# Patient Record
Sex: Male | Born: 1950 | Race: White | Hispanic: No | Marital: Single | Smoking: Former smoker
Health system: Southern US, Community
[De-identification: ages and names within clinical notes are randomized; demographics above are authoritative.]

## PROBLEM LIST (undated history)

## (undated) DIAGNOSIS — C801 Malignant (primary) neoplasm, unspecified: Secondary | ICD-10-CM

## (undated) DIAGNOSIS — I10 Essential (primary) hypertension: Secondary | ICD-10-CM

## (undated) DIAGNOSIS — Z972 Presence of dental prosthetic device (complete) (partial): Secondary | ICD-10-CM

## (undated) DIAGNOSIS — D473 Essential (hemorrhagic) thrombocythemia: Secondary | ICD-10-CM

## (undated) DIAGNOSIS — R251 Tremor, unspecified: Secondary | ICD-10-CM

## (undated) DIAGNOSIS — D75839 Thrombocytosis, unspecified: Secondary | ICD-10-CM

## (undated) DIAGNOSIS — N400 Enlarged prostate without lower urinary tract symptoms: Secondary | ICD-10-CM

## (undated) DIAGNOSIS — R591 Generalized enlarged lymph nodes: Secondary | ICD-10-CM

## (undated) DIAGNOSIS — D649 Anemia, unspecified: Principal | ICD-10-CM

## (undated) DIAGNOSIS — D72820 Lymphocytosis (symptomatic): Secondary | ICD-10-CM

## (undated) DIAGNOSIS — C9110 Chronic lymphocytic leukemia of B-cell type not having achieved remission: Secondary | ICD-10-CM

## (undated) HISTORY — DX: Anemia, unspecified: D64.9

## (undated) HISTORY — PX: TONSILLECTOMY: SUR1361

## (undated) HISTORY — DX: Malignant (primary) neoplasm, unspecified: C80.1

---

## 1988-04-01 HISTORY — PX: FRACTURE SURGERY: SHX138

## 1990-04-01 HISTORY — PX: HERNIA REPAIR: SHX51

## 1993-04-01 HISTORY — PX: ORIF ANKLE FRACTURE: SUR919

## 2009-11-30 ENCOUNTER — Emergency Department (HOSPITAL_COMMUNITY): Admission: EM | Admit: 2009-11-30 | Discharge: 2009-11-30 | Payer: Self-pay | Admitting: Family Medicine

## 2012-05-30 HISTORY — PX: PORTACATH PLACEMENT: SHX2246

## 2012-06-08 ENCOUNTER — Emergency Department (HOSPITAL_COMMUNITY): Payer: Medicaid Other

## 2012-06-08 ENCOUNTER — Emergency Department (HOSPITAL_COMMUNITY)
Admission: EM | Admit: 2012-06-08 | Discharge: 2012-06-08 | Disposition: A | Discharge status: Other Acute Inpatient Hospital | Payer: Medicaid Other | Attending: Emergency Medicine | Admitting: Emergency Medicine

## 2012-06-08 ENCOUNTER — Encounter (HOSPITAL_COMMUNITY): Payer: Self-pay | Admitting: *Deleted

## 2012-06-08 DIAGNOSIS — R3915 Urgency of urination: Secondary | ICD-10-CM | POA: Insufficient documentation

## 2012-06-08 DIAGNOSIS — Z87891 Personal history of nicotine dependence: Secondary | ICD-10-CM | POA: Insufficient documentation

## 2012-06-08 DIAGNOSIS — R059 Cough, unspecified: Secondary | ICD-10-CM | POA: Insufficient documentation

## 2012-06-08 DIAGNOSIS — C9590 Leukemia, unspecified not having achieved remission: Secondary | ICD-10-CM | POA: Insufficient documentation

## 2012-06-08 DIAGNOSIS — D649 Anemia, unspecified: Secondary | ICD-10-CM | POA: Insufficient documentation

## 2012-06-08 DIAGNOSIS — D696 Thrombocytopenia, unspecified: Secondary | ICD-10-CM | POA: Insufficient documentation

## 2012-06-08 LAB — COMPREHENSIVE METABOLIC PANEL
ALT: 11 U/L (ref 0–53)
AST: 31 U/L (ref 0–37)
Albumin: 4.3 g/dL (ref 3.5–5.2)
Alkaline Phosphatase: 104 U/L (ref 39–117)
BUN: 15 mg/dL (ref 6–23)
CO2: 24 mEq/L (ref 19–32)
Calcium: 9 mg/dL (ref 8.4–10.5)
Chloride: 105 mEq/L (ref 96–112)
Creatinine, Ser: 1.32 mg/dL (ref 0.50–1.35)
GFR calc Af Amer: 66 mL/min — ABNORMAL LOW (ref >90)
GFR calc non Af Amer: 57 mL/min — ABNORMAL LOW (ref >90)
Glucose, Bld: 70 mg/dL (ref 70–99)
Potassium: 5.2 mEq/L — ABNORMAL HIGH (ref 3.5–5.1)
Sodium: 143 mEq/L (ref 135–145)
Total Bilirubin: 1.3 mg/dL — ABNORMAL HIGH (ref 0.3–1.2)
Total Protein: 6.7 g/dL (ref 6.0–8.3)

## 2012-06-08 LAB — CBC WITH DIFFERENTIAL/PLATELET
Band Neutrophils: 0 % (ref 0–10)
Basophils Absolute: 0 10*3/uL (ref 0.0–0.1)
Basophils Relative: 0 % (ref 0–1)
Blasts: 46 %
Eosinophils Absolute: 16.7 10*3/uL — ABNORMAL HIGH (ref 0.0–0.7)
Eosinophils Relative: 2 % (ref 0–5)
HCT: 17.2 % — ABNORMAL LOW (ref 39.0–52.0)
Lymphocytes Relative: 50 % — ABNORMAL HIGH (ref 12–46)
Lymphs Abs: 416.4 10*3/uL — ABNORMAL HIGH (ref 0.7–4.0)
MCV: 165.4 fL — ABNORMAL HIGH (ref 78.0–100.0)
Metamyelocytes Relative: 0 %
Monocytes Absolute: 0 10*3/uL — ABNORMAL LOW (ref 0.1–1.0)
Monocytes Relative: 0 % — ABNORMAL LOW (ref 3–12)
Myelocytes: 0 %
Neutro Abs: 16.7 10*3/uL — ABNORMAL HIGH (ref 1.7–7.7)
Neutrophils Relative %: 2 % — ABNORMAL LOW (ref 43–77)
Platelets: 121 10*3/uL — ABNORMAL LOW (ref 150–400)
Promyelocytes Absolute: 0 %
RBC: 1.04 MIL/uL — ABNORMAL LOW (ref 4.22–5.81)
WBC: 832.8 10*3/uL (ref 4.0–10.5)
nRBC: 0 /100 WBC

## 2012-06-08 LAB — URINALYSIS, ROUTINE W REFLEX MICROSCOPIC
Bilirubin Urine: NEGATIVE
Glucose, UA: NEGATIVE mg/dL
Ketones, ur: NEGATIVE mg/dL
Leukocytes, UA: NEGATIVE
Nitrite: NEGATIVE
Protein, ur: 100 mg/dL — AB
Specific Gravity, Urine: 1.025 (ref 1.005–1.030)
Urobilinogen, UA: 0.2 mg/dL (ref 0.0–1.0)
pH: 5.5 (ref 5.0–8.0)

## 2012-06-08 LAB — APTT: aPTT: 32 seconds (ref 24–37)

## 2012-06-08 LAB — PRO B NATRIURETIC PEPTIDE: Pro B Natriuretic peptide (BNP): 1318 pg/mL — ABNORMAL HIGH (ref 0–125)

## 2012-06-08 LAB — URINE MICROSCOPIC-ADD ON

## 2012-06-08 LAB — OCCULT BLOOD, POC DEVICE: Fecal Occult Bld: POSITIVE — AB

## 2012-06-08 LAB — PROTIME-INR
INR: 1.31 (ref 0.00–1.49)
Prothrombin Time: 16 seconds — ABNORMAL HIGH (ref 11.6–15.2)

## 2012-06-08 LAB — TROPONIN I: Troponin I: <0.3 ng/mL (ref <0.30)

## 2012-06-08 MED ORDER — IOHEXOL 350 MG/ML SOLN
120.0000 mL | Freq: Once | INTRAVENOUS | Status: AC | PRN
  Administered 2012-06-08: 120 mL via INTRAVENOUS

## 2012-06-08 NOTE — ED Notes (Addendum)
Swelling both sides of neck for 4 mos,  Now has a lump  Lt axilla,   Sob, frequency of urination when tries to lie down.  Swelling of lt foot. abd appears distended,

## 2012-06-08 NOTE — ED Provider Notes (Signed)
History     This chart was scribed for Hadas Jessop B. Bernette Mayers, MD, MD by Smitty Pluck, ED Scribe. The patient was seen in room APA08/APA08 and the patient's care was started at 4:33PM.   CSN: [id number]  Arrival date & time 06/08/12  1408      Chief Complaint  Patient presents with  . Shortness of Breath    The history is provided by the patient and medical records. No language interpreter was used.   Tim Duncan is a 62 y.o. male who presents to the Emergency Department complaining of swollen glands in neck onset 2 months and in bilateral axilla ago 3-4 days ago. He reports that he has had SOB today. SOB is worse when he lays down. Pt states that he has nonproductive cough. He mentions having bilateral leg swelling that has been ongoing for over 5 months but symptoms have worsened. Pt reports that he has not been to PCP since 2010. He reports having increased urinary urgency at night when he gets in the bed. Pt denies fever, chills, nausea, vomiting, diarrhea, weakness and any other pain. He states that he has lost weight over the past couple of months. He reports hx of hernia surgery in 1990s. He denies drinking alcohol and smoking cigarettes.    History reviewed. No pertinent past medical history.  Past Surgical History  Procedure Laterality Date  . Hernia repair    . Fracture surgery      History reviewed. No pertinent family history.  History  Substance Use Topics  . Smoking status: Former Games developer  . Smokeless tobacco: Not on file  . Alcohol Use: No      Review of Systems 10 Systems reviewed and all are negative for acute change except as noted in the HPI.   Allergies  Review of patient's allergies indicates no known allergies.  Home Medications  No current outpatient prescriptions on file.  BP 155/69  Pulse 99  Temp(Src) 98 F (36.7 C) (Oral)  Resp 24  Ht 6' (1.829 m)  Wt 230 lb (104.327 kg)  BMI 31.19 kg/m2  SpO2 95%  Physical Exam  Nursing note and  vitals reviewed. Constitutional: He is oriented to person, place, and time. He appears well-developed and well-nourished.  Temporal wasting  Dry mouth   HENT:  Head: Normocephalic and atraumatic.  Eyes: EOM are normal. Pupils are equal, round, and reactive to light.  Neck: Normal range of motion. Neck supple.  Multiple large nontender lymph nodes   Cardiovascular: Normal rate, normal heart sounds and intact distal pulses.   Pulmonary/Chest: Breath sounds normal. No stridor.  Increased respiratory rate   Abdominal: Soft. Bowel sounds are normal. He exhibits mass. He exhibits no distension. There is hepatosplenomegaly. There is no tenderness.  Genitourinary:  Prostate exam chaperoned by RN   Musculoskeletal: Normal range of motion. He exhibits no tenderness.  +3 pitting edema bilaterally from knees down   Lymphadenopathy:    He has cervical adenopathy.  Neurological: He is alert and oriented to person, place, and time. He has normal strength. No cranial nerve deficit or sensory deficit.  Skin: Skin is warm and dry. No rash noted.  Psychiatric: He has a normal mood and affect.    ED Course  Procedures (including critical care time) DIAGNOSTIC STUDIES: Oxygen Saturation is 95% on room air, adequate by my interpretation.    COORDINATION OF CARE: 4:43 PM Discussed ED treatment with pt and pt agrees.     Labs Reviewed  CBC WITH  DIFFERENTIAL - Abnormal; Notable for the following:    WBC 832.8 (*)    RBC 1.04 (*)    HCT 17.2 (*)    MCV 165.4 (*)    Platelets 121 (*)    Neutrophils Relative 2 (*)    Lymphocytes Relative 50 (*)    Monocytes Relative 0 (*)    Neutro Abs 16.7 (*)    Lymphs Abs 416.4 (*)    Monocytes Absolute 0.0 (*)    Eosinophils Absolute 16.7 (*)    All other components within normal limits  COMPREHENSIVE METABOLIC PANEL - Abnormal; Notable for the following:    Potassium 5.2 (*)    Total Bilirubin 1.3 (*)    GFR calc non Af Amer 57 (*)    GFR calc Af  Amer 66 (*)    All other components within normal limits  URINALYSIS, ROUTINE W REFLEX MICROSCOPIC - Abnormal; Notable for the following:    Hgb urine dipstick SMALL (*)    Protein, ur 100 (*)    All other components within normal limits  PRO B NATRIURETIC PEPTIDE - Abnormal; Notable for the following:    Pro B Natriuretic peptide (BNP) 1318.0 (*)    All other components within normal limits  PROTIME-INR - Abnormal; Notable for the following:    Prothrombin Time 16.0 (*)    All other components within normal limits  URINE MICROSCOPIC-ADD ON - Abnormal; Notable for the following:    Bacteria, UA FEW (*)    All other components within normal limits  OCCULT BLOOD, POC DEVICE - Abnormal; Notable for the following:    Fecal Occult Bld POSITIVE (*)    All other components within normal limits  URINE CULTURE  TROPONIN I  APTT  PATHOLOGIST SMEAR REVIEW   Dg Chest 2 View  06/08/2012  *RADIOLOGY REPORT*  Clinical Data: Shortness of breath.  Nodules under both arms and both sides of the neck.  CHEST - 2 VIEW  Comparison: Chest x-ray 11/30/2009  Findings: Heart is enlarged.  There is patchy infiltrate at the lung bases bilaterally, right greater than left.  Left pleural effusion is present.  IMPRESSION:  1.  Bilateral pulmonary infiltrates, right greater than left. Findings favor infectious process overt pulmonary edema. 2.  Cardiomegaly.   Original Report Authenticated By: Norva Pavlov, M.D.    Ct Head Wo Contrast  06/08/2012  *RADIOLOGY REPORT*  Clinical Data: Lymphoma.  Question metastatic disease.  Prominent lymph nodes and shortness of breath.  CT HEAD WITHOUT CONTRAST  Technique:  Contiguous axial images were obtained from the base of the skull through the vertex without contrast.  Comparison: None.  Findings: Prominent posterior cervical lymph nodes are present. There are prominent high density nodules within the parotid glands as well.  No acute intracranial abnormalities present.  No acute  cortical infarct, hemorrhage, or mass lesion is present.  The ventricles are normal size.  No significant extra-axial fluid collection is present.  The paranasal sinuses and mastoid air cells are clear.  The osseous skull is intact.  IMPRESSION:  1.  Normal CT appearance of the brain. 2.  Extensive posterior cervical adenopathy is compatible with a lymphoproliferative disorder such as lymphoma. 3.  High density lesions within the parotid glands bilaterally likely represent lymph nodes.  HIV or Sjogren's are considered less likely as these do not appear to be cystic.   Original Report Authenticated By: Marin Roberts, M.D.      1. Leukemia   2. Anemia   3.  Thrombocytopenia       MDM  Pt with marked leukocytosis anemia and thrombocytopenia. 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    CRITICAL CARE Performed by: Pollyann Savoy.   Total critical care time: 45  Critical care time was exclusive of separately billable procedures and treating other patients.  Critical care was necessary to treat or prevent imminent or life-threatening deterioration.  Critical care was time spent personally by me on the following activities: development of treatment plan with patient and/or surrogate as well as nursing, discussions with consultants, evaluation of patient's response to treatment, examination of patient, obtaining history from patient or surrogate, ordering and performing treatments and interventions, ordering and review of laboratory studies, ordering and review of radiographic studies, pulse oximetry and re-evaluation of patient's condition.   I personally performed the services described in this documentation, which was scribed in my presence. The recorded information has been reviewed and is accurate.    Karmela Bram B. Bernette Mayers, MD 06/09/12 [phone]

## 2012-06-09 LAB — URINE CULTURE
Colony Count: NO GROWTH
Culture: NO GROWTH

## 2012-06-09 LAB — PATHOLOGIST SMEAR REVIEW

## 2012-06-09 NOTE — ED Provider Notes (Signed)
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

## 2012-06-16 DIAGNOSIS — L989 Disorder of the skin and subcutaneous tissue, unspecified: Secondary | ICD-10-CM | POA: Insufficient documentation

## 2012-07-01 DIAGNOSIS — I313 Pericardial effusion (noninflammatory): Secondary | ICD-10-CM | POA: Insufficient documentation

## 2012-07-15 ENCOUNTER — Encounter (HOSPITAL_COMMUNITY): Payer: 59

## 2012-07-15 ENCOUNTER — Other Ambulatory Visit: Payer: Self-pay | Admitting: Oncology

## 2012-07-15 ENCOUNTER — Encounter (HOSPITAL_COMMUNITY): Payer: Medicaid Other | Attending: Oncology

## 2012-07-15 ENCOUNTER — Other Ambulatory Visit (HOSPITAL_COMMUNITY): Payer: Self-pay | Admitting: *Deleted

## 2012-07-15 DIAGNOSIS — D649 Anemia, unspecified: Secondary | ICD-10-CM | POA: Insufficient documentation

## 2012-07-15 NOTE — Progress Notes (Signed)
Labs drawn today for cbc/diff,cmp,mg,phos,ldh,uric acid

## 2012-07-22 ENCOUNTER — Encounter (HOSPITAL_BASED_OUTPATIENT_CLINIC_OR_DEPARTMENT_OTHER): Payer: Medicaid Other

## 2012-07-22 ENCOUNTER — Other Ambulatory Visit: Payer: Self-pay | Admitting: Oncology

## 2012-07-22 ENCOUNTER — Encounter (HOSPITAL_COMMUNITY): Payer: 59

## 2012-07-22 DIAGNOSIS — D649 Anemia, unspecified: Secondary | ICD-10-CM

## 2012-07-22 NOTE — Progress Notes (Signed)
CRITICAL VALUE ALERT Critical value received:  WBC 179.5 Date of notification:  07/22/12 Time of notification: 1230 Critical value read back:  yes Nurse who received alert:  T.Floy Riegler,RN MD notified (1st page):  [phone]

## 2012-07-22 NOTE — Progress Notes (Signed)
Labs drawn today for cbc/diff,cmp,mg,phos,ldh,uric acid 

## 2012-07-27 ENCOUNTER — Encounter (HOSPITAL_BASED_OUTPATIENT_CLINIC_OR_DEPARTMENT_OTHER): Payer: Medicaid Other

## 2012-07-27 VITALS — BP 115/66 | HR 82 | Temp 98.5°F | Resp 16

## 2012-07-27 DIAGNOSIS — D649 Anemia, unspecified: Secondary | ICD-10-CM

## 2012-07-27 LAB — CBC
HCT: 24.7 % — ABNORMAL LOW (ref 39.0–52.0)
Hemoglobin: 7.6 g/dL — ABNORMAL LOW (ref 13.0–17.0)
MCH: 30.8 pg (ref 26.0–34.0)
MCHC: 30.8 g/dL (ref 30.0–36.0)
MCV: 100 fL (ref 78.0–100.0)
Platelets: 182 10*3/uL (ref 150–400)
RBC: 2.47 MIL/uL — ABNORMAL LOW (ref 4.22–5.81)
RDW: 23.1 % — ABNORMAL HIGH (ref 11.5–15.5)
WBC: 198.6 10*3/uL (ref 4.0–10.5)

## 2012-07-27 LAB — ABO/RH: ABO/RH(D): A POS

## 2012-07-27 LAB — PREPARE RBC (CROSSMATCH)

## 2012-07-27 MED ORDER — SODIUM CHLORIDE 0.9 % IV SOLN
250.0000 mL | Freq: Once | INTRAVENOUS | Status: AC
  Administered 2012-07-27: 250 mL via INTRAVENOUS

## 2012-07-27 MED ORDER — HEPARIN SOD (PORK) LOCK FLUSH 100 UNIT/ML IV SOLN
500.0000 [IU] | Freq: Every day | INTRAVENOUS | Status: AC | PRN
  Administered 2012-07-27: 500 [IU]
  Filled 2012-07-27: qty 5

## 2012-07-27 MED ORDER — HEPARIN SOD (PORK) LOCK FLUSH 100 UNIT/ML IV SOLN
INTRAVENOUS | Status: AC
  Filled 2012-07-27: qty 5

## 2012-07-27 NOTE — Addendum Note (Signed)
Addended byLeida Lauth on: 07/27/2012 08:45 AM   Modules accepted: Orders

## 2012-07-27 NOTE — Progress Notes (Signed)
Labs drawn today for type and cross 

## 2012-07-27 NOTE — Progress Notes (Signed)
Tolerated well

## 2012-07-28 LAB — TYPE AND SCREEN
ABO/RH(D): A POS
Antibody Screen: NEGATIVE
Unit division: 0

## 2012-07-29 ENCOUNTER — Encounter (HOSPITAL_COMMUNITY): Payer: Medicaid Other

## 2012-07-29 ENCOUNTER — Encounter (HOSPITAL_COMMUNITY): Payer: 59

## 2012-07-30 ENCOUNTER — Encounter (HOSPITAL_COMMUNITY): Payer: Medicaid Other | Attending: Oncology

## 2012-07-30 ENCOUNTER — Other Ambulatory Visit: Payer: Self-pay | Admitting: Oncology

## 2012-07-30 ENCOUNTER — Telehealth (HOSPITAL_COMMUNITY): Payer: Self-pay | Admitting: *Deleted

## 2012-07-30 DIAGNOSIS — D649 Anemia, unspecified: Secondary | ICD-10-CM | POA: Insufficient documentation

## 2012-07-30 LAB — PREPARE RBC (CROSSMATCH)

## 2012-07-30 NOTE — Addendum Note (Signed)
Addended by: Dennie Maizes on: 07/30/2012 03:58 PM   Modules accepted: Kipp Brood

## 2012-07-30 NOTE — Telephone Encounter (Signed)
.  CRITICAL VALUE ALERT Critical value received:  WBC > 50,000 Date of notification:  07/30/2012 Time of notification: 1315 Critical value read back:  yes Nurse who received alert:  Loma Newton RN MD notified (1st page):  Fax to [employer]

## 2012-07-30 NOTE — Addendum Note (Signed)
Addended by: Edythe Lynn A on: 07/30/2012 04:02 PM   Modules accepted: Orders, SmartSet

## 2012-07-30 NOTE — Progress Notes (Signed)
Labs drawn today for cbc/diff,cmp,mg,phos,ldh,uric acid 

## 2012-07-30 NOTE — Addendum Note (Signed)
Addended by: Edythe Lynn A on: 07/30/2012 04:35 PM   Modules accepted: Orders

## 2012-07-30 NOTE — Progress Notes (Signed)
Labs called and faxed to [facility] and orders received for transfusion of 1 unit prbc. Patient called and left message to come tomorrow for transfusion.

## 2012-07-31 ENCOUNTER — Encounter (HOSPITAL_COMMUNITY): Payer: Medicaid Other | Attending: Oncology

## 2012-07-31 VITALS — BP 125/56 | HR 78 | Temp 99.0°F | Resp 18

## 2012-07-31 DIAGNOSIS — C9110 Chronic lymphocytic leukemia of B-cell type not having achieved remission: Secondary | ICD-10-CM | POA: Insufficient documentation

## 2012-07-31 DIAGNOSIS — T451X5A Adverse effect of antineoplastic and immunosuppressive drugs, initial encounter: Secondary | ICD-10-CM | POA: Insufficient documentation

## 2012-07-31 DIAGNOSIS — D649 Anemia, unspecified: Secondary | ICD-10-CM | POA: Insufficient documentation

## 2012-07-31 DIAGNOSIS — D6481 Anemia due to antineoplastic chemotherapy: Secondary | ICD-10-CM | POA: Insufficient documentation

## 2012-07-31 LAB — COMPREHENSIVE METABOLIC PANEL
ALT: 8 U/L (ref 0–53)
ALT: 7 U/L (ref 0–53)
ALT: 5 U/L (ref 0–53)
AST: 22 U/L (ref 0–37)
AST: 21 U/L (ref 0–37)
AST: 14 U/L (ref 0–37)
Albumin: 3.6 g/dL (ref 3.5–5.2)
Albumin: 4.2 g/dL (ref 3.5–5.2)
Albumin: 3.7 g/dL (ref 3.5–5.2)
Alkaline Phosphatase: 62 U/L (ref 39–117)
Alkaline Phosphatase: 76 U/L (ref 39–117)
Alkaline Phosphatase: 78 U/L (ref 39–117)
BUN: 24 mg/dL — ABNORMAL HIGH (ref 6–23)
BUN: 25 mg/dL — ABNORMAL HIGH (ref 6–23)
BUN: 16 mg/dL (ref 6–23)
CO2: 30 mEq/L (ref 19–32)
CO2: 28 mEq/L (ref 19–32)
CO2: 23 mEq/L (ref 19–32)
Calcium: 8.1 mg/dL — ABNORMAL LOW (ref 8.4–10.5)
Calcium: 8.8 mg/dL (ref 8.4–10.5)
Calcium: 8 mg/dL — ABNORMAL LOW (ref 8.4–10.5)
Chloride: 102 mEq/L (ref 96–112)
Chloride: 102 mEq/L (ref 96–112)
Chloride: 98 mEq/L (ref 96–112)
Creatinine, Ser: 1.06 mg/dL (ref 0.50–1.35)
Creatinine, Ser: 1.02 mg/dL (ref 0.50–1.35)
Creatinine, Ser: 0.89 mg/dL (ref 0.50–1.35)
GFR calc Af Amer: 86 mL/min — ABNORMAL LOW (ref >90)
GFR calc Af Amer: >90 mL/min (ref >90)
GFR calc Af Amer: >90 mL/min (ref >90)
GFR calc non Af Amer: 74 mL/min — ABNORMAL LOW (ref >90)
GFR calc non Af Amer: 78 mL/min — ABNORMAL LOW (ref >90)
GFR calc non Af Amer: >90 mL/min (ref >90)
Glucose, Bld: 89 mg/dL (ref 70–99)
Glucose, Bld: 90 mg/dL (ref 70–99)
Glucose, Bld: 118 mg/dL — ABNORMAL HIGH (ref 70–99)
Potassium: 4.4 mEq/L (ref 3.5–5.1)
Potassium: 4.2 mEq/L (ref 3.5–5.1)
Potassium: 4 mEq/L (ref 3.5–5.1)
Sodium: 142 mEq/L (ref 135–145)
Sodium: 142 mEq/L (ref 135–145)
Sodium: 135 mEq/L (ref 135–145)
Total Bilirubin: 0.5 mg/dL (ref 0.3–1.2)
Total Bilirubin: 0.8 mg/dL (ref 0.3–1.2)
Total Bilirubin: 0.4 mg/dL (ref 0.3–1.2)
Total Protein: 6.3 g/dL (ref 6.0–8.3)
Total Protein: 7 g/dL (ref 6.0–8.3)
Total Protein: 6.7 g/dL (ref 6.0–8.3)

## 2012-07-31 LAB — CBC
HCT: 21.6 % — ABNORMAL LOW (ref 39.0–52.0)
HCT: 26.9 % — ABNORMAL LOW (ref 39.0–52.0)
HCT: 25.6 % — ABNORMAL LOW (ref 39.0–52.0)
Hemoglobin: 6.5 g/dL — CL (ref 13.0–17.0)
Hemoglobin: 8.2 g/dL — ABNORMAL LOW (ref 13.0–17.0)
Hemoglobin: 8.1 g/dL — ABNORMAL LOW (ref 13.0–17.0)
MCH: 30.5 pg (ref 26.0–34.0)
MCH: 30.5 pg (ref 26.0–34.0)
MCH: 30.9 pg (ref 26.0–34.0)
MCHC: 30.1 g/dL (ref 30.0–36.0)
MCHC: 30.5 g/dL (ref 30.0–36.0)
MCHC: 31.6 g/dL (ref 30.0–36.0)
MCV: 101.4 fL — ABNORMAL HIGH (ref 78.0–100.0)
MCV: 100 fL (ref 78.0–100.0)
MCV: 97.7 fL (ref 78.0–100.0)
Platelets: 145 10*3/uL — ABNORMAL LOW (ref 150–400)
Platelets: 195 10*3/uL (ref 150–400)
Platelets: 154 10*3/uL (ref 150–400)
RBC: 2.13 MIL/uL — ABNORMAL LOW (ref 4.22–5.81)
RBC: 2.69 MIL/uL — ABNORMAL LOW (ref 4.22–5.81)
RBC: 2.62 MIL/uL — ABNORMAL LOW (ref 4.22–5.81)
RDW: 22.6 % — ABNORMAL HIGH (ref 11.5–15.5)
RDW: 22.7 % — ABNORMAL HIGH (ref 11.5–15.5)
RDW: 22.2 % — ABNORMAL HIGH (ref 11.5–15.5)
WBC: 158.8 10*3/uL (ref 4.0–10.5)
WBC: 179.5 10*3/uL (ref 4.0–10.5)
WBC: 189.9 10*3/uL (ref 4.0–10.5)

## 2012-07-31 LAB — DIFFERENTIAL
Band Neutrophils: 0 % (ref 0–10)
Band Neutrophils: 0 % (ref 0–10)
Basophils Absolute: 0.8 10*3/uL — ABNORMAL HIGH (ref 0.0–0.1)
Basophils Absolute: 0 10*3/uL (ref 0.0–0.1)
Basophils Absolute: 0 10*3/uL (ref 0.0–0.1)
Basophils Relative: 1 % (ref 0–1)
Basophils Relative: 0 % (ref 0–1)
Basophils Relative: 0 % (ref 0–1)
Blasts: 0 %
Blasts: 3 %
Eosinophils Absolute: 0.5 10*3/uL (ref 0.0–0.7)
Eosinophils Absolute: 0 10*3/uL (ref 0.0–0.7)
Eosinophils Absolute: 0 10*3/uL (ref 0.0–0.7)
Eosinophils Relative: 0 % (ref 0–5)
Eosinophils Relative: 0 % (ref 0–5)
Eosinophils Relative: 0 % (ref 0–5)
Lymphocytes Relative: 100 % — ABNORMAL HIGH (ref 12–46)
Lymphocytes Relative: 96 % — ABNORMAL HIGH (ref 12–46)
Lymphocytes Relative: 92 % — ABNORMAL HIGH (ref 12–46)
Lymphs Abs: 15.8 10*3/uL — ABNORMAL HIGH (ref 0.7–4.0)
Lymphs Abs: 172.3 10*3/uL — ABNORMAL HIGH (ref 0.7–4.0)
Lymphs Abs: 174.7 10*3/uL — ABNORMAL HIGH (ref 0.7–4.0)
Metamyelocytes Relative: 0 %
Metamyelocytes Relative: 0 %
Monocytes Absolute: 0 10*3/uL — ABNORMAL LOW (ref 0.1–1.0)
Monocytes Absolute: 0 10*3/uL — ABNORMAL LOW (ref 0.1–1.0)
Monocytes Absolute: 0 10*3/uL — ABNORMAL LOW (ref 0.1–1.0)
Monocytes Relative: 0 % — ABNORMAL LOW (ref 3–12)
Monocytes Relative: 0 % — ABNORMAL LOW (ref 3–12)
Monocytes Relative: 0 % — ABNORMAL LOW (ref 3–12)
Myelocytes: 0 %
Myelocytes: 0 %
Neutro Abs: 0 10*3/uL — ABNORMAL LOW (ref 1.7–7.7)
Neutro Abs: 7.2 10*3/uL (ref 1.7–7.7)
Neutro Abs: 9.5 10*3/uL — ABNORMAL HIGH (ref 1.7–7.7)
Neutrophils Relative %: 0 % — ABNORMAL LOW (ref 43–77)
Neutrophils Relative %: 4 % — ABNORMAL LOW (ref 43–77)
Neutrophils Relative %: 5 % — ABNORMAL LOW (ref 43–77)
Promyelocytes Absolute: 0 %
Promyelocytes Absolute: 0 %
nRBC: 0 /100 WBC
nRBC: 0 /100 WBC

## 2012-07-31 LAB — MAGNESIUM
Magnesium: 2 mg/dL (ref 1.5–2.5)
Magnesium: 2.6 mg/dL — ABNORMAL HIGH (ref 1.5–2.5)
Magnesium: 2.6 mg/dL — ABNORMAL HIGH (ref 1.5–2.5)

## 2012-07-31 LAB — URIC ACID
Uric Acid, Serum: 8.2 mg/dL — ABNORMAL HIGH (ref 4.0–7.8)
Uric Acid, Serum: 7.8 mg/dL (ref 4.0–7.8)
Uric Acid, Serum: 7.4 mg/dL (ref 4.0–7.8)

## 2012-07-31 LAB — LACTATE DEHYDROGENASE
LDH: 325 U/L — ABNORMAL HIGH (ref 94–250)
LDH: 388 U/L — ABNORMAL HIGH (ref 94–250)
LDH: 311 U/L — ABNORMAL HIGH (ref 94–250)

## 2012-07-31 LAB — PHOSPHORUS
Phosphorus: 5 mg/dL — ABNORMAL HIGH (ref 2.3–4.6)
Phosphorus: 4.8 mg/dL — ABNORMAL HIGH (ref 2.3–4.6)
Phosphorus: 4.3 mg/dL (ref 2.3–4.6)

## 2012-07-31 MED ORDER — HEPARIN SOD (PORK) LOCK FLUSH 100 UNIT/ML IV SOLN
500.0000 [IU] | Freq: Every day | INTRAVENOUS | Status: AC | PRN
  Administered 2012-07-31: 500 [IU]
  Filled 2012-07-31: qty 5

## 2012-07-31 MED ORDER — HEPARIN SOD (PORK) LOCK FLUSH 100 UNIT/ML IV SOLN
INTRAVENOUS | Status: AC
  Filled 2012-07-31: qty 5

## 2012-07-31 MED ORDER — SODIUM CHLORIDE 0.9 % IV SOLN
250.0000 mL | Freq: Once | INTRAVENOUS | Status: AC
  Administered 2012-07-31: 250 mL via INTRAVENOUS

## 2012-07-31 NOTE — Progress Notes (Signed)
Tolerated well

## 2012-08-01 LAB — TYPE AND SCREEN
ABO/RH(D): A POS
Antibody Screen: NEGATIVE
Unit division: 0

## 2012-08-05 ENCOUNTER — Encounter (HOSPITAL_COMMUNITY): Payer: Medicaid Other

## 2012-08-11 ENCOUNTER — Encounter (HOSPITAL_COMMUNITY): Payer: Medicaid Other

## 2012-08-11 DIAGNOSIS — C9110 Chronic lymphocytic leukemia of B-cell type not having achieved remission: Secondary | ICD-10-CM

## 2012-08-11 LAB — MAGNESIUM: Magnesium: 1.9 mg/dL (ref 1.5–2.5)

## 2012-08-11 LAB — CBC WITH DIFFERENTIAL/PLATELET
Band Neutrophils: 0 % (ref 0–10)
Basophils Absolute: 0 10*3/uL (ref 0.0–0.1)
Basophils Relative: 0 % (ref 0–1)
Blasts: 0 %
Eosinophils Absolute: 0 10*3/uL (ref 0.0–0.7)
Eosinophils Relative: 0 % (ref 0–5)
HCT: 27 % — ABNORMAL LOW (ref 39.0–52.0)
Hemoglobin: 8.8 g/dL — ABNORMAL LOW (ref 13.0–17.0)
Lymphocytes Relative: 94 % — ABNORMAL HIGH (ref 12–46)
Lymphs Abs: 186.4 10*3/uL — ABNORMAL HIGH (ref 0.7–4.0)
MCH: 31.7 pg (ref 26.0–34.0)
MCHC: 32.6 g/dL (ref 30.0–36.0)
MCV: 97.1 fL (ref 78.0–100.0)
Metamyelocytes Relative: 0 %
Monocytes Absolute: 6 10*3/uL — ABNORMAL HIGH (ref 0.1–1.0)
Monocytes Relative: 3 % (ref 3–12)
Myelocytes: 0 %
Neutro Abs: 6 10*3/uL (ref 1.7–7.7)
Neutrophils Relative %: 3 % — ABNORMAL LOW (ref 43–77)
Platelets: 201 10*3/uL (ref 150–400)
Promyelocytes Absolute: 0 %
RBC: 2.78 MIL/uL — ABNORMAL LOW (ref 4.22–5.81)
RDW: 21.2 % — ABNORMAL HIGH (ref 11.5–15.5)
WBC: 198.4 10*3/uL (ref 4.0–10.5)
nRBC: 0 /100 WBC

## 2012-08-11 LAB — COMPREHENSIVE METABOLIC PANEL
ALT: 7 U/L (ref 0–53)
AST: 19 U/L (ref 0–37)
Albumin: 3.9 g/dL (ref 3.5–5.2)
Alkaline Phosphatase: 70 U/L (ref 39–117)
BUN: 20 mg/dL (ref 6–23)
CO2: 29 mEq/L (ref 19–32)
Calcium: 9.4 mg/dL (ref 8.4–10.5)
Chloride: 98 mEq/L (ref 96–112)
Creatinine, Ser: 0.77 mg/dL (ref 0.50–1.35)
GFR calc Af Amer: >90 mL/min (ref >90)
GFR calc non Af Amer: >90 mL/min (ref >90)
Glucose, Bld: 122 mg/dL — ABNORMAL HIGH (ref 70–99)
Potassium: 5.3 mEq/L — ABNORMAL HIGH (ref 3.5–5.1)
Sodium: 138 mEq/L (ref 135–145)
Total Bilirubin: 0.6 mg/dL (ref 0.3–1.2)
Total Protein: 6.7 g/dL (ref 6.0–8.3)

## 2012-08-11 LAB — LACTATE DEHYDROGENASE: LDH: 350 U/L — ABNORMAL HIGH (ref 94–250)

## 2012-08-13 ENCOUNTER — Encounter (HOSPITAL_COMMUNITY): Payer: Self-pay

## 2012-08-13 ENCOUNTER — Other Ambulatory Visit (HOSPITAL_COMMUNITY): Payer: Self-pay

## 2012-08-18 ENCOUNTER — Encounter (HOSPITAL_BASED_OUTPATIENT_CLINIC_OR_DEPARTMENT_OTHER): Payer: Medicaid Other

## 2012-08-18 DIAGNOSIS — D649 Anemia, unspecified: Secondary | ICD-10-CM

## 2012-08-18 LAB — DIFFERENTIAL
Band Neutrophils: 0 % (ref 0–10)
Basophils Absolute: 0 10*3/uL (ref 0.0–0.1)
Basophils Relative: 0 % (ref 0–1)
Blasts: 0 %
Eosinophils Absolute: 4.1 10*3/uL — ABNORMAL HIGH (ref 0.0–0.7)
Eosinophils Relative: 2 % (ref 0–5)
Lymphocytes Relative: 92 % — ABNORMAL HIGH (ref 12–46)
Lymphs Abs: 189.3 10*3/uL — ABNORMAL HIGH (ref 0.7–4.0)
Metamyelocytes Relative: 0 %
Monocytes Absolute: 0 10*3/uL — ABNORMAL LOW (ref 0.1–1.0)
Monocytes Relative: 0 % — ABNORMAL LOW (ref 3–12)
Myelocytes: 0 %
Neutro Abs: 12.3 10*3/uL — ABNORMAL HIGH (ref 1.7–7.7)
Neutrophils Relative %: 6 % — ABNORMAL LOW (ref 43–77)
Promyelocytes Absolute: 0 %
nRBC: 0 /100 WBC

## 2012-08-18 LAB — MAGNESIUM: Magnesium: 2.6 mg/dL — ABNORMAL HIGH (ref 1.5–2.5)

## 2012-08-18 LAB — CBC
HCT: 26.6 % — ABNORMAL LOW (ref 39.0–52.0)
Hemoglobin: 8 g/dL — ABNORMAL LOW (ref 13.0–17.0)
MCH: 30.5 pg (ref 26.0–34.0)
MCHC: 30.1 g/dL (ref 30.0–36.0)
MCV: 101.5 fL — ABNORMAL HIGH (ref 78.0–100.0)
Platelets: 248 10*3/uL (ref 150–400)
RBC: 2.62 MIL/uL — ABNORMAL LOW (ref 4.22–5.81)
RDW: 22 % — ABNORMAL HIGH (ref 11.5–15.5)
WBC: 205.7 10*3/uL (ref 4.0–10.5)

## 2012-08-18 LAB — URIC ACID: Uric Acid, Serum: 7.9 mg/dL — ABNORMAL HIGH (ref 4.0–7.8)

## 2012-08-18 LAB — COMPREHENSIVE METABOLIC PANEL
ALT: 6 U/L (ref 0–53)
AST: 14 U/L (ref 0–37)
Albumin: 3.5 g/dL (ref 3.5–5.2)
Alkaline Phosphatase: 67 U/L (ref 39–117)
BUN: 21 mg/dL (ref 6–23)
CO2: 27 mEq/L (ref 19–32)
Calcium: 8.2 mg/dL — ABNORMAL LOW (ref 8.4–10.5)
Chloride: 97 mEq/L (ref 96–112)
Creatinine, Ser: 0.91 mg/dL (ref 0.50–1.35)
GFR calc Af Amer: >90 mL/min (ref >90)
GFR calc non Af Amer: >90 mL/min (ref >90)
Glucose, Bld: 111 mg/dL — ABNORMAL HIGH (ref 70–99)
Potassium: 4 mEq/L (ref 3.5–5.1)
Sodium: 135 mEq/L (ref 135–145)
Total Bilirubin: 0.4 mg/dL (ref 0.3–1.2)
Total Protein: 6.5 g/dL (ref 6.0–8.3)

## 2012-08-18 LAB — LACTATE DEHYDROGENASE: LDH: 320 U/L — ABNORMAL HIGH (ref 94–250)

## 2012-08-18 LAB — PHOSPHORUS: Phosphorus: 4.1 mg/dL (ref 2.3–4.6)

## 2012-08-18 NOTE — Progress Notes (Addendum)
CRITICAL VALUE ALERT Critical value received:  WBC 205.7 Date of notification:  08/18/2012  Time of notification: 11:48 AM Critical value read back:  yes Nurse who received alert:  Payton Doughty, RN MD notified (1st page):    08/18/2012 11:56 AM WBC and Hgb reported to Ro[place], RN with Hem/Onc's Physician Access Line - she is relaying values to Dr. Rennis Harding.  No further follow-up given at present.  Labs faxed to [phone].

## 2012-08-18 NOTE — Progress Notes (Signed)
Labs drawn today for cbc/diff,cmp,mg,ldh,uric acid,phosphorous

## 2012-08-19 ENCOUNTER — Other Ambulatory Visit (HOSPITAL_COMMUNITY): Payer: Self-pay

## 2012-08-20 ENCOUNTER — Encounter (HOSPITAL_COMMUNITY): Payer: Self-pay

## 2012-08-25 ENCOUNTER — Encounter (HOSPITAL_BASED_OUTPATIENT_CLINIC_OR_DEPARTMENT_OTHER): Payer: Medicaid Other

## 2012-08-25 DIAGNOSIS — D6481 Anemia due to antineoplastic chemotherapy: Secondary | ICD-10-CM

## 2012-08-25 DIAGNOSIS — T451X5A Adverse effect of antineoplastic and immunosuppressive drugs, initial encounter: Secondary | ICD-10-CM

## 2012-08-25 DIAGNOSIS — D649 Anemia, unspecified: Secondary | ICD-10-CM

## 2012-08-25 LAB — DIFFERENTIAL
Band Neutrophils: 0 % (ref 0–10)
Basophils Absolute: 0 10*3/uL (ref 0.0–0.1)
Basophils Relative: 0 % (ref 0–1)
Blasts: 0 %
Eosinophils Absolute: 2.7 10*3/uL — ABNORMAL HIGH (ref 0.0–0.7)
Eosinophils Relative: 1 % (ref 0–5)
Lymphocytes Relative: 91 % — ABNORMAL HIGH (ref 12–46)
Lymphs Abs: 241.5 10*3/uL — ABNORMAL HIGH (ref 0.7–4.0)
Metamyelocytes Relative: 0 %
Monocytes Absolute: 0 10*3/uL — ABNORMAL LOW (ref 0.1–1.0)
Monocytes Relative: 0 % — ABNORMAL LOW (ref 3–12)
Myelocytes: 0 %
Neutro Abs: 21.2 10*3/uL — ABNORMAL HIGH (ref 1.7–7.7)
Neutrophils Relative %: 8 % — ABNORMAL LOW (ref 43–77)
Promyelocytes Absolute: 0 %
nRBC: 0 /100 WBC

## 2012-08-25 LAB — CBC
HCT: 25.1 % — ABNORMAL LOW (ref 39.0–52.0)
Hemoglobin: 7.6 g/dL — ABNORMAL LOW (ref 13.0–17.0)
MCH: 31 pg (ref 26.0–34.0)
MCHC: 30.3 g/dL (ref 30.0–36.0)
MCV: 102.4 fL — ABNORMAL HIGH (ref 78.0–100.0)
Platelets: 301 10*3/uL (ref 150–400)
RBC: 2.45 MIL/uL — ABNORMAL LOW (ref 4.22–5.81)
RDW: 21.1 % — ABNORMAL HIGH (ref 11.5–15.5)
WBC: 265.4 10*3/uL (ref 4.0–10.5)

## 2012-08-25 LAB — COMPREHENSIVE METABOLIC PANEL
ALT: 5 U/L (ref 0–53)
AST: 15 U/L (ref 0–37)
Albumin: 3.5 g/dL (ref 3.5–5.2)
Alkaline Phosphatase: 67 U/L (ref 39–117)
BUN: 14 mg/dL (ref 6–23)
CO2: 26 mEq/L (ref 19–32)
Calcium: 7.7 mg/dL — ABNORMAL LOW (ref 8.4–10.5)
Chloride: 97 mEq/L (ref 96–112)
Creatinine, Ser: 0.86 mg/dL (ref 0.50–1.35)
GFR calc Af Amer: >90 mL/min (ref >90)
GFR calc non Af Amer: >90 mL/min (ref >90)
Glucose, Bld: 96 mg/dL (ref 70–99)
Potassium: 3.7 mEq/L (ref 3.5–5.1)
Sodium: 135 mEq/L (ref 135–145)
Total Bilirubin: 0.5 mg/dL (ref 0.3–1.2)
Total Protein: 6.5 g/dL (ref 6.0–8.3)

## 2012-08-25 LAB — URIC ACID: Uric Acid, Serum: 5.3 mg/dL (ref 4.0–7.8)

## 2012-08-25 LAB — PREPARE RBC (CROSSMATCH)

## 2012-08-25 LAB — PHOSPHORUS: Phosphorus: 3.3 mg/dL (ref 2.3–4.6)

## 2012-08-25 LAB — MAGNESIUM: Magnesium: 2.2 mg/dL (ref 1.5–2.5)

## 2012-08-25 LAB — LACTATE DEHYDROGENASE: LDH: 298 U/L — ABNORMAL HIGH (ref 94–250)

## 2012-08-25 NOTE — Progress Notes (Signed)
Labs drawn today for cbc/diff,cmp,mg,ldh,uric acid,phos,  Type and Cross

## 2012-08-26 ENCOUNTER — Encounter (HOSPITAL_BASED_OUTPATIENT_CLINIC_OR_DEPARTMENT_OTHER): Payer: Medicaid Other

## 2012-08-26 ENCOUNTER — Encounter (HOSPITAL_COMMUNITY): Payer: Self-pay

## 2012-08-26 VITALS — BP 120/60 | HR 86 | Temp 98.5°F | Resp 16

## 2012-08-26 DIAGNOSIS — D6481 Anemia due to antineoplastic chemotherapy: Secondary | ICD-10-CM

## 2012-08-26 DIAGNOSIS — D649 Anemia, unspecified: Secondary | ICD-10-CM

## 2012-08-26 HISTORY — DX: Anemia, unspecified: D64.9

## 2012-08-26 MED ORDER — HEPARIN SOD (PORK) LOCK FLUSH 100 UNIT/ML IV SOLN
INTRAVENOUS | Status: AC
  Filled 2012-08-26: qty 5

## 2012-08-26 MED ORDER — HEPARIN SOD (PORK) LOCK FLUSH 100 UNIT/ML IV SOLN
500.0000 [IU] | Freq: Once | INTRAVENOUS | Status: AC
  Administered 2012-08-26: 500 [IU] via INTRAVENOUS
  Filled 2012-08-26: qty 5

## 2012-08-26 MED ORDER — SODIUM CHLORIDE 0.9 % IV SOLN
INTRAVENOUS | Status: DC
  Administered 2012-08-26: 10:00:00 via INTRAVENOUS

## 2012-08-26 MED ORDER — SODIUM CHLORIDE 0.9 % IJ SOLN
10.0000 mL | INTRAMUSCULAR | Status: DC | PRN
  Administered 2012-08-26: 10 mL via INTRAVENOUS
  Filled 2012-08-26: qty 10

## 2012-08-26 NOTE — Progress Notes (Signed)
Tolerated transfusion well. 

## 2012-08-27 LAB — TYPE AND SCREEN
ABO/RH(D): A POS
Antibody Screen: NEGATIVE
Unit division: 0
Unit division: 0

## 2012-09-01 ENCOUNTER — Ambulatory Visit (HOSPITAL_COMMUNITY): Payer: Self-pay | Admitting: Oncology

## 2012-09-04 ENCOUNTER — Ambulatory Visit (HOSPITAL_COMMUNITY): Payer: Self-pay

## 2012-09-08 ENCOUNTER — Encounter (HOSPITAL_COMMUNITY): Payer: Self-pay

## 2012-09-08 ENCOUNTER — Encounter (HOSPITAL_BASED_OUTPATIENT_CLINIC_OR_DEPARTMENT_OTHER): Payer: Medicaid Other

## 2012-09-08 ENCOUNTER — Encounter (HOSPITAL_COMMUNITY): Payer: Medicaid Other | Attending: Oncology

## 2012-09-08 VITALS — BP 110/71 | HR 91 | Temp 98.1°F | Resp 20 | Ht 71.0 in | Wt 192.8 lb

## 2012-09-08 DIAGNOSIS — C9110 Chronic lymphocytic leukemia of B-cell type not having achieved remission: Secondary | ICD-10-CM

## 2012-09-08 DIAGNOSIS — R591 Generalized enlarged lymph nodes: Secondary | ICD-10-CM

## 2012-09-08 DIAGNOSIS — Z6841 Body Mass Index (BMI) 40.0 and over, adult: Secondary | ICD-10-CM

## 2012-09-08 DIAGNOSIS — D649 Anemia, unspecified: Secondary | ICD-10-CM

## 2012-09-08 DIAGNOSIS — R634 Abnormal weight loss: Secondary | ICD-10-CM

## 2012-09-08 DIAGNOSIS — D72820 Lymphocytosis (symptomatic): Secondary | ICD-10-CM

## 2012-09-08 LAB — PHOSPHORUS: Phosphorus: 4.9 mg/dL — ABNORMAL HIGH (ref 2.3–4.6)

## 2012-09-08 LAB — COMPREHENSIVE METABOLIC PANEL
ALT: 11 U/L (ref 0–53)
AST: 21 U/L (ref 0–37)
Albumin: 3.7 g/dL (ref 3.5–5.2)
Alkaline Phosphatase: 62 U/L (ref 39–117)
BUN: 26 mg/dL — ABNORMAL HIGH (ref 6–23)
CO2: 29 mEq/L (ref 19–32)
Calcium: 8.8 mg/dL (ref 8.4–10.5)
Chloride: 98 mEq/L (ref 96–112)
Creatinine, Ser: 0.78 mg/dL (ref 0.50–1.35)
GFR calc Af Amer: >90 mL/min (ref >90)
GFR calc non Af Amer: >90 mL/min (ref >90)
Glucose, Bld: 113 mg/dL — ABNORMAL HIGH (ref 70–99)
Potassium: 4.4 mEq/L (ref 3.5–5.1)
Sodium: 138 mEq/L (ref 135–145)
Total Bilirubin: 0.2 mg/dL — ABNORMAL LOW (ref 0.3–1.2)
Total Protein: 6.5 g/dL (ref 6.0–8.3)

## 2012-09-08 LAB — CBC WITH DIFFERENTIAL/PLATELET
Band Neutrophils: 0 % (ref 0–10)
Basophils Absolute: 0 10*3/uL (ref 0.0–0.1)
Basophils Relative: 0 % (ref 0–1)
Blasts: 0 %
Eosinophils Absolute: 0.8 10*3/uL — ABNORMAL HIGH (ref 0.0–0.7)
Eosinophils Relative: 3 % (ref 0–5)
HCT: 31.7 % — ABNORMAL LOW (ref 39.0–52.0)
Hemoglobin: 9.8 g/dL — ABNORMAL LOW (ref 13.0–17.0)
Lymphocytes Relative: 88 % — ABNORMAL HIGH (ref 12–46)
Lymphs Abs: 22.1 10*3/uL — ABNORMAL HIGH (ref 0.7–4.0)
MCH: 30.4 pg (ref 26.0–34.0)
MCHC: 30.9 g/dL (ref 30.0–36.0)
MCV: 98.4 fL (ref 78.0–100.0)
Metamyelocytes Relative: 0 %
Monocytes Absolute: 0 10*3/uL — ABNORMAL LOW (ref 0.1–1.0)
Monocytes Relative: 0 % — ABNORMAL LOW (ref 3–12)
Myelocytes: 0 %
Neutro Abs: 2.3 10*3/uL (ref 1.7–7.7)
Neutrophils Relative %: 9 % — ABNORMAL LOW (ref 43–77)
Platelets: 257 10*3/uL (ref 150–400)
Promyelocytes Absolute: 0 %
RBC: 3.22 MIL/uL — ABNORMAL LOW (ref 4.22–5.81)
RDW: 22 % — ABNORMAL HIGH (ref 11.5–15.5)
WBC: 25.2 10*3/uL — ABNORMAL HIGH (ref 4.0–10.5)
nRBC: 0 /100 WBC

## 2012-09-08 LAB — LACTATE DEHYDROGENASE: LDH: 326 U/L — ABNORMAL HIGH (ref 94–250)

## 2012-09-08 LAB — URIC ACID: Uric Acid, Serum: 6.5 mg/dL (ref 4.0–7.8)

## 2012-09-08 LAB — MAGNESIUM: Magnesium: 1.9 mg/dL (ref 1.5–2.5)

## 2012-09-08 NOTE — Patient Instructions (Addendum)
.  [facility] Discharge Instructions  RECOMMENDATIONS MADE BY THE CONSULTANT AND ANY TEST RESULTS WILL BE SENT TO YOUR REFERRING PHYSICIAN.  EXAM FINDINGS BY THE PHYSICIAN TODAY AND SIGNS OR SYMPTOMS TO REPORT TO CLINIC OR PRIMARY PHYSICIAN: Exam per Dr. Beckie Busing   INSTRUCTIONS GIVEN AND DISCUSSED: We will continue to do your labs and transfuse as needed  SPECIAL INSTRUCTIONS/FOLLOW-UP: 3 months  Thank you for choosing [facility] to provide your oncology and hematology care.  To afford each patient quality time with our providers, please arrive at least 15 minutes before your scheduled appointment time.  With your help, our goal is to use those 15 minutes to complete the necessary work-up to ensure our physicians have the information they need to help with your evaluation and healthcare recommendations.    Effective January 1st, 2014, we ask that you re-schedule your appointment with our physicians should you arrive 10 or more minutes late for your appointment.  We strive to give you quality time with our providers, and arriving late affects you and other patients whose appointments are after yours.    Again, thank you for choosing [facility].  Our hope is that these requests will decrease the amount of time that you wait before being seen by our physicians.       _____________________________________________________________  Should you have questions after your visit to [facility], please contact our office at [phone] between the hours of 8:30 a.m. and 5:00 p.m.  Voicemails left after 4:30 p.m. will not be returned until the following business day.  For prescription refill requests, have your pharmacy contact our office with your prescription refill request.
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
## 2012-09-08 NOTE — Progress Notes (Signed)
Tim Duncan's reason for visit today are for labs as scheduled per MD orders.  Venipuncture performed with a 23 gauge butterfly needle to R Antecubital.  [name] tolerated venipuncture well and without incident; questions were answered and patient was discharged.

## 2012-09-09 ENCOUNTER — Ambulatory Visit (HOSPITAL_COMMUNITY): Payer: Self-pay

## 2012-09-14 ENCOUNTER — Other Ambulatory Visit (HOSPITAL_COMMUNITY): Payer: Self-pay

## 2012-09-15 ENCOUNTER — Encounter (HOSPITAL_BASED_OUTPATIENT_CLINIC_OR_DEPARTMENT_OTHER): Payer: Medicaid Other

## 2012-09-15 DIAGNOSIS — C9110 Chronic lymphocytic leukemia of B-cell type not having achieved remission: Secondary | ICD-10-CM

## 2012-09-15 LAB — COMPREHENSIVE METABOLIC PANEL
ALT: 9 U/L (ref 0–53)
AST: 18 U/L (ref 0–37)
Albumin: 3.9 g/dL (ref 3.5–5.2)
Alkaline Phosphatase: 51 U/L (ref 39–117)
BUN: 31 mg/dL — ABNORMAL HIGH (ref 6–23)
CO2: 28 mEq/L (ref 19–32)
Calcium: 8.6 mg/dL (ref 8.4–10.5)
Chloride: 100 mEq/L (ref 96–112)
Creatinine, Ser: 0.99 mg/dL (ref 0.50–1.35)
GFR calc Af Amer: >90 mL/min (ref >90)
GFR calc non Af Amer: 87 mL/min — ABNORMAL LOW (ref >90)
Glucose, Bld: 102 mg/dL — ABNORMAL HIGH (ref 70–99)
Potassium: 4.4 mEq/L (ref 3.5–5.1)
Sodium: 138 mEq/L (ref 135–145)
Total Bilirubin: 0.3 mg/dL (ref 0.3–1.2)
Total Protein: 6.4 g/dL (ref 6.0–8.3)

## 2012-09-15 LAB — CBC
HCT: 32.1 % — ABNORMAL LOW (ref 39.0–52.0)
Hemoglobin: 10.3 g/dL — ABNORMAL LOW (ref 13.0–17.0)
MCH: 31.6 pg (ref 26.0–34.0)
MCHC: 32.1 g/dL (ref 30.0–36.0)
MCV: 98.5 fL (ref 78.0–100.0)
Platelets: 217 10*3/uL (ref 150–400)
RBC: 3.26 MIL/uL — ABNORMAL LOW (ref 4.22–5.81)
RDW: 21.1 % — ABNORMAL HIGH (ref 11.5–15.5)
WBC: 19.5 10*3/uL — ABNORMAL HIGH (ref 4.0–10.5)

## 2012-09-15 LAB — DIFFERENTIAL
Band Neutrophils: 0 % (ref 0–10)
Basophils Absolute: 0 10*3/uL (ref 0.0–0.1)
Basophils Relative: 0 % (ref 0–1)
Blasts: 0 %
Eosinophils Absolute: 0 10*3/uL (ref 0.0–0.7)
Eosinophils Relative: 0 % (ref 0–5)
Lymphocytes Relative: 87 % — ABNORMAL HIGH (ref 12–46)
Lymphs Abs: 17 10*3/uL — ABNORMAL HIGH (ref 0.7–4.0)
Metamyelocytes Relative: 0 %
Monocytes Absolute: 0 10*3/uL — ABNORMAL LOW (ref 0.1–1.0)
Monocytes Relative: 0 % — ABNORMAL LOW (ref 3–12)
Myelocytes: 0 %
Neutro Abs: 2.5 10*3/uL (ref 1.7–7.7)
Neutrophils Relative %: 13 % — ABNORMAL LOW (ref 43–77)
Promyelocytes Absolute: 0 %
nRBC: 0 /100 WBC

## 2012-09-15 LAB — LACTATE DEHYDROGENASE: LDH: 269 U/L — ABNORMAL HIGH (ref 94–250)

## 2012-09-15 LAB — PHOSPHORUS: Phosphorus: 4 mg/dL (ref 2.3–4.6)

## 2012-09-15 LAB — MAGNESIUM: Magnesium: 2 mg/dL (ref 1.5–2.5)

## 2012-09-15 NOTE — Progress Notes (Signed)
Labs drawn today for cbc/diff,cmp,ldh,uric acid,phos

## 2012-09-21 ENCOUNTER — Other Ambulatory Visit (HOSPITAL_COMMUNITY): Payer: Self-pay

## 2012-09-22 ENCOUNTER — Encounter (HOSPITAL_BASED_OUTPATIENT_CLINIC_OR_DEPARTMENT_OTHER): Payer: Medicaid Other

## 2012-09-22 DIAGNOSIS — C9110 Chronic lymphocytic leukemia of B-cell type not having achieved remission: Secondary | ICD-10-CM

## 2012-09-22 LAB — COMPREHENSIVE METABOLIC PANEL
ALT: 11 U/L (ref 0–53)
AST: 22 U/L (ref 0–37)
Albumin: 4 g/dL (ref 3.5–5.2)
Alkaline Phosphatase: 54 U/L (ref 39–117)
BUN: 22 mg/dL (ref 6–23)
CO2: 29 mEq/L (ref 19–32)
Calcium: 8.9 mg/dL (ref 8.4–10.5)
Chloride: 98 mEq/L (ref 96–112)
Creatinine, Ser: 0.91 mg/dL (ref 0.50–1.35)
GFR calc Af Amer: >90 mL/min (ref >90)
GFR calc non Af Amer: 90 mL/min — ABNORMAL LOW (ref >90)
Glucose, Bld: 112 mg/dL — ABNORMAL HIGH (ref 70–99)
Potassium: 4 mEq/L (ref 3.5–5.1)
Sodium: 139 mEq/L (ref 135–145)
Total Bilirubin: 0.5 mg/dL (ref 0.3–1.2)
Total Protein: 6.5 g/dL (ref 6.0–8.3)

## 2012-09-22 LAB — DIFFERENTIAL
Band Neutrophils: 0 % (ref 0–10)
Basophils Absolute: 0.2 10*3/uL — ABNORMAL HIGH (ref 0.0–0.1)
Basophils Relative: 1 % (ref 0–1)
Blasts: 0 %
Eosinophils Absolute: 0.2 10*3/uL (ref 0.0–0.7)
Eosinophils Relative: 1 % (ref 0–5)
Lymphocytes Relative: 92 % — ABNORMAL HIGH (ref 12–46)
Lymphs Abs: 14.7 10*3/uL — ABNORMAL HIGH (ref 0.7–4.0)
Metamyelocytes Relative: 0 %
Monocytes Absolute: 0.2 10*3/uL (ref 0.1–1.0)
Monocytes Relative: 1 % — ABNORMAL LOW (ref 3–12)
Myelocytes: 0 %
Neutro Abs: 0.8 10*3/uL — ABNORMAL LOW (ref 1.7–7.7)
Neutrophils Relative %: 5 % — ABNORMAL LOW (ref 43–77)
Promyelocytes Absolute: 0 %
nRBC: 0 /100 WBC

## 2012-09-22 LAB — PHOSPHORUS: Phosphorus: 5 mg/dL — ABNORMAL HIGH (ref 2.3–4.6)

## 2012-09-22 LAB — LACTATE DEHYDROGENASE: LDH: 286 U/L — ABNORMAL HIGH (ref 94–250)

## 2012-09-22 LAB — CBC
HCT: 32.8 % — ABNORMAL LOW (ref 39.0–52.0)
Hemoglobin: 10.8 g/dL — ABNORMAL LOW (ref 13.0–17.0)
MCH: 31.9 pg (ref 26.0–34.0)
MCHC: 32.9 g/dL (ref 30.0–36.0)
MCV: 96.8 fL (ref 78.0–100.0)
Platelets: 158 10*3/uL (ref 150–400)
RBC: 3.39 MIL/uL — ABNORMAL LOW (ref 4.22–5.81)
RDW: 20.1 % — ABNORMAL HIGH (ref 11.5–15.5)
WBC: 16.1 10*3/uL — ABNORMAL HIGH (ref 4.0–10.5)

## 2012-09-22 LAB — URIC ACID: Uric Acid, Serum: 6.1 mg/dL (ref 4.0–7.8)

## 2012-09-22 LAB — MAGNESIUM: Magnesium: 1.8 mg/dL (ref 1.5–2.5)

## 2012-09-22 NOTE — Progress Notes (Signed)
Labs drawn today for cbc/diff,cmp,mg,uric acid,ldh,phos

## 2012-09-28 ENCOUNTER — Other Ambulatory Visit (HOSPITAL_COMMUNITY): Payer: Self-pay

## 2012-10-06 ENCOUNTER — Encounter (HOSPITAL_COMMUNITY): Payer: Medicaid Other | Attending: Oncology

## 2012-10-06 DIAGNOSIS — C9110 Chronic lymphocytic leukemia of B-cell type not having achieved remission: Secondary | ICD-10-CM | POA: Insufficient documentation

## 2012-10-06 LAB — DIFFERENTIAL
Basophils Absolute: 0 10*3/uL (ref 0.0–0.1)
Basophils Relative: 0 % (ref 0–1)
Eosinophils Absolute: 0.3 10*3/uL (ref 0.0–0.7)
Eosinophils Relative: 3 % (ref 0–5)
Lymphocytes Relative: 69 % — ABNORMAL HIGH (ref 12–46)
Lymphs Abs: 5.3 10*3/uL — ABNORMAL HIGH (ref 0.7–4.0)
Monocytes Absolute: 1.3 10*3/uL — ABNORMAL HIGH (ref 0.1–1.0)
Monocytes Relative: 17 % — ABNORMAL HIGH (ref 3–12)
Neutro Abs: 0.7 10*3/uL — ABNORMAL LOW (ref 1.7–7.7)
Neutrophils Relative %: 10 % — ABNORMAL LOW (ref 43–77)

## 2012-10-06 LAB — URIC ACID: Uric Acid, Serum: 6.9 mg/dL (ref 4.0–7.8)

## 2012-10-06 LAB — CBC
HCT: 32.1 % — ABNORMAL LOW (ref 39.0–52.0)
Hemoglobin: 10.8 g/dL — ABNORMAL LOW (ref 13.0–17.0)
MCH: 31.1 pg (ref 26.0–34.0)
MCHC: 33.6 g/dL (ref 30.0–36.0)
MCV: 92.5 fL (ref 78.0–100.0)
Platelets: 224 10*3/uL (ref 150–400)
RBC: 3.47 MIL/uL — ABNORMAL LOW (ref 4.22–5.81)
RDW: 18.7 % — ABNORMAL HIGH (ref 11.5–15.5)
WBC: 7.7 10*3/uL (ref 4.0–10.5)

## 2012-10-06 LAB — COMPREHENSIVE METABOLIC PANEL
ALT: 5 U/L (ref 0–53)
AST: 12 U/L (ref 0–37)
Albumin: 3.7 g/dL (ref 3.5–5.2)
Alkaline Phosphatase: 54 U/L (ref 39–117)
BUN: 19 mg/dL (ref 6–23)
CO2: 30 mEq/L (ref 19–32)
Calcium: 9 mg/dL (ref 8.4–10.5)
Chloride: 96 mEq/L (ref 96–112)
Creatinine, Ser: 0.72 mg/dL (ref 0.50–1.35)
GFR calc Af Amer: >90 mL/min (ref >90)
GFR calc non Af Amer: >90 mL/min (ref >90)
Glucose, Bld: 105 mg/dL — ABNORMAL HIGH (ref 70–99)
Potassium: 3.9 mEq/L (ref 3.5–5.1)
Sodium: 135 mEq/L (ref 135–145)
Total Bilirubin: 0.5 mg/dL (ref 0.3–1.2)
Total Protein: 6.4 g/dL (ref 6.0–8.3)

## 2012-10-06 LAB — LACTATE DEHYDROGENASE: LDH: 216 U/L (ref 94–250)

## 2012-10-06 LAB — MAGNESIUM: Magnesium: 2.2 mg/dL (ref 1.5–2.5)

## 2012-10-06 LAB — PHOSPHORUS: Phosphorus: 4.4 mg/dL (ref 2.3–4.6)

## 2012-10-06 NOTE — Progress Notes (Signed)
Labs drawn today for cbc/diff,cmp,mg,phos,ldh,uric acid

## 2012-10-08 ENCOUNTER — Emergency Department (HOSPITAL_COMMUNITY)
Admission: EM | Admit: 2012-10-08 | Discharge: 2012-10-09 | Disposition: A | Discharge status: Other Acute Inpatient Hospital | Payer: Medicaid Other | Attending: Emergency Medicine | Admitting: Emergency Medicine

## 2012-10-08 ENCOUNTER — Encounter (HOSPITAL_COMMUNITY): Payer: Self-pay | Admitting: Emergency Medicine

## 2012-10-08 ENCOUNTER — Emergency Department (HOSPITAL_COMMUNITY): Payer: Medicaid Other

## 2012-10-08 DIAGNOSIS — R591 Generalized enlarged lymph nodes: Secondary | ICD-10-CM | POA: Insufficient documentation

## 2012-10-08 DIAGNOSIS — Z859 Personal history of malignant neoplasm, unspecified: Secondary | ICD-10-CM | POA: Insufficient documentation

## 2012-10-08 DIAGNOSIS — D72820 Lymphocytosis (symptomatic): Secondary | ICD-10-CM | POA: Insufficient documentation

## 2012-10-08 DIAGNOSIS — Z862 Personal history of diseases of the blood and blood-forming organs and certain disorders involving the immune mechanism: Secondary | ICD-10-CM | POA: Insufficient documentation

## 2012-10-08 DIAGNOSIS — L03315 Cellulitis of perineum: Secondary | ICD-10-CM

## 2012-10-08 DIAGNOSIS — D75839 Thrombocytosis, unspecified: Secondary | ICD-10-CM | POA: Insufficient documentation

## 2012-10-08 DIAGNOSIS — C9110 Chronic lymphocytic leukemia of B-cell type not having achieved remission: Secondary | ICD-10-CM | POA: Insufficient documentation

## 2012-10-08 DIAGNOSIS — L03319 Cellulitis of trunk, unspecified: Secondary | ICD-10-CM | POA: Insufficient documentation

## 2012-10-08 DIAGNOSIS — L02219 Cutaneous abscess of trunk, unspecified: Secondary | ICD-10-CM | POA: Insufficient documentation

## 2012-10-08 DIAGNOSIS — Z87891 Personal history of nicotine dependence: Secondary | ICD-10-CM | POA: Insufficient documentation

## 2012-10-08 DIAGNOSIS — D473 Essential (hemorrhagic) thrombocythemia: Secondary | ICD-10-CM | POA: Insufficient documentation

## 2012-10-08 DIAGNOSIS — Z79899 Other long term (current) drug therapy: Secondary | ICD-10-CM | POA: Insufficient documentation

## 2012-10-08 HISTORY — DX: Chronic lymphocytic leukemia of B-cell type not having achieved remission: C91.10

## 2012-10-08 HISTORY — DX: Essential (hemorrhagic) thrombocythemia: D47.3

## 2012-10-08 HISTORY — DX: Lymphocytosis (symptomatic): D72.820

## 2012-10-08 HISTORY — DX: Generalized enlarged lymph nodes: R59.1

## 2012-10-08 HISTORY — DX: Thrombocytosis, unspecified: D75.839

## 2012-10-08 LAB — CBC WITH DIFFERENTIAL/PLATELET
Basophils Absolute: 0 10*3/uL (ref 0.0–0.1)
Basophils Relative: 0 % (ref 0–1)
Eosinophils Absolute: 0 10*3/uL (ref 0.0–0.7)
Eosinophils Relative: 0 % (ref 0–5)
HCT: 32.6 % — ABNORMAL LOW (ref 39.0–52.0)
Hemoglobin: 11.2 g/dL — ABNORMAL LOW (ref 13.0–17.0)
Lymphocytes Relative: 82 % — ABNORMAL HIGH (ref 12–46)
Lymphs Abs: 6.8 10*3/uL — ABNORMAL HIGH (ref 0.7–4.0)
MCH: 31 pg (ref 26.0–34.0)
MCHC: 34.4 g/dL (ref 30.0–36.0)
MCV: 90.3 fL (ref 78.0–100.0)
Monocytes Absolute: 0.8 10*3/uL (ref 0.1–1.0)
Monocytes Relative: 9 % (ref 3–12)
Neutro Abs: 0.8 10*3/uL — ABNORMAL LOW (ref 1.7–7.7)
Neutrophils Relative %: 9 % — ABNORMAL LOW (ref 43–77)
Platelets: 228 10*3/uL (ref 150–400)
RBC: 3.61 MIL/uL — ABNORMAL LOW (ref 4.22–5.81)
RDW: 17.9 % — ABNORMAL HIGH (ref 11.5–15.5)
WBC: 8.4 10*3/uL (ref 4.0–10.5)

## 2012-10-08 LAB — URINALYSIS W MICROSCOPIC + REFLEX CULTURE
Bilirubin Urine: NEGATIVE
Glucose, UA: NEGATIVE mg/dL
Hgb urine dipstick: NEGATIVE
Ketones, ur: NEGATIVE mg/dL
Leukocytes, UA: NEGATIVE
Nitrite: NEGATIVE
Protein, ur: NEGATIVE mg/dL
Specific Gravity, Urine: 1.015 (ref 1.005–1.030)
Urobilinogen, UA: 0.2 mg/dL (ref 0.0–1.0)
pH: 5.5 (ref 5.0–8.0)

## 2012-10-08 LAB — BASIC METABOLIC PANEL
BUN: 26 mg/dL — ABNORMAL HIGH (ref 6–23)
CO2: 26 mEq/L (ref 19–32)
Calcium: 9.2 mg/dL (ref 8.4–10.5)
Chloride: 96 mEq/L (ref 96–112)
Creatinine, Ser: 0.7 mg/dL (ref 0.50–1.35)
GFR calc Af Amer: >90 mL/min (ref >90)
GFR calc non Af Amer: >90 mL/min (ref >90)
Glucose, Bld: 149 mg/dL — ABNORMAL HIGH (ref 70–99)
Potassium: 4.1 mEq/L (ref 3.5–5.1)
Sodium: 134 mEq/L — ABNORMAL LOW (ref 135–145)

## 2012-10-08 LAB — LACTIC ACID, PLASMA: Lactic Acid, Venous: 1.2 mmol/L (ref 0.5–2.2)

## 2012-10-08 MED ORDER — VANCOMYCIN HCL IN DEXTROSE 1-5 GM/200ML-% IV SOLN
1000.0000 mg | Freq: Once | INTRAVENOUS | Status: AC
  Administered 2012-10-08: 1000 mg via INTRAVENOUS
  Filled 2012-10-08: qty 200

## 2012-10-08 MED ORDER — IOHEXOL 300 MG/ML  SOLN
50.0000 mL | Freq: Once | INTRAMUSCULAR | Status: AC | PRN
  Administered 2012-10-08: 50 mL via ORAL

## 2012-10-08 MED ORDER — FENTANYL CITRATE 0.05 MG/ML IJ SOLN: 25.0000 ug | INTRAMUSCULAR | Status: DC | PRN

## 2012-10-08 MED ORDER — SODIUM CHLORIDE 0.9 % IV SOLN
INTRAVENOUS | Status: DC
  Administered 2012-10-08: 21:00:00 via INTRAVENOUS

## 2012-10-08 MED ORDER — IOHEXOL 300 MG/ML  SOLN
100.0000 mL | Freq: Once | INTRAMUSCULAR | Status: AC | PRN
  Administered 2012-10-08: 100 mL via INTRAVENOUS

## 2012-10-08 MED ORDER — VANCOMYCIN HCL IN DEXTROSE 1-5 GM/200ML-% IV SOLN: 1000.0000 mg | Freq: Once | INTRAVENOUS | Status: DC

## 2012-10-08 MED ORDER — MORPHINE SULFATE 4 MG/ML IJ SOLN
4.0000 mg | INTRAMUSCULAR | Status: AC | PRN
  Administered 2012-10-08 (×2): 4 mg via INTRAVENOUS
  Filled 2012-10-08 (×2): qty 1

## 2012-10-08 NOTE — ED Notes (Signed)
Cleaned pt's groin area, pt had loose brown stool accumulated around groin and rectum. Pt has multiple oozing blisters on groin creases, scrotum, penis, rectum. Pt states it has been developing and getting worse over past 2 weeks. Pt laying with legs spread due to the burning pain and discomfort. Pt also has red rash on face and neck from reaction to chemotherapy.

## 2012-10-08 NOTE — ED Provider Notes (Signed)
History    CSN: [id number] Arrival date & time 10/08/12  1733  First MD Initiated Contact with Patient 10/08/12 1949     Chief Complaint  Patient presents with  . Rash   HPI Pt was seen at 1955.  Per pt, c/o gradual onset and worsening of persistent perineal painful "rash" that began 5 days ago. Pt describes the rash as "burning." Pt states he has hx of CLL, with LD chemo approx 10 days ago. Pt states he has previously had a rash to his face and neck, and even his entire body, for the past 1 - 2 months; but received "some steroids with my chemo that made it better." States he was told at that time his rash was due to one of his chemo meds. Denies fevers, no abd pain, no back pain, no dysuria/hematuria, no N/V/D, no CP/SOB.      Past Medical History  Diagnosis Date  . Anemia 08/26/2012  . Cancer     cll  . CLL (chronic lymphocytic leukemia)   . Lymphadenopathy   . Lymphocytosis   . Thrombocytosis    Past Surgical History  Procedure Laterality Date  . Hernia repair    . Fracture surgery    . Portacath placement      History  Substance Use Topics  . Smoking status: Former Games developer  . Smokeless tobacco: Not on file  . Alcohol Use: No    Review of Systems ROS: Statement: All systems negative except as marked or noted in the HPI; Constitutional: Negative for fever and chills. ; ; Eyes: Negative for eye pain, redness and discharge. ; ; ENMT: Negative for ear pain, hoarseness, nasal congestion, sinus pressure and sore throat. ; ; Cardiovascular: Negative for chest pain, palpitations, diaphoresis, dyspnea and peripheral edema. ; ; Respiratory: Negative for cough, wheezing and stridor. ; ; Gastrointestinal: Negative for nausea, vomiting, diarrhea, abdominal pain, blood in stool, hematemesis, jaundice and rectal bleeding. . ; ; Genitourinary: Negative for dysuria, flank pain and hematuria. ; ; Musculoskeletal: Negative for back pain and neck pain. Negative for swelling and trauma.; ; Skin:  +rash, erythema, skin lesions. Negative for pruritus, abrasions, blisters, bruising.; ; Neuro: Negative for headache, lightheadedness and neck stiffness. Negative for weakness, altered level of consciousness , altered mental status, extremity weakness, paresthesias, involuntary movement, seizure and syncope.       Allergies  Review of patient's allergies indicates no known allergies.  Home Medications   Current Outpatient Rx  Name  Route  Sig  Dispense  Refill  . allopurinol (ZYLOPRIM) 300 MG tablet      300 mg. Take 1 tablet (300 mg total) by mouth daily.         Marland Kitchen amiodarone (PACERONE) 200 MG tablet   Oral   Take 200 mg by mouth daily.          . furosemide (LASIX) 40 MG tablet      40 mg. Take 1 tablet (40 mg total) by mouth daily.         . ondansetron (ZOFRAN) 8 MG tablet      8 mg. Take 1 tablet (8 mg total) by mouth every 8 (eight) hours as needed for Nausea.         . calcium carbonate (TUMS) 500 MG chewable tablet      1,500 mg. Take 3 tablets (1,500 mg total) by mouth 3 times daily with meals.         . senna-docusate (SENOKOT-S) 8.6-50 MG  per tablet      1 tablet. Take 1 tablet by mouth 2 times daily.          BP 115/63  Pulse 76  Temp(Src) 97.6 F (36.4 C) (Oral)  Resp 18  SpO2 98% Physical Exam 200: Physical examination:  Nursing notes reviewed; Vital signs and O2 SAT reviewed;  Constitutional: Well developed, Well nourished, Well hydrated, In no acute distress; Head:  Normocephalic, atraumatic; Eyes: EOMI, PERRL, No scleral icterus; ENMT: Mouth and pharynx normal, Mucous membranes moist; Neck: Supple, Full range of motion, No lymphadenopathy; Cardiovascular: Regular rate and rhythm, No gallop; Respiratory: Breath sounds clear & equal bilaterally, No rales, rhonchi, wheezes.  Speaking full sentences with ease, Normal respiratory effort/excursion; Chest: Nontender, Movement normal; Abdomen: Soft, Nontender, Nondistended, Normal bowel sounds;  Genitourinary: No CVA tenderness. +erythemetous indurated rash to pubis, scrotum, perineum, buttocks and gluteal cleft which is very tender to palp. +scattered broken small blisters to scrotum, perineum, buttocks and gluteal cleft. No apparent intact blisters. No soft tissue crepitus, no ecchymosis, no ulcers.; Extremities: Pulses normal, No tenderness, No edema, No calf edema or asymmetry.; Neuro: AA&Ox3, Major CN grossly intact.  Speech clear. No gross focal motor or sensory deficits in extremities.; Skin: Color normal, Warm, Dry, +scaling red rash scattered to face and neck.    ED Course  Procedures     MDM  MDM Reviewed: previous chart, nursing note and vitals Reviewed previous: labs Interpretation: labs and CT scan   Results for orders placed during the hospital encounter of 10/08/12  URINALYSIS W MICROSCOPIC + REFLEX CULTURE      Result Value Range   Color, Urine YELLOW  YELLOW   APPearance CLEAR  CLEAR   Specific Gravity, Urine 1.015  1.005 - 1.030   pH 5.5  5.0 - 8.0   Glucose, UA NEGATIVE  NEGATIVE mg/dL   Hgb urine dipstick NEGATIVE  NEGATIVE   Bilirubin Urine NEGATIVE  NEGATIVE   Ketones, ur NEGATIVE  NEGATIVE mg/dL   Protein, ur NEGATIVE  NEGATIVE mg/dL   Urobilinogen, UA 0.2  0.0 - 1.0 mg/dL   Nitrite NEGATIVE  NEGATIVE   Leukocytes, UA NEGATIVE  NEGATIVE   WBC, UA 0-2  <3 WBC/hpf   RBC / HPF 0-2  <3 RBC/hpf   Bacteria, UA MANY (*) RARE   Squamous Epithelial / LPF RARE  RARE   Urine-Other MUCOUS PRESENT    BASIC METABOLIC PANEL      Result Value Range   Sodium 134 (*) 135 - 145 mEq/L   Potassium 4.1  3.5 - 5.1 mEq/L   Chloride 96  96 - 112 mEq/L   CO2 26  19 - 32 mEq/L   Glucose, Bld 149 (*) 70 - 99 mg/dL   BUN 26 (*) 6 - 23 mg/dL   Creatinine, Ser 1.61  0.50 - 1.35 mg/dL   Calcium 9.2  8.4 - 09.6 mg/dL   GFR calc non Af Amer >90  >90 mL/min   GFR calc Af Amer >90  >90 mL/min  CBC WITH DIFFERENTIAL      Result Value Range   WBC 8.4  4.0 - 10.5 K/uL   RBC  3.61 (*) 4.22 - 5.81 MIL/uL   Hemoglobin 11.2 (*) 13.0 - 17.0 g/dL   HCT 04.5 (*) 40.9 - 81.1 %   MCV 90.3  78.0 - 100.0 fL   MCH 31.0  26.0 - 34.0 pg   MCHC 34.4  30.0 - 36.0 g/dL   RDW 91.4 (*)  11.5 - 15.5 %   Platelets 228  150 - 400 K/uL   Neutrophils Relative % 9 (*) 43 - 77 %   Lymphocytes Relative 82 (*) 12 - 46 %   Monocytes Relative 9  3 - 12 %   Eosinophils Relative 0  0 - 5 %   Basophils Relative 0  0 - 1 %   Neutro Abs 0.8 (*) 1.7 - 7.7 K/uL   Lymphs Abs 6.8 (*) 0.7 - 4.0 K/uL   Monocytes Absolute 0.8  0.1 - 1.0 K/uL   Eosinophils Absolute 0.0  0.0 - 0.7 K/uL   Basophils Absolute 0.0  0.0 - 0.1 K/uL   WBC Morphology ATYPICAL LYMPHOCYTES     Smear Review LARGE PLATELETS PRESENT    LACTIC ACID, PLASMA      Result Value Range   Lactic Acid, Venous 1.2  0.5 - 2.2 mmol/L   Ct Abdomen Pelvis W Contrast 10/08/2012   *RADIOLOGY REPORT*  Clinical Data: Cancer.  CLL.  Anemia.  Itchy rash in the groin for 5 days.  CT ABDOMEN AND PELVIS WITH CONTRAST  Technique:  Multidetector CT imaging of the abdomen and pelvis was performed following the standard protocol during bolus administration of intravenous contrast.  Contrast: 50mL OMNIPAQUE IOHEXOL 300 MG/ML  SOLN, OMNIPAQUE IOHEXOL 300 MG/ML  SOLN  Comparison: CT chest abdomen and pelvis 06/08/2012  Findings: The lung bases are clear.  Small pericardial effusion, demonstrating significant improvement since the previous study. The liver is moderately enlarged without focal lesion.  There is splenomegaly.  There is extensive lymphadenopathy throughout the retroperitoneal, celiac axis, porta hepatis, gastrohepatic ligament, and mesenteric regions.  An index lymph node is measured in the celiac axis region at 3.4 cm, compared with 3.8 cm previously.  Distribution of lymphadenopathy is not significantly changed.  Spleen size is mildly improved.  No adrenal gland nodules.  No aortic aneurysm.  Inferior vena cava is normal.  Tiny exophytic cyst  arising from the lower pole of the left kidney measuring less than 1 cm diameter and stable since the previous study.  The kidneys are otherwise unremarkable.  The stomach and small bowel are not abnormally distended.  No discrete wall thickening.  Stool filled colon without distension.  No free air or free fluid in the abdomen.  Pelvis:  Extensive pelvic lymphadenopathy beginning in the aortic bifurcation extending along the iliac chains bilaterally. Significant bilateral lymphadenopathy in the groins.  Index node in the right groin and measures 4.1 x 5.3 cm compared with 5.4 x 7.2 cm previously.  Bladder wall is not thickened.  Rectosigmoid colon is decompressed.  No evidence of diverticulitis. Appendix is not identified.  No free or loculated pelvic fluid collections.  No significant infiltration in the subcutaneous fat.  IMPRESSION: Extensive mesenteric, retroperitoneal, and pelvic and groin lymphadenopathy, demonstrating slight improvement since previous study.  Hepatic splenomegaly is probably also improved.  No acute process is demonstrated in the abdomen or pelvis.   Original Report Authenticated By: Burman Nieves, M.D.     2255:  T/C to [facility] MD above: no CT evidence of Fournier's at this time. Will obtain BC x2 and start IV vancomycin. T/C to [facility] Heme/Onc Dr. Lowell Guitar, case discussed, including:  HPI, pertinent PM/SHx, VS/PE, dx testing, ED course and treatment:  Agreeable to accept transfer/admit. Dx and testing, as well as d/w Heme/Onc MD, d/w pt.  Questions answered.  Verb understanding, agreeable to transfer/admit to [facility].   Laray Anger, DO 10/09/12 1425

## 2012-10-08 NOTE — ED Notes (Signed)
Pt c/o pain and itchy rash in groin x 5 days.

## 2012-10-09 NOTE — ED Notes (Signed)
NS infusion and Vancomycin stopped in ER will continue en route via [facility].

## 2012-10-13 ENCOUNTER — Other Ambulatory Visit (HOSPITAL_COMMUNITY): Payer: Self-pay

## 2012-10-13 LAB — CULTURE, BLOOD (ROUTINE X 2)
Culture: NO GROWTH
Culture: NO GROWTH

## 2012-10-20 ENCOUNTER — Other Ambulatory Visit (HOSPITAL_COMMUNITY): Payer: Self-pay

## 2012-10-21 ENCOUNTER — Encounter (HOSPITAL_BASED_OUTPATIENT_CLINIC_OR_DEPARTMENT_OTHER): Payer: Medicaid Other

## 2012-10-21 DIAGNOSIS — C9110 Chronic lymphocytic leukemia of B-cell type not having achieved remission: Secondary | ICD-10-CM

## 2012-10-21 LAB — CBC
HCT: 35.6 % — ABNORMAL LOW (ref 39.0–52.0)
Hemoglobin: 11.7 g/dL — ABNORMAL LOW (ref 13.0–17.0)
MCH: 30.9 pg (ref 26.0–34.0)
MCHC: 32.9 g/dL (ref 30.0–36.0)
MCV: 93.9 fL (ref 78.0–100.0)
Platelets: 285 10*3/uL (ref 150–400)
RBC: 3.79 MIL/uL — ABNORMAL LOW (ref 4.22–5.81)
RDW: 19.5 % — ABNORMAL HIGH (ref 11.5–15.5)
WBC: 19.2 10*3/uL — ABNORMAL HIGH (ref 4.0–10.5)

## 2012-10-21 LAB — LACTATE DEHYDROGENASE: LDH: 168 U/L (ref 94–250)

## 2012-10-21 LAB — COMPREHENSIVE METABOLIC PANEL
ALT: 13 U/L (ref 0–53)
AST: 16 U/L (ref 0–37)
Albumin: 3.7 g/dL (ref 3.5–5.2)
Alkaline Phosphatase: 59 U/L (ref 39–117)
BUN: 26 mg/dL — ABNORMAL HIGH (ref 6–23)
CO2: 33 mEq/L — ABNORMAL HIGH (ref 19–32)
Calcium: 9 mg/dL (ref 8.4–10.5)
Chloride: 98 mEq/L (ref 96–112)
Creatinine, Ser: 0.8 mg/dL (ref 0.50–1.35)
GFR calc Af Amer: >90 mL/min (ref >90)
GFR calc non Af Amer: >90 mL/min (ref >90)
Glucose, Bld: 109 mg/dL — ABNORMAL HIGH (ref 70–99)
Potassium: 4.2 mEq/L (ref 3.5–5.1)
Sodium: 138 mEq/L (ref 135–145)
Total Bilirubin: 0.3 mg/dL (ref 0.3–1.2)
Total Protein: 6.9 g/dL (ref 6.0–8.3)

## 2012-10-21 LAB — DIFFERENTIAL
Band Neutrophils: 0 % (ref 0–10)
Basophils Absolute: 0 10*3/uL (ref 0.0–0.1)
Basophils Relative: 0 % (ref 0–1)
Blasts: 0 %
Eosinophils Absolute: 0 10*3/uL (ref 0.0–0.7)
Eosinophils Relative: 0 % (ref 0–5)
Lymphocytes Relative: 86 % — ABNORMAL HIGH (ref 12–46)
Lymphs Abs: 16.5 10*3/uL — ABNORMAL HIGH (ref 0.7–4.0)
Metamyelocytes Relative: 0 %
Monocytes Absolute: 0 10*3/uL — ABNORMAL LOW (ref 0.1–1.0)
Monocytes Relative: 0 % — ABNORMAL LOW (ref 3–12)
Myelocytes: 0 %
Neutro Abs: 2.7 10*3/uL (ref 1.7–7.7)
Neutrophils Relative %: 14 % — ABNORMAL LOW (ref 43–77)
Promyelocytes Absolute: 0 %
nRBC: 0 /100 WBC

## 2012-10-21 LAB — MAGNESIUM: Magnesium: 2.2 mg/dL (ref 1.5–2.5)

## 2012-10-21 LAB — PHOSPHORUS: Phosphorus: 3 mg/dL (ref 2.3–4.6)

## 2012-10-21 LAB — URIC ACID: Uric Acid, Serum: 5.5 mg/dL (ref 4.0–7.8)

## 2012-10-21 NOTE — Progress Notes (Signed)
Labs drawn today for cbc/diff,cmp,ldh,phos,mg,uric acid

## 2012-11-02 DIAGNOSIS — A6000 Herpesviral infection of urogenital system, unspecified: Secondary | ICD-10-CM | POA: Insufficient documentation

## 2012-12-03 DIAGNOSIS — L0100 Impetigo, unspecified: Secondary | ICD-10-CM | POA: Insufficient documentation

## 2012-12-15 ENCOUNTER — Ambulatory Visit (HOSPITAL_COMMUNITY): Payer: Self-pay | Admitting: Oncology

## 2013-01-06 ENCOUNTER — Encounter (HOSPITAL_COMMUNITY): Payer: Medicaid Other | Attending: Oncology

## 2013-01-06 DIAGNOSIS — C9110 Chronic lymphocytic leukemia of B-cell type not having achieved remission: Secondary | ICD-10-CM | POA: Insufficient documentation

## 2013-01-06 LAB — COMPREHENSIVE METABOLIC PANEL
ALT: 22 U/L (ref 0–53)
AST: 27 U/L (ref 0–37)
Albumin: 4.3 g/dL (ref 3.5–5.2)
Alkaline Phosphatase: 98 U/L (ref 39–117)
BUN: 17 mg/dL (ref 6–23)
CO2: 27 mEq/L (ref 19–32)
Calcium: 9.3 mg/dL (ref 8.4–10.5)
Chloride: 101 mEq/L (ref 96–112)
Creatinine, Ser: 0.83 mg/dL (ref 0.50–1.35)
GFR calc Af Amer: >90 mL/min (ref >90)
GFR calc non Af Amer: >90 mL/min (ref >90)
Glucose, Bld: 110 mg/dL — ABNORMAL HIGH (ref 70–99)
Potassium: 3.6 mEq/L (ref 3.5–5.1)
Sodium: 140 mEq/L (ref 135–145)
Total Bilirubin: 0.5 mg/dL (ref 0.3–1.2)
Total Protein: 7.5 g/dL (ref 6.0–8.3)

## 2013-01-06 LAB — CBC
HCT: 34.5 % — ABNORMAL LOW (ref 39.0–52.0)
Hemoglobin: 11.2 g/dL — ABNORMAL LOW (ref 13.0–17.0)
MCH: 32.2 pg (ref 26.0–34.0)
MCHC: 32.5 g/dL (ref 30.0–36.0)
MCV: 99.1 fL (ref 78.0–100.0)
Platelets: 227 10*3/uL (ref 150–400)
RBC: 3.48 MIL/uL — ABNORMAL LOW (ref 4.22–5.81)
RDW: 17.7 % — ABNORMAL HIGH (ref 11.5–15.5)
WBC: 192.7 10*3/uL (ref 4.0–10.5)

## 2013-01-06 LAB — MAGNESIUM: Magnesium: 2.1 mg/dL (ref 1.5–2.5)

## 2013-01-06 LAB — DIFFERENTIAL
Band Neutrophils: 0 % (ref 0–10)
Basophils Absolute: 0 10*3/uL (ref 0.0–0.1)
Basophils Relative: 0 % (ref 0–1)
Blasts: 0 %
Eosinophils Absolute: 0 10*3/uL (ref 0.0–0.7)
Eosinophils Relative: 0 % (ref 0–5)
Lymphocytes Relative: 93 % — ABNORMAL HIGH (ref 12–46)
Lymphs Abs: 179.2 10*3/uL — ABNORMAL HIGH (ref 0.7–4.0)
Metamyelocytes Relative: 0 %
Monocytes Absolute: 0 10*3/uL — ABNORMAL LOW (ref 0.1–1.0)
Monocytes Relative: 0 % — ABNORMAL LOW (ref 3–12)
Myelocytes: 0 %
Neutro Abs: 13.5 10*3/uL — ABNORMAL HIGH (ref 1.7–7.7)
Neutrophils Relative %: 7 % — ABNORMAL LOW (ref 43–77)
Promyelocytes Absolute: 0 %
nRBC: 0 /100 WBC

## 2013-01-06 LAB — LACTATE DEHYDROGENASE: LDH: 232 U/L (ref 94–250)

## 2013-01-06 LAB — URIC ACID: Uric Acid, Serum: 7.3 mg/dL (ref 4.0–7.8)

## 2013-01-06 LAB — PHOSPHORUS: Phosphorus: 4.4 mg/dL (ref 2.3–4.6)

## 2013-01-06 NOTE — Progress Notes (Signed)
Labs drawn today for cbc/diff,cmp,mg,phos,ldh,uric acid

## 2013-01-13 ENCOUNTER — Encounter (HOSPITAL_BASED_OUTPATIENT_CLINIC_OR_DEPARTMENT_OTHER): Payer: Medicaid Other

## 2013-01-13 DIAGNOSIS — C9110 Chronic lymphocytic leukemia of B-cell type not having achieved remission: Secondary | ICD-10-CM

## 2013-01-13 LAB — COMPREHENSIVE METABOLIC PANEL
ALT: 17 U/L (ref 0–53)
AST: 23 U/L (ref 0–37)
Albumin: 4.5 g/dL (ref 3.5–5.2)
Alkaline Phosphatase: 93 U/L (ref 39–117)
BUN: 21 mg/dL (ref 6–23)
CO2: 29 mEq/L (ref 19–32)
Calcium: 10 mg/dL (ref 8.4–10.5)
Chloride: 99 mEq/L (ref 96–112)
Creatinine, Ser: 0.92 mg/dL (ref 0.50–1.35)
GFR calc Af Amer: >90 mL/min (ref >90)
GFR calc non Af Amer: 89 mL/min — ABNORMAL LOW (ref >90)
Glucose, Bld: 93 mg/dL (ref 70–99)
Potassium: 4.3 mEq/L (ref 3.5–5.1)
Sodium: 140 mEq/L (ref 135–145)
Total Bilirubin: 0.4 mg/dL (ref 0.3–1.2)
Total Protein: 7.6 g/dL (ref 6.0–8.3)

## 2013-01-13 LAB — DIFFERENTIAL
Band Neutrophils: 0 % (ref 0–10)
Basophils Absolute: 0 10*3/uL (ref 0.0–0.1)
Basophils Relative: 0 % (ref 0–1)
Blasts: 0 %
Eosinophils Absolute: 0 10*3/uL (ref 0.0–0.7)
Eosinophils Relative: 0 % (ref 0–5)
Lymphocytes Relative: 93 % — ABNORMAL HIGH (ref 12–46)
Lymphs Abs: 173.7 10*3/uL — ABNORMAL HIGH (ref 0.7–4.0)
Metamyelocytes Relative: 0 %
Monocytes Absolute: 0 10*3/uL — ABNORMAL LOW (ref 0.1–1.0)
Monocytes Relative: 0 % — ABNORMAL LOW (ref 3–12)
Myelocytes: 0 %
Neutro Abs: 13.1 10*3/uL — ABNORMAL HIGH (ref 1.7–7.7)
Neutrophils Relative %: 7 % — ABNORMAL LOW (ref 43–77)
Promyelocytes Absolute: 0 %
nRBC: 0 /100 WBC

## 2013-01-13 LAB — CBC
HCT: 36.2 % — ABNORMAL LOW (ref 39.0–52.0)
Hemoglobin: 11.5 g/dL — ABNORMAL LOW (ref 13.0–17.0)
MCH: 32.5 pg (ref 26.0–34.0)
MCHC: 31.8 g/dL (ref 30.0–36.0)
MCV: 102.3 fL — ABNORMAL HIGH (ref 78.0–100.0)
Platelets: 238 10*3/uL (ref 150–400)
RBC: 3.54 MIL/uL — ABNORMAL LOW (ref 4.22–5.81)
RDW: 17.8 % — ABNORMAL HIGH (ref 11.5–15.5)
WBC: 186.8 10*3/uL (ref 4.0–10.5)

## 2013-01-13 LAB — MAGNESIUM: Magnesium: 2.1 mg/dL (ref 1.5–2.5)

## 2013-01-13 LAB — URIC ACID: Uric Acid, Serum: 6 mg/dL (ref 4.0–7.8)

## 2013-01-13 LAB — PHOSPHORUS: Phosphorus: 4.3 mg/dL (ref 2.3–4.6)

## 2013-01-13 LAB — LACTATE DEHYDROGENASE: LDH: 243 U/L (ref 94–250)

## 2013-01-13 NOTE — Progress Notes (Signed)
Labs drawn today for cbc/diff,cmp,mg,phos,ldh,uric acid 

## 2013-01-20 ENCOUNTER — Encounter (HOSPITAL_BASED_OUTPATIENT_CLINIC_OR_DEPARTMENT_OTHER): Payer: Medicaid Other

## 2013-01-20 DIAGNOSIS — C9110 Chronic lymphocytic leukemia of B-cell type not having achieved remission: Secondary | ICD-10-CM

## 2013-01-20 LAB — URIC ACID: Uric Acid, Serum: 5.9 mg/dL (ref 4.0–7.8)

## 2013-01-20 LAB — CBC WITH DIFFERENTIAL/PLATELET
Band Neutrophils: 0 % (ref 0–10)
Basophils Absolute: 0 10*3/uL (ref 0.0–0.1)
Basophils Relative: 0 % (ref 0–1)
Blasts: 0 %
Eosinophils Absolute: 0 10*3/uL (ref 0.0–0.7)
Eosinophils Relative: 0 % (ref 0–5)
HCT: 34.8 % — ABNORMAL LOW (ref 39.0–52.0)
Hemoglobin: 11.2 g/dL — ABNORMAL LOW (ref 13.0–17.0)
Lymphocytes Relative: 93 % — ABNORMAL HIGH (ref 12–46)
Lymphs Abs: 152.9 10*3/uL — ABNORMAL HIGH (ref 0.7–4.0)
MCH: 32.4 pg (ref 26.0–34.0)
MCHC: 32.2 g/dL (ref 30.0–36.0)
MCV: 100.6 fL — ABNORMAL HIGH (ref 78.0–100.0)
Metamyelocytes Relative: 0 %
Monocytes Absolute: 0 10*3/uL — ABNORMAL LOW (ref 0.1–1.0)
Monocytes Relative: 0 % — ABNORMAL LOW (ref 3–12)
Myelocytes: 0 %
Neutro Abs: 11.5 10*3/uL — ABNORMAL HIGH (ref 1.7–7.7)
Neutrophils Relative %: 7 % — ABNORMAL LOW (ref 43–77)
Platelets: 200 10*3/uL (ref 150–400)
Promyelocytes Absolute: 0 %
RBC: 3.46 MIL/uL — ABNORMAL LOW (ref 4.22–5.81)
RDW: 17.3 % — ABNORMAL HIGH (ref 11.5–15.5)
WBC: 164.4 10*3/uL (ref 4.0–10.5)
nRBC: 0 /100 WBC

## 2013-01-20 LAB — MAGNESIUM: Magnesium: 2 mg/dL (ref 1.5–2.5)

## 2013-01-20 LAB — COMPREHENSIVE METABOLIC PANEL
ALT: 15 U/L (ref 0–53)
AST: 23 U/L (ref 0–37)
Albumin: 4 g/dL (ref 3.5–5.2)
Alkaline Phosphatase: 94 U/L (ref 39–117)
BUN: 22 mg/dL (ref 6–23)
CO2: 27 mEq/L (ref 19–32)
Calcium: 9.4 mg/dL (ref 8.4–10.5)
Chloride: 100 mEq/L (ref 96–112)
Creatinine, Ser: 0.79 mg/dL (ref 0.50–1.35)
GFR calc Af Amer: >90 mL/min (ref >90)
GFR calc non Af Amer: >90 mL/min (ref >90)
Glucose, Bld: 109 mg/dL — ABNORMAL HIGH (ref 70–99)
Potassium: 4 mEq/L (ref 3.5–5.1)
Sodium: 140 mEq/L (ref 135–145)
Total Bilirubin: 0.4 mg/dL (ref 0.3–1.2)
Total Protein: 7 g/dL (ref 6.0–8.3)

## 2013-01-20 LAB — PHOSPHORUS: Phosphorus: 4.4 mg/dL (ref 2.3–4.6)

## 2013-01-20 LAB — LACTATE DEHYDROGENASE: LDH: 242 U/L (ref 94–250)

## 2013-01-20 NOTE — Progress Notes (Signed)
Tim Duncan's reason for visit today are for labs as scheduled per MD orders.  Venipuncture performed with a 23 gauge butterfly needle to R Antecubital.  [name] tolerated venipuncture well and without incident; questions were answered and patient was discharged.

## 2013-02-10 ENCOUNTER — Encounter (HOSPITAL_COMMUNITY): Payer: Medicaid Other | Attending: Oncology

## 2013-02-10 ENCOUNTER — Telehealth (HOSPITAL_COMMUNITY): Payer: Self-pay | Admitting: *Deleted

## 2013-02-10 DIAGNOSIS — C9110 Chronic lymphocytic leukemia of B-cell type not having achieved remission: Secondary | ICD-10-CM | POA: Insufficient documentation

## 2013-02-10 LAB — DIFFERENTIAL
Basophils Absolute: 0 10*3/uL (ref 0.0–0.1)
Basophils Relative: 0 % (ref 0–1)
Eosinophils Absolute: 3.1 10*3/uL — ABNORMAL HIGH (ref 0.0–0.7)
Eosinophils Relative: 1 % (ref 0–5)
Lymphocytes Relative: 95 % — ABNORMAL HIGH (ref 12–46)
Lymphs Abs: 294.3 10*3/uL — ABNORMAL HIGH (ref 0.7–4.0)
Monocytes Absolute: 0 10*3/uL — ABNORMAL LOW (ref 0.1–1.0)
Monocytes Relative: 0 % — ABNORMAL LOW (ref 3–12)
Neutro Abs: 12.4 10*3/uL — ABNORMAL HIGH (ref 1.7–7.7)
Neutrophils Relative %: 4 % — ABNORMAL LOW (ref 43–77)

## 2013-02-10 LAB — CBC
HCT: 32 % — ABNORMAL LOW (ref 39.0–52.0)
Hemoglobin: 9.9 g/dL — ABNORMAL LOW (ref 13.0–17.0)
MCH: 32.6 pg (ref 26.0–34.0)
MCHC: 30.9 g/dL (ref 30.0–36.0)
MCV: 105.3 fL — ABNORMAL HIGH (ref 78.0–100.0)
Platelets: 175 10*3/uL (ref 150–400)
RBC: 3.04 MIL/uL — ABNORMAL LOW (ref 4.22–5.81)
RDW: 16.7 % — ABNORMAL HIGH (ref 11.5–15.5)
WBC: 309.8 10*3/uL (ref 4.0–10.5)

## 2013-02-10 NOTE — Progress Notes (Signed)
Labs drawn today for cbc/diff 

## 2013-02-10 NOTE — Telephone Encounter (Signed)
CRITICAL VALUE ALERT Critical value received:  WBC 309.8 Date of notification:  02/10/13 Time of notification: 1130  Critical value read back:  yes Nurse who received alert:  A. Dareen Piano, RN MD notified (1st page):  Dr. Willette Pa at 1145  Labs faxed to [facility]

## 2013-02-18 ENCOUNTER — Encounter (HOSPITAL_BASED_OUTPATIENT_CLINIC_OR_DEPARTMENT_OTHER): Payer: Medicaid Other

## 2013-02-18 DIAGNOSIS — C9110 Chronic lymphocytic leukemia of B-cell type not having achieved remission: Secondary | ICD-10-CM

## 2013-02-18 LAB — COMPREHENSIVE METABOLIC PANEL
ALT: 10 U/L (ref 0–53)
AST: 17 U/L (ref 0–37)
Albumin: 4.2 g/dL (ref 3.5–5.2)
Alkaline Phosphatase: 117 U/L (ref 39–117)
BUN: 27 mg/dL — ABNORMAL HIGH (ref 6–23)
CO2: 26 mEq/L (ref 19–32)
Calcium: 9.3 mg/dL (ref 8.4–10.5)
Chloride: 100 mEq/L (ref 96–112)
Creatinine, Ser: 0.89 mg/dL (ref 0.50–1.35)
GFR calc Af Amer: >90 mL/min (ref >90)
GFR calc non Af Amer: >90 mL/min (ref >90)
Glucose, Bld: 106 mg/dL — ABNORMAL HIGH (ref 70–99)
Potassium: 4.5 mEq/L (ref 3.5–5.1)
Sodium: 136 mEq/L (ref 135–145)
Total Bilirubin: 0.5 mg/dL (ref 0.3–1.2)
Total Protein: 7.4 g/dL (ref 6.0–8.3)

## 2013-02-18 LAB — DIFFERENTIAL
Band Neutrophils: 0 % (ref 0–10)
Basophils Relative: 0 % (ref 0–1)
Blasts: 22 %
Eosinophils Relative: 0 % (ref 0–5)
Lymphocytes Relative: 76 % — ABNORMAL HIGH (ref 12–46)
Metamyelocytes Relative: 0 %
Monocytes Relative: 1 % — ABNORMAL LOW (ref 3–12)
Myelocytes: 0 %
Neutrophils Relative %: 1 % — ABNORMAL LOW (ref 43–77)
Promyelocytes Absolute: 0 %
WBC Morphology: INCREASED
nRBC: 0 /100 WBC

## 2013-02-18 LAB — CBC
HCT: 31 % — ABNORMAL LOW (ref 39.0–52.0)
MCV: 126 fL — ABNORMAL HIGH (ref 78.0–100.0)
Platelets: 198 10*3/uL (ref 150–400)
RBC: 2.46 MIL/uL — ABNORMAL LOW (ref 4.22–5.81)
WBC: 613.8 10*3/uL (ref 4.0–10.5)

## 2013-02-18 LAB — LACTATE DEHYDROGENASE: LDH: 265 U/L — ABNORMAL HIGH (ref 94–250)

## 2013-02-18 LAB — URIC ACID: Uric Acid, Serum: 7.4 mg/dL (ref 4.0–7.8)

## 2013-02-18 NOTE — Progress Notes (Signed)
Labs drawn today for cbc/diff,cmp,ldh,uric acid,

## 2013-02-24 ENCOUNTER — Telehealth (HOSPITAL_COMMUNITY): Payer: Self-pay | Admitting: *Deleted

## 2013-02-24 ENCOUNTER — Encounter (HOSPITAL_BASED_OUTPATIENT_CLINIC_OR_DEPARTMENT_OTHER): Payer: Medicaid Other

## 2013-02-24 DIAGNOSIS — C9110 Chronic lymphocytic leukemia of B-cell type not having achieved remission: Secondary | ICD-10-CM

## 2013-02-24 LAB — COMPREHENSIVE METABOLIC PANEL
ALT: 26 U/L (ref 0–53)
AST: 29 U/L (ref 0–37)
Albumin: 4.1 g/dL (ref 3.5–5.2)
Alkaline Phosphatase: 125 U/L — ABNORMAL HIGH (ref 39–117)
BUN: 21 mg/dL (ref 6–23)
CO2: 27 mEq/L (ref 19–32)
Calcium: 9.1 mg/dL (ref 8.4–10.5)
Chloride: 98 mEq/L (ref 96–112)
Creatinine, Ser: 0.97 mg/dL (ref 0.50–1.35)
GFR calc Af Amer: >90 mL/min (ref >90)
GFR calc non Af Amer: 87 mL/min — ABNORMAL LOW (ref >90)
Glucose, Bld: 85 mg/dL (ref 70–99)
Potassium: 3.4 mEq/L — ABNORMAL LOW (ref 3.5–5.1)
Sodium: 137 mEq/L (ref 135–145)
Total Bilirubin: 0.4 mg/dL (ref 0.3–1.2)
Total Protein: 7.5 g/dL (ref 6.0–8.3)

## 2013-02-24 LAB — DIFFERENTIAL
Band Neutrophils: 0 % (ref 0–10)
Basophils Absolute: 0 10*3/uL (ref 0.0–0.1)
Basophils Relative: 0 % (ref 0–1)
Blasts: 0 %
Eosinophils Absolute: 0 10*3/uL (ref 0.0–0.7)
Eosinophils Relative: 0 % (ref 0–5)
Lymphocytes Relative: 97 % — ABNORMAL HIGH (ref 12–46)
Lymphs Abs: 413.7 10*3/uL — ABNORMAL HIGH (ref 0.7–4.0)
Metamyelocytes Relative: 0 %
Monocytes Absolute: 0 10*3/uL — ABNORMAL LOW (ref 0.1–1.0)
Monocytes Relative: 0 % — ABNORMAL LOW (ref 3–12)
Myelocytes: 0 %
Neutro Abs: 12.8 10*3/uL — ABNORMAL HIGH (ref 1.7–7.7)
Neutrophils Relative %: 3 % — ABNORMAL LOW (ref 43–77)
Promyelocytes Absolute: 0 %
nRBC: 0 /100 WBC

## 2013-02-24 LAB — CBC
HCT: 35.1 % — ABNORMAL LOW (ref 39.0–52.0)
Hemoglobin: 10.1 g/dL — ABNORMAL LOW (ref 13.0–17.0)
MCH: 31.7 pg (ref 26.0–34.0)
MCHC: 28.8 g/dL — ABNORMAL LOW (ref 30.0–36.0)
MCV: 110 fL — ABNORMAL HIGH (ref 78.0–100.0)
Platelets: 217 10*3/uL (ref 150–400)
RBC: 3.19 MIL/uL — ABNORMAL LOW (ref 4.22–5.81)
RDW: 18.6 % — ABNORMAL HIGH (ref 11.5–15.5)
WBC: 426.5 10*3/uL (ref 4.0–10.5)

## 2013-02-24 LAB — URIC ACID: Uric Acid, Serum: 8.4 mg/dL — ABNORMAL HIGH (ref 4.0–7.8)

## 2013-02-24 LAB — LACTATE DEHYDROGENASE: LDH: 269 U/L — ABNORMAL HIGH (ref 94–250)

## 2013-02-24 NOTE — Telephone Encounter (Signed)
.  CRITICAL VALUE ALERT Critical value received:  wbc Date of notification:  02/24/2013 Time of notification: 1130 Critical value read back:  yes Nurse who received alert:  TAR MD notified (1st page):  [facility]

## 2013-02-24 NOTE — Progress Notes (Signed)
Labs drawn today for cbc/diff,cmp,uric acid,ldh 

## 2013-03-02 ENCOUNTER — Other Ambulatory Visit (HOSPITAL_COMMUNITY): Payer: Medicaid Other

## 2013-03-03 ENCOUNTER — Other Ambulatory Visit (HOSPITAL_COMMUNITY): Payer: Medicaid Other

## 2013-03-04 ENCOUNTER — Encounter (HOSPITAL_COMMUNITY): Payer: Medicaid Other | Attending: Oncology

## 2013-03-04 DIAGNOSIS — C9110 Chronic lymphocytic leukemia of B-cell type not having achieved remission: Secondary | ICD-10-CM | POA: Insufficient documentation

## 2013-03-04 DIAGNOSIS — D649 Anemia, unspecified: Secondary | ICD-10-CM | POA: Insufficient documentation

## 2013-03-04 LAB — DIFFERENTIAL
Band Neutrophils: 0 % (ref 0–10)
Basophils Absolute: 0 10*3/uL (ref 0.0–0.1)
Basophils Relative: 0 % (ref 0–1)
Blasts: 0 %
Eosinophils Absolute: 0 10*3/uL (ref 0.0–0.7)
Eosinophils Relative: 0 % (ref 0–5)
Lymphocytes Relative: 98 % — ABNORMAL HIGH (ref 12–46)
Lymphs Abs: 347.6 10*3/uL — ABNORMAL HIGH (ref 0.7–4.0)
Metamyelocytes Relative: 0 %
Monocytes Absolute: 0 10*3/uL — ABNORMAL LOW (ref 0.1–1.0)
Monocytes Relative: 0 % — ABNORMAL LOW (ref 3–12)
Myelocytes: 0 %
Neutro Abs: 7.1 10*3/uL (ref 1.7–7.7)
Neutrophils Relative %: 2 % — ABNORMAL LOW (ref 43–77)
Promyelocytes Absolute: 0 %
nRBC: 0 /100 WBC

## 2013-03-04 LAB — URIC ACID: Uric Acid, Serum: 8.4 mg/dL — ABNORMAL HIGH (ref 4.0–7.8)

## 2013-03-04 LAB — COMPREHENSIVE METABOLIC PANEL
ALT: 11 U/L (ref 0–53)
AST: 19 U/L (ref 0–37)
Albumin: 3.9 g/dL (ref 3.5–5.2)
Alkaline Phosphatase: 88 U/L (ref 39–117)
BUN: 16 mg/dL (ref 6–23)
CO2: 27 mEq/L (ref 19–32)
Calcium: 8.9 mg/dL (ref 8.4–10.5)
Chloride: 103 mEq/L (ref 96–112)
Creatinine, Ser: 0.9 mg/dL (ref 0.50–1.35)
GFR calc Af Amer: >90 mL/min (ref >90)
GFR calc non Af Amer: >90 mL/min (ref >90)
Glucose, Bld: 116 mg/dL — ABNORMAL HIGH (ref 70–99)
Potassium: 3.2 mEq/L — ABNORMAL LOW (ref 3.5–5.1)
Sodium: 141 mEq/L (ref 135–145)
Total Bilirubin: 0.5 mg/dL (ref 0.3–1.2)
Total Protein: 7 g/dL (ref 6.0–8.3)

## 2013-03-04 LAB — CBC
HCT: 28.3 % — ABNORMAL LOW (ref 39.0–52.0)
Hemoglobin: 8.8 g/dL — ABNORMAL LOW (ref 13.0–17.0)
MCH: 32.8 pg (ref 26.0–34.0)
MCHC: 31.1 g/dL (ref 30.0–36.0)
MCV: 105.6 fL — ABNORMAL HIGH (ref 78.0–100.0)
Platelets: 205 10*3/uL (ref 150–400)
RBC: 2.68 MIL/uL — ABNORMAL LOW (ref 4.22–5.81)
RDW: 17.3 % — ABNORMAL HIGH (ref 11.5–15.5)
WBC: 354.7 10*3/uL (ref 4.0–10.5)

## 2013-03-04 LAB — LACTATE DEHYDROGENASE: LDH: 296 U/L — ABNORMAL HIGH (ref 94–250)

## 2013-03-04 NOTE — Progress Notes (Signed)
Labs drawn today for cbc/diff,cmp,ldh 

## 2013-03-04 NOTE — Progress Notes (Signed)
CRITICAL VALUE ALERT  Critical value received:  WBC 354.7  Date of notification: 03/04/13    Time of notification: 1130  Critical value read back:yes  Nurse who received alert:  Rosana Berger RN  MD notified: Dr. Zigmund Daniel  Fax labs to [facility].

## 2013-03-10 ENCOUNTER — Telehealth (HOSPITAL_COMMUNITY): Payer: Self-pay

## 2013-03-10 ENCOUNTER — Encounter (HOSPITAL_BASED_OUTPATIENT_CLINIC_OR_DEPARTMENT_OTHER): Payer: Medicaid Other

## 2013-03-10 DIAGNOSIS — D649 Anemia, unspecified: Secondary | ICD-10-CM

## 2013-03-10 DIAGNOSIS — C9110 Chronic lymphocytic leukemia of B-cell type not having achieved remission: Secondary | ICD-10-CM

## 2013-03-10 LAB — DIFFERENTIAL
Band Neutrophils: 0 % (ref 0–10)
Basophils Absolute: 0 10*3/uL (ref 0.0–0.1)
Basophils Relative: 0 % (ref 0–1)
Blasts: 0 %
Eosinophils Absolute: 0 10*3/uL (ref 0.0–0.7)
Eosinophils Relative: 0 % (ref 0–5)
Lymphocytes Relative: 91 % — ABNORMAL HIGH (ref 12–46)
Lymphs Abs: 360.6 10*3/uL — ABNORMAL HIGH (ref 0.7–4.0)
Metamyelocytes Relative: 0 %
Monocytes Absolute: 15.9 10*3/uL — ABNORMAL HIGH (ref 0.1–1.0)
Monocytes Relative: 4 % (ref 3–12)
Myelocytes: 0 %
Neutro Abs: 19.8 10*3/uL — ABNORMAL HIGH (ref 1.7–7.7)
Neutrophils Relative %: 5 % — ABNORMAL LOW (ref 43–77)
Promyelocytes Absolute: 0 %
nRBC: 0 /100 WBC

## 2013-03-10 LAB — COMPREHENSIVE METABOLIC PANEL
ALT: 10 U/L (ref 0–53)
AST: 24 U/L (ref 0–37)
Albumin: 4.1 g/dL (ref 3.5–5.2)
Alkaline Phosphatase: 91 U/L (ref 39–117)
BUN: 18 mg/dL (ref 6–23)
CO2: 23 mEq/L (ref 19–32)
Calcium: 8.8 mg/dL (ref 8.4–10.5)
Chloride: 103 mEq/L (ref 96–112)
Creatinine, Ser: 0.91 mg/dL (ref 0.50–1.35)
GFR calc Af Amer: >90 mL/min (ref >90)
GFR calc non Af Amer: 90 mL/min — ABNORMAL LOW (ref >90)
Glucose, Bld: 96 mg/dL (ref 70–99)
Potassium: 3.8 mEq/L (ref 3.5–5.1)
Sodium: 141 mEq/L (ref 135–145)
Total Bilirubin: 0.6 mg/dL (ref 0.3–1.2)
Total Protein: 7.2 g/dL (ref 6.0–8.3)

## 2013-03-10 LAB — CBC
HCT: 29.9 % — ABNORMAL LOW (ref 39.0–52.0)
Hemoglobin: 8.5 g/dL — ABNORMAL LOW (ref 13.0–17.0)
MCH: 31.6 pg (ref 26.0–34.0)
MCHC: 28.4 g/dL — ABNORMAL LOW (ref 30.0–36.0)
MCV: 111.2 fL — ABNORMAL HIGH (ref 78.0–100.0)
Platelets: 215 10*3/uL (ref 150–400)
RBC: 2.69 MIL/uL — ABNORMAL LOW (ref 4.22–5.81)
RDW: 20.2 % — ABNORMAL HIGH (ref 11.5–15.5)
WBC: 396.3 10*3/uL (ref 4.0–10.5)

## 2013-03-10 LAB — URIC ACID: Uric Acid, Serum: 8.4 mg/dL — ABNORMAL HIGH (ref 4.0–7.8)

## 2013-03-10 LAB — PREPARE RBC (CROSSMATCH)

## 2013-03-10 LAB — HAPTOGLOBIN: Haptoglobin: 109 mg/dL (ref 45–215)

## 2013-03-10 LAB — LACTATE DEHYDROGENASE: LDH: 329 U/L — ABNORMAL HIGH (ref 94–250)

## 2013-03-10 NOTE — Progress Notes (Signed)
Labs drawn today for cbc/diff,cmp,uric acid,ldh,haptoglobin

## 2013-03-10 NOTE — Telephone Encounter (Signed)
CRITICAL VALUE ALERT Critical value received:  WBC 396.3 Date of notification:  03/10/13 Time of notification: 12 noon Critical value read back:  yes Nurse who received alert:  Tobie Lords, RN MD notified (1st page):  Dellis Anes, PA-C notified and critical result called to Tamera @ [facility] and results faxes also.

## 2013-03-10 NOTE — Addendum Note (Signed)
Addended by: Edythe Lynn A on: 03/10/2013 03:19 PM   Modules accepted: Orders, SmartSet

## 2013-03-11 ENCOUNTER — Encounter (HOSPITAL_BASED_OUTPATIENT_CLINIC_OR_DEPARTMENT_OTHER): Payer: Medicaid Other

## 2013-03-11 VITALS — BP 128/77 | HR 83 | Temp 98.5°F | Resp 20 | Wt 220.6 lb

## 2013-03-11 DIAGNOSIS — D649 Anemia, unspecified: Secondary | ICD-10-CM

## 2013-03-11 DIAGNOSIS — C9110 Chronic lymphocytic leukemia of B-cell type not having achieved remission: Secondary | ICD-10-CM

## 2013-03-11 MED ORDER — SODIUM CHLORIDE 0.9 % IJ SOLN
10.0000 mL | INTRAMUSCULAR | Status: AC | PRN
  Administered 2013-03-11: 10 mL

## 2013-03-11 MED ORDER — HEPARIN SOD (PORK) LOCK FLUSH 100 UNIT/ML IV SOLN
500.0000 [IU] | Freq: Every day | INTRAVENOUS | Status: AC | PRN
  Administered 2013-03-11: 500 [IU]
  Filled 2013-03-11: qty 5

## 2013-03-11 MED ORDER — SODIUM CHLORIDE 0.9 % IV SOLN
250.0000 mL | Freq: Once | INTRAVENOUS | Status: AC
  Administered 2013-03-11: 250 mL via INTRAVENOUS

## 2013-03-11 NOTE — Progress Notes (Signed)
Double portacath accessed on left side of port with 20g huber needle; good blood return noted.  Flushed with NS 10ml, then NS infusing KVO. Dressed with gauze and transparent dsg.

## 2013-03-12 LAB — TYPE AND SCREEN
ABO/RH(D): A POS
Antibody Screen: NEGATIVE
Unit division: 0
Unit division: 0

## 2013-03-16 ENCOUNTER — Other Ambulatory Visit (HOSPITAL_COMMUNITY): Payer: Self-pay | Admitting: Oncology

## 2013-03-16 DIAGNOSIS — C9110 Chronic lymphocytic leukemia of B-cell type not having achieved remission: Secondary | ICD-10-CM

## 2013-03-17 ENCOUNTER — Telehealth (HOSPITAL_COMMUNITY): Payer: Self-pay

## 2013-03-17 ENCOUNTER — Encounter (HOSPITAL_BASED_OUTPATIENT_CLINIC_OR_DEPARTMENT_OTHER): Payer: Medicaid Other

## 2013-03-17 DIAGNOSIS — C9110 Chronic lymphocytic leukemia of B-cell type not having achieved remission: Secondary | ICD-10-CM

## 2013-03-17 LAB — CBC WITH DIFFERENTIAL/PLATELET
Basophils Relative: 0 % (ref 0–1)
Eosinophils Relative: 0 % (ref 0–5)
HCT: 32.5 % — ABNORMAL LOW (ref 39.0–52.0)
Hemoglobin: 9.6 g/dL — ABNORMAL LOW (ref 13.0–17.0)
Lymphocytes Relative: 97 % — ABNORMAL HIGH (ref 12–46)
MCH: 33 pg (ref 26.0–34.0)
MCHC: 29.5 g/dL — ABNORMAL LOW (ref 30.0–36.0)
MCV: 111.7 fL — ABNORMAL HIGH (ref 78.0–100.0)
Monocytes Relative: 0 % — ABNORMAL LOW (ref 3–12)
Neutrophils Relative %: 3 % — ABNORMAL LOW (ref 43–77)
Platelets: 174 10*3/uL (ref 150–400)
RBC: 2.91 MIL/uL — ABNORMAL LOW (ref 4.22–5.81)
RDW: 20.8 % — ABNORMAL HIGH (ref 11.5–15.5)
WBC: 448.3 10*3/uL (ref 4.0–10.5)

## 2013-03-17 LAB — COMPREHENSIVE METABOLIC PANEL
ALT: 13 U/L (ref 0–53)
AST: 32 U/L (ref 0–37)
Albumin: 4.3 g/dL (ref 3.5–5.2)
Alkaline Phosphatase: 101 U/L (ref 39–117)
BUN: 12 mg/dL (ref 6–23)
CO2: 25 mEq/L (ref 19–32)
Calcium: 9.1 mg/dL (ref 8.4–10.5)
Chloride: 100 mEq/L (ref 96–112)
Creatinine, Ser: 0.88 mg/dL (ref 0.50–1.35)
GFR calc Af Amer: >90 mL/min (ref >90)
GFR calc non Af Amer: >90 mL/min (ref >90)
Glucose, Bld: 87 mg/dL (ref 70–99)
Potassium: 4.3 mEq/L (ref 3.5–5.1)
Sodium: 136 mEq/L (ref 135–145)
Total Bilirubin: 0.7 mg/dL (ref 0.3–1.2)
Total Protein: 7.7 g/dL (ref 6.0–8.3)

## 2013-03-17 LAB — URIC ACID: Uric Acid, Serum: 8 mg/dL — ABNORMAL HIGH (ref 4.0–7.8)

## 2013-03-17 LAB — LACTATE DEHYDROGENASE: LDH: 389 U/L — ABNORMAL HIGH (ref 94–250)

## 2013-03-17 NOTE — Telephone Encounter (Signed)
Critical WBC of 448,300 called to Tamera at [facility] and results faxed.

## 2013-03-17 NOTE — Progress Notes (Signed)
[name] presented for labwork. Labs per MD order drawn via Peripheral Line 23 gauge needle inserted in right antecubital.  Good blood return present. Procedure without incident.  Needle removed intact. Patient tolerated procedure well.

## 2013-03-17 NOTE — Telephone Encounter (Signed)
CRITICAL VALUE ALERT Critical value received:  WBC 448,300 Date of notification:  03/17/13 Time of notification: 1031  Critical value read back:  yes Nurse who received alert:  Beatris Si Reported to Dellis Anes, PA-C

## 2013-03-23 ENCOUNTER — Encounter (HOSPITAL_BASED_OUTPATIENT_CLINIC_OR_DEPARTMENT_OTHER): Payer: Medicaid Other

## 2013-03-23 ENCOUNTER — Telehealth (HOSPITAL_COMMUNITY): Payer: Self-pay

## 2013-03-23 DIAGNOSIS — C9110 Chronic lymphocytic leukemia of B-cell type not having achieved remission: Secondary | ICD-10-CM

## 2013-03-23 LAB — CBC WITH DIFFERENTIAL/PLATELET
Band Neutrophils: 0 % (ref 0–10)
Basophils Absolute: 0 10*3/uL (ref 0.0–0.1)
Basophils Relative: 0 % (ref 0–1)
Blasts: 0 %
Eosinophils Absolute: 7.2 10*3/uL — ABNORMAL HIGH (ref 0.0–0.7)
Eosinophils Relative: 1 % (ref 0–5)
HCT: 30.3 % — ABNORMAL LOW (ref 39.0–52.0)
Hemoglobin: 9.8 g/dL — ABNORMAL LOW (ref 13.0–17.0)
Lymphocytes Relative: 97 % — ABNORMAL HIGH (ref 12–46)
Lymphs Abs: 697.5 10*3/uL — ABNORMAL HIGH (ref 0.7–4.0)
MCH: 35.4 pg — ABNORMAL HIGH (ref 26.0–34.0)
MCHC: 32.3 g/dL (ref 30.0–36.0)
MCV: 109.4 fL — ABNORMAL HIGH (ref 78.0–100.0)
Metamyelocytes Relative: 0 %
Monocytes Absolute: 0 10*3/uL — ABNORMAL LOW (ref 0.1–1.0)
Monocytes Relative: 0 % — ABNORMAL LOW (ref 3–12)
Myelocytes: 0 %
Neutro Abs: 14.4 10*3/uL — ABNORMAL HIGH (ref 1.7–7.7)
Neutrophils Relative %: 2 % — ABNORMAL LOW (ref 43–77)
Platelets: 155 10*3/uL (ref 150–400)
Promyelocytes Absolute: 0 %
RBC: 2.77 MIL/uL — ABNORMAL LOW (ref 4.22–5.81)
RDW: 20.4 % — ABNORMAL HIGH (ref 11.5–15.5)
WBC: 719.1 10*3/uL (ref 4.0–10.5)
nRBC: 0 /100 WBC

## 2013-03-23 LAB — COMPREHENSIVE METABOLIC PANEL
ALT: 12 U/L (ref 0–53)
AST: 19 U/L (ref 0–37)
Albumin: 4.5 g/dL (ref 3.5–5.2)
Alkaline Phosphatase: 98 U/L (ref 39–117)
BUN: 26 mg/dL — ABNORMAL HIGH (ref 6–23)
CO2: 24 mEq/L (ref 19–32)
Calcium: 9.5 mg/dL (ref 8.4–10.5)
Chloride: 96 mEq/L (ref 96–112)
Creatinine, Ser: 0.76 mg/dL (ref 0.50–1.35)
GFR calc Af Amer: >90 mL/min (ref >90)
GFR calc non Af Amer: >90 mL/min (ref >90)
Glucose, Bld: 77 mg/dL (ref 70–99)
Potassium: 4.6 mEq/L (ref 3.5–5.1)
Sodium: 136 mEq/L (ref 135–145)
Total Bilirubin: 0.4 mg/dL (ref 0.3–1.2)
Total Protein: 8.1 g/dL (ref 6.0–8.3)

## 2013-03-23 LAB — URIC ACID: Uric Acid, Serum: 9.6 mg/dL — ABNORMAL HIGH (ref 4.0–7.8)

## 2013-03-23 LAB — LACTATE DEHYDROGENASE: LDH: 270 U/L — ABNORMAL HIGH (ref 94–250)

## 2013-03-23 NOTE — Telephone Encounter (Signed)
CRITICAL VALUE ALERT Critical value received:  WBC 719,120  Date of notification:  03/23/13 Time of notification: 1210 Critical value read back:  yes Nurse who received alert:  Tobie Lords, RN MD notified (1st page):  Dr. Zigmund Daniel and results faxed to [facility]

## 2013-03-23 NOTE — Progress Notes (Signed)
Labs drawn today for cbc/diff,cmp,ldh,uric acid 

## 2013-03-24 ENCOUNTER — Other Ambulatory Visit (HOSPITAL_COMMUNITY): Payer: Medicaid Other

## 2013-03-30 ENCOUNTER — Encounter (HOSPITAL_BASED_OUTPATIENT_CLINIC_OR_DEPARTMENT_OTHER): Payer: Medicaid Other

## 2013-03-30 DIAGNOSIS — C9110 Chronic lymphocytic leukemia of B-cell type not having achieved remission: Secondary | ICD-10-CM

## 2013-03-30 LAB — CBC WITH DIFFERENTIAL/PLATELET
Band Neutrophils: 0 % (ref 0–10)
Basophils Absolute: 0 10*3/uL (ref 0.0–0.1)
Basophils Relative: 0 % (ref 0–1)
Blasts: 0 %
Eosinophils Absolute: 0 10*3/uL (ref 0.0–0.7)
Eosinophils Relative: 0 % (ref 0–5)
HCT: 31.4 % — ABNORMAL LOW (ref 39.0–52.0)
Hemoglobin: 9.8 g/dL — ABNORMAL LOW (ref 13.0–17.0)
Lymphocytes Relative: 99 % — ABNORMAL HIGH (ref 12–46)
Lymphs Abs: 582.8 10*3/uL — ABNORMAL HIGH (ref 0.7–4.0)
MCH: 35.1 pg — ABNORMAL HIGH (ref 26.0–34.0)
MCHC: 31.2 g/dL (ref 30.0–36.0)
MCV: 112.5 fL — ABNORMAL HIGH (ref 78.0–100.0)
Metamyelocytes Relative: 0 %
Monocytes Absolute: 0 10*3/uL — ABNORMAL LOW (ref 0.1–1.0)
Monocytes Relative: 0 % — ABNORMAL LOW (ref 3–12)
Myelocytes: 0 %
Neutro Abs: 5.9 10*3/uL (ref 1.7–7.7)
Neutrophils Relative %: 1 % — ABNORMAL LOW (ref 43–77)
Platelets: 200 10*3/uL (ref 150–400)
Promyelocytes Absolute: 0 %
RBC: 2.79 MIL/uL — ABNORMAL LOW (ref 4.22–5.81)
RDW: 20.8 % — ABNORMAL HIGH (ref 11.5–15.5)
WBC: 588.7 10*3/uL (ref 4.0–10.5)
nRBC: 0 /100 WBC

## 2013-03-30 LAB — COMPREHENSIVE METABOLIC PANEL
ALT: 91 U/L — ABNORMAL HIGH (ref 0–53)
AST: 194 U/L — ABNORMAL HIGH (ref 0–37)
Albumin: 4.1 g/dL (ref 3.5–5.2)
Alkaline Phosphatase: 86 U/L (ref 39–117)
BUN: 24 mg/dL — ABNORMAL HIGH (ref 6–23)
CO2: 27 mEq/L (ref 19–32)
Calcium: 7.9 mg/dL — ABNORMAL LOW (ref 8.4–10.5)
Chloride: 98 mEq/L (ref 96–112)
Creatinine, Ser: 0.78 mg/dL (ref 0.50–1.35)
GFR calc Af Amer: >90 mL/min (ref >90)
GFR calc non Af Amer: >90 mL/min (ref >90)
Glucose, Bld: 151 mg/dL — ABNORMAL HIGH (ref 70–99)
Potassium: >7.7 mEq/L (ref 3.7–5.3)
Sodium: 136 mEq/L — ABNORMAL LOW (ref 137–147)
Total Bilirubin: 0.5 mg/dL (ref 0.3–1.2)
Total Protein: 7.4 g/dL (ref 6.0–8.3)

## 2013-03-30 LAB — LACTATE DEHYDROGENASE: LDH: 283 U/L — ABNORMAL HIGH (ref 94–250)

## 2013-03-30 LAB — URIC ACID: Uric Acid, Serum: 8.3 mg/dL — ABNORMAL HIGH (ref 4.0–7.8)

## 2013-03-30 NOTE — Progress Notes (Signed)
Labs drawn today for cbc/diff,cmp,uric acid,ldh 

## 2013-03-31 ENCOUNTER — Other Ambulatory Visit (HOSPITAL_COMMUNITY): Payer: Medicaid Other

## 2013-04-07 ENCOUNTER — Other Ambulatory Visit: Payer: Self-pay

## 2013-04-07 ENCOUNTER — Encounter (HOSPITAL_COMMUNITY): Payer: Medicaid Other | Attending: Oncology

## 2013-04-07 DIAGNOSIS — C9110 Chronic lymphocytic leukemia of B-cell type not having achieved remission: Secondary | ICD-10-CM | POA: Insufficient documentation

## 2013-04-07 LAB — COMPREHENSIVE METABOLIC PANEL
ALT: 29 U/L (ref 0–53)
AST: 23 U/L (ref 0–37)
Albumin: 4.4 g/dL (ref 3.5–5.2)
Alkaline Phosphatase: 75 U/L (ref 39–117)
BUN: 20 mg/dL (ref 6–23)
CO2: 27 mEq/L (ref 19–32)
Calcium: 9.5 mg/dL (ref 8.4–10.5)
Chloride: 102 mEq/L (ref 96–112)
Creatinine, Ser: 0.78 mg/dL (ref 0.50–1.35)
GFR calc Af Amer: >90 mL/min (ref >90)
GFR calc non Af Amer: >90 mL/min (ref >90)
Glucose, Bld: 81 mg/dL (ref 70–99)
Potassium: 4.5 mEq/L (ref 3.7–5.3)
Sodium: 141 mEq/L (ref 137–147)
Total Bilirubin: 0.5 mg/dL (ref 0.3–1.2)
Total Protein: 7.9 g/dL (ref 6.0–8.3)

## 2013-04-07 LAB — CBC WITH DIFFERENTIAL/PLATELET
Band Neutrophils: 0 % (ref 0–10)
Basophils Absolute: 0 10*3/uL (ref 0.0–0.1)
Basophils Relative: 0 % (ref 0–1)
Blasts: 0 %
Eosinophils Absolute: 0 10*3/uL (ref 0.0–0.7)
Eosinophils Relative: 0 % (ref 0–5)
HCT: 39.5 % (ref 39.0–52.0)
Hemoglobin: 10.4 g/dL — ABNORMAL LOW (ref 13.0–17.0)
Lymphocytes Relative: 95 % — ABNORMAL HIGH (ref 12–46)
Lymphs Abs: 408.5 10*3/uL — ABNORMAL HIGH (ref 0.7–4.0)
MCH: 32.2 pg (ref 26.0–34.0)
MCHC: 26.3 g/dL — ABNORMAL LOW (ref 30.0–36.0)
MCV: 122.3 fL — ABNORMAL HIGH (ref 78.0–100.0)
Metamyelocytes Relative: 0 %
Monocytes Absolute: 4.3 10*3/uL — ABNORMAL HIGH (ref 0.1–1.0)
Monocytes Relative: 1 % — ABNORMAL LOW (ref 3–12)
Myelocytes: 0 %
Neutro Abs: 17.2 10*3/uL — ABNORMAL HIGH (ref 1.7–7.7)
Neutrophils Relative %: 4 % — ABNORMAL LOW (ref 43–77)
Platelets: 189 10*3/uL (ref 150–400)
Promyelocytes Absolute: 0 %
RBC: 3.23 MIL/uL — ABNORMAL LOW (ref 4.22–5.81)
RDW: 22.1 % — ABNORMAL HIGH (ref 11.5–15.5)
WBC: 430 10*3/uL (ref 4.0–10.5)
nRBC: 0 /100 WBC

## 2013-04-07 LAB — URIC ACID: Uric Acid, Serum: 6.9 mg/dL (ref 4.0–7.8)

## 2013-04-07 LAB — LACTATE DEHYDROGENASE: LDH: 178 U/L (ref 94–250)

## 2013-04-07 NOTE — Progress Notes (Signed)
Labs drawn today for cbc/diff,cmp,ldh,uric acid 

## 2013-04-07 NOTE — Progress Notes (Signed)
Patient relayed to phlebotomist that he was told by [facility] that he had an elevated potassium level and needed to wait on labs and have an EKG performed. No orders received from [facility] for patient. Call made to [phone] to request orders. Patient to lobby to wait on results.

## 2013-04-14 ENCOUNTER — Other Ambulatory Visit (HOSPITAL_COMMUNITY): Payer: Medicaid Other

## 2013-04-15 ENCOUNTER — Encounter (HOSPITAL_BASED_OUTPATIENT_CLINIC_OR_DEPARTMENT_OTHER): Payer: Medicaid Other

## 2013-04-15 ENCOUNTER — Telehealth (HOSPITAL_COMMUNITY): Payer: Self-pay

## 2013-04-15 DIAGNOSIS — C9110 Chronic lymphocytic leukemia of B-cell type not having achieved remission: Secondary | ICD-10-CM

## 2013-04-15 LAB — LACTATE DEHYDROGENASE: LDH: 168 U/L (ref 94–250)

## 2013-04-15 LAB — CBC WITH DIFFERENTIAL/PLATELET
Band Neutrophils: 0 % (ref 0–10)
Basophils Absolute: 0 10*3/uL (ref 0.0–0.1)
Basophils Relative: 0 % (ref 0–1)
Blasts: 0 %
Eosinophils Absolute: 0 10*3/uL (ref 0.0–0.7)
Eosinophils Relative: 0 % (ref 0–5)
HCT: 36.8 % — ABNORMAL LOW (ref 39.0–52.0)
Hemoglobin: 10.6 g/dL — ABNORMAL LOW (ref 13.0–17.0)
Lymphocytes Relative: 95 % — ABNORMAL HIGH (ref 12–46)
Lymphs Abs: 317.7 10*3/uL — ABNORMAL HIGH (ref 0.7–4.0)
MCH: 34.8 pg — ABNORMAL HIGH (ref 26.0–34.0)
MCHC: 28.8 g/dL — ABNORMAL LOW (ref 30.0–36.0)
MCV: 120.7 fL — ABNORMAL HIGH (ref 78.0–100.0)
Metamyelocytes Relative: 0 %
Monocytes Absolute: 6.7 10*3/uL — ABNORMAL HIGH (ref 0.1–1.0)
Monocytes Relative: 2 % — ABNORMAL LOW (ref 3–12)
Myelocytes: 0 %
Neutro Abs: 10 10*3/uL — ABNORMAL HIGH (ref 1.7–7.7)
Neutrophils Relative %: 3 % — ABNORMAL LOW (ref 43–77)
Platelets: 158 10*3/uL (ref 150–400)
Promyelocytes Absolute: 0 %
RBC: 3.05 MIL/uL — ABNORMAL LOW (ref 4.22–5.81)
WBC: 334.4 10*3/uL (ref 4.0–10.5)
nRBC: 0 /100 WBC

## 2013-04-15 LAB — COMPREHENSIVE METABOLIC PANEL
ALT: 30 U/L (ref 0–53)
AST: 25 U/L (ref 0–37)
Albumin: 4.3 g/dL (ref 3.5–5.2)
Alkaline Phosphatase: 66 U/L (ref 39–117)
BUN: 17 mg/dL (ref 6–23)
CO2: 28 mEq/L (ref 19–32)
Calcium: 9.1 mg/dL (ref 8.4–10.5)
Chloride: 100 mEq/L (ref 96–112)
Creatinine, Ser: 0.67 mg/dL (ref 0.50–1.35)
GFR calc Af Amer: >90 mL/min (ref >90)
GFR calc non Af Amer: >90 mL/min (ref >90)
Glucose, Bld: 93 mg/dL (ref 70–99)
Potassium: 4.5 mEq/L (ref 3.7–5.3)
Sodium: 141 mEq/L (ref 137–147)
Total Bilirubin: 0.5 mg/dL (ref 0.3–1.2)
Total Protein: 7.5 g/dL (ref 6.0–8.3)

## 2013-04-15 LAB — URIC ACID: Uric Acid, Serum: 6.2 mg/dL (ref 4.0–7.8)

## 2013-04-15 NOTE — Telephone Encounter (Signed)
CRITICAL VALUE ALERT Critical value received:  WBC 334,400 Date of notification:  04/15/13  Time of notification: 1010  Critical value read back:  yes Nurse who received alert:  Mickie Kay, RN MD notified (1st page):  Robynn Pane, PA- C and results called and faxed to [facility] to [place].

## 2013-04-15 NOTE — Progress Notes (Signed)
Labs drawn today for cbc/diff,cmp,uric acid,,ldh

## 2013-04-21 ENCOUNTER — Encounter (HOSPITAL_BASED_OUTPATIENT_CLINIC_OR_DEPARTMENT_OTHER): Payer: Medicaid Other

## 2013-04-21 DIAGNOSIS — C9110 Chronic lymphocytic leukemia of B-cell type not having achieved remission: Secondary | ICD-10-CM

## 2013-04-21 LAB — CBC WITH DIFFERENTIAL/PLATELET
Band Neutrophils: 0 % (ref 0–10)
Basophils Absolute: 0 10*3/uL (ref 0.0–0.1)
Basophils Relative: 0 % (ref 0–1)
Blasts: 0 %
Eosinophils Absolute: 0 10*3/uL (ref 0.0–0.7)
Eosinophils Relative: 0 % (ref 0–5)
HCT: 38.6 % — ABNORMAL LOW (ref 39.0–52.0)
Hemoglobin: 11.2 g/dL — ABNORMAL LOW (ref 13.0–17.0)
Lymphocytes Relative: 98 % — ABNORMAL HIGH (ref 12–46)
Lymphs Abs: 296.6 10*3/uL — ABNORMAL HIGH (ref 0.7–4.0)
MCH: 35 pg — ABNORMAL HIGH (ref 26.0–34.0)
MCHC: 29 g/dL — ABNORMAL LOW (ref 30.0–36.0)
MCV: 120.6 fL — ABNORMAL HIGH (ref 78.0–100.0)
Metamyelocytes Relative: 0 %
Monocytes Absolute: 0 10*3/uL — ABNORMAL LOW (ref 0.1–1.0)
Monocytes Relative: 0 % — ABNORMAL LOW (ref 3–12)
Myelocytes: 0 %
Neutro Abs: 6.1 10*3/uL (ref 1.7–7.7)
Neutrophils Relative %: 2 % — ABNORMAL LOW (ref 43–77)
Platelets: 192 10*3/uL (ref 150–400)
Promyelocytes Absolute: 0 %
RBC: 3.2 MIL/uL — ABNORMAL LOW (ref 4.22–5.81)
WBC: 302.7 10*3/uL (ref 4.0–10.5)
nRBC: 0 /100 WBC

## 2013-04-21 LAB — COMPREHENSIVE METABOLIC PANEL
ALT: 31 U/L (ref 0–53)
AST: 23 U/L (ref 0–37)
Albumin: 4.3 g/dL (ref 3.5–5.2)
Alkaline Phosphatase: 62 U/L (ref 39–117)
BUN: 13 mg/dL (ref 6–23)
CO2: 28 mEq/L (ref 19–32)
Calcium: 9.5 mg/dL (ref 8.4–10.5)
Chloride: 100 mEq/L (ref 96–112)
Creatinine, Ser: 0.71 mg/dL (ref 0.50–1.35)
GFR calc Af Amer: >90 mL/min (ref >90)
GFR calc non Af Amer: >90 mL/min (ref >90)
Glucose, Bld: 95 mg/dL (ref 70–99)
Potassium: 4.1 mEq/L (ref 3.7–5.3)
Sodium: 140 mEq/L (ref 137–147)
Total Bilirubin: 0.5 mg/dL (ref 0.3–1.2)
Total Protein: 7.5 g/dL (ref 6.0–8.3)

## 2013-04-21 LAB — URIC ACID: Uric Acid, Serum: 7 mg/dL (ref 4.0–7.8)

## 2013-04-21 LAB — LACTATE DEHYDROGENASE: LDH: 172 U/L (ref 94–250)

## 2013-04-21 NOTE — Progress Notes (Signed)
CRITICAL VALUE ALERT  Critical value received:  WBC 302.6  Date of notification:  04/21/13  Time of notification:  1100  Critical value read back:yes  Nurse who received alert:  Isidoro Donning RN  Kirby Crigler PA-C notified verbally and labs faxed to [facility].

## 2013-04-21 NOTE — Progress Notes (Signed)
Labs drawn today for cbc/diff,cmp,uric acid,ldh

## 2013-05-10 ENCOUNTER — Other Ambulatory Visit (HOSPITAL_COMMUNITY): Payer: Self-pay

## 2013-05-10 DIAGNOSIS — D649 Anemia, unspecified: Secondary | ICD-10-CM

## 2013-05-10 DIAGNOSIS — C9110 Chronic lymphocytic leukemia of B-cell type not having achieved remission: Secondary | ICD-10-CM

## 2013-05-12 ENCOUNTER — Other Ambulatory Visit (HOSPITAL_COMMUNITY): Payer: Medicaid Other

## 2013-05-12 ENCOUNTER — Encounter (HOSPITAL_COMMUNITY): Payer: Medicaid Other | Attending: Oncology

## 2013-05-12 DIAGNOSIS — C9110 Chronic lymphocytic leukemia of B-cell type not having achieved remission: Secondary | ICD-10-CM

## 2013-05-12 DIAGNOSIS — D649 Anemia, unspecified: Secondary | ICD-10-CM | POA: Insufficient documentation

## 2013-05-12 LAB — CBC WITH DIFFERENTIAL/PLATELET
Basophils Absolute: 0.8 10*3/uL — ABNORMAL HIGH (ref 0.0–0.1)
Basophils Relative: 0 % (ref 0–1)
Eosinophils Absolute: 0.2 10*3/uL (ref 0.0–0.7)
Eosinophils Relative: 0 % (ref 0–5)
HCT: 38.8 % — ABNORMAL LOW (ref 39.0–52.0)
Hemoglobin: 11.6 g/dL — ABNORMAL LOW (ref 13.0–17.0)
Lymphocytes Relative: 96 % — ABNORMAL HIGH (ref 12–46)
Lymphs Abs: 183.5 10*3/uL — ABNORMAL HIGH (ref 0.7–4.0)
MCH: 35.9 pg — ABNORMAL HIGH (ref 26.0–34.0)
MCHC: 29.9 g/dL — ABNORMAL LOW (ref 30.0–36.0)
MCV: 120.1 fL — ABNORMAL HIGH (ref 78.0–100.0)
Monocytes Absolute: 2.2 10*3/uL — ABNORMAL HIGH (ref 0.1–1.0)
Monocytes Relative: 1 % — ABNORMAL LOW (ref 3–12)
Neutro Abs: 4.2 10*3/uL (ref 1.7–7.7)
Neutrophils Relative %: 2 % — ABNORMAL LOW (ref 43–77)
Platelets: 175 10*3/uL (ref 150–400)
RBC: 3.23 MIL/uL — ABNORMAL LOW (ref 4.22–5.81)
RDW: 19.2 % — ABNORMAL HIGH (ref 11.5–15.5)
WBC: 190.9 10*3/uL (ref 4.0–10.5)

## 2013-05-12 LAB — COMPREHENSIVE METABOLIC PANEL
ALT: 26 U/L (ref 0–53)
AST: 22 U/L (ref 0–37)
Albumin: 4.2 g/dL (ref 3.5–5.2)
Alkaline Phosphatase: 65 U/L (ref 39–117)
BUN: 12 mg/dL (ref 6–23)
CO2: 29 mEq/L (ref 19–32)
Calcium: 9.5 mg/dL (ref 8.4–10.5)
Chloride: 101 mEq/L (ref 96–112)
Creatinine, Ser: 0.69 mg/dL (ref 0.50–1.35)
GFR calc Af Amer: >90 mL/min (ref >90)
GFR calc non Af Amer: >90 mL/min (ref >90)
Glucose, Bld: 93 mg/dL (ref 70–99)
Potassium: 4.4 mEq/L (ref 3.7–5.3)
Sodium: 141 mEq/L (ref 137–147)
Total Bilirubin: 0.5 mg/dL (ref 0.3–1.2)
Total Protein: 7.7 g/dL (ref 6.0–8.3)

## 2013-05-12 LAB — URIC ACID: Uric Acid, Serum: 6.2 mg/dL (ref 4.0–7.8)

## 2013-05-12 LAB — LACTATE DEHYDROGENASE: LDH: 172 U/L (ref 94–250)

## 2013-05-12 NOTE — Progress Notes (Signed)
CRITICAL VALUE ALERT Critical value received:  WBC 190.9  Date of notification:  05/12/13 Time of notification: 1030 am Critical value read back:  yes Nurse who received alert:  T.Malene Blaydes,RN MD notified (1st page):  T.Kefalas, PA at 1030 am.  All labs faxed to [facility].

## 2013-05-12 NOTE — Progress Notes (Signed)
Labs drawn today for cbc/diff,cmp,ldh,uric acid 

## 2013-05-26 ENCOUNTER — Encounter (HOSPITAL_BASED_OUTPATIENT_CLINIC_OR_DEPARTMENT_OTHER): Payer: Medicaid Other

## 2013-05-26 ENCOUNTER — Encounter (HOSPITAL_COMMUNITY): Payer: Self-pay | Admitting: *Deleted

## 2013-05-26 DIAGNOSIS — D649 Anemia, unspecified: Secondary | ICD-10-CM

## 2013-05-26 DIAGNOSIS — C9110 Chronic lymphocytic leukemia of B-cell type not having achieved remission: Secondary | ICD-10-CM

## 2013-05-26 LAB — CBC WITH DIFFERENTIAL/PLATELET
Basophils Absolute: 0.3 10*3/uL — ABNORMAL HIGH (ref 0.0–0.1)
Basophils Relative: 0 % (ref 0–1)
Eosinophils Absolute: 0.1 10*3/uL (ref 0.0–0.7)
Eosinophils Relative: 0 % (ref 0–5)
HCT: 38.1 % — ABNORMAL LOW (ref 39.0–52.0)
Hemoglobin: 11.7 g/dL — ABNORMAL LOW (ref 13.0–17.0)
Lymphocytes Relative: 95 % — ABNORMAL HIGH (ref 12–46)
Lymphs Abs: 115.9 10*3/uL — ABNORMAL HIGH (ref 0.7–4.0)
MCH: 37 pg — ABNORMAL HIGH (ref 26.0–34.0)
MCHC: 30.7 g/dL (ref 30.0–36.0)
MCV: 120.6 fL — ABNORMAL HIGH (ref 78.0–100.0)
Monocytes Absolute: 1.4 10*3/uL — ABNORMAL HIGH (ref 0.1–1.0)
Monocytes Relative: 1 % — ABNORMAL LOW (ref 3–12)
Neutro Abs: 4 10*3/uL (ref 1.7–7.7)
Neutrophils Relative %: 3 % — ABNORMAL LOW (ref 43–77)
Platelets: 168 10*3/uL (ref 150–400)
RBC: 3.16 MIL/uL — ABNORMAL LOW (ref 4.22–5.81)
RDW: 17.6 % — ABNORMAL HIGH (ref 11.5–15.5)
WBC: 121.6 10*3/uL (ref 4.0–10.5)

## 2013-05-26 LAB — COMPREHENSIVE METABOLIC PANEL
ALT: 24 U/L (ref 0–53)
AST: 21 U/L (ref 0–37)
Albumin: 4.1 g/dL (ref 3.5–5.2)
Alkaline Phosphatase: 66 U/L (ref 39–117)
BUN: 13 mg/dL (ref 6–23)
CO2: 30 mEq/L (ref 19–32)
Calcium: 9.1 mg/dL (ref 8.4–10.5)
Chloride: 101 mEq/L (ref 96–112)
Creatinine, Ser: 0.66 mg/dL (ref 0.50–1.35)
GFR calc Af Amer: >90 mL/min (ref >90)
GFR calc non Af Amer: >90 mL/min (ref >90)
Glucose, Bld: 100 mg/dL — ABNORMAL HIGH (ref 70–99)
Potassium: 3.8 mEq/L (ref 3.7–5.3)
Sodium: 142 mEq/L (ref 137–147)
Total Bilirubin: 0.5 mg/dL (ref 0.3–1.2)
Total Protein: 7.5 g/dL (ref 6.0–8.3)

## 2013-05-26 LAB — LACTATE DEHYDROGENASE: LDH: 183 U/L (ref 94–250)

## 2013-05-26 LAB — URIC ACID: Uric Acid, Serum: 5.3 mg/dL (ref 4.0–7.8)

## 2013-05-26 NOTE — Progress Notes (Signed)
Critical WBC value of 121.6 received from Viola in lab at 1135.  Results faxed to [facility].

## 2013-05-26 NOTE — Progress Notes (Signed)
Labs drawn today for cbc/diff,cmp,ldh,uric acid 

## 2013-05-26 NOTE — Progress Notes (Signed)
Critical WBC value of 121.6 received from Rebecca in lab at 1135.  Results faxed to [facility].  

## 2013-06-08 ENCOUNTER — Telehealth (HOSPITAL_COMMUNITY): Payer: Self-pay

## 2013-06-08 ENCOUNTER — Encounter (HOSPITAL_COMMUNITY): Payer: Medicaid Other | Attending: Oncology

## 2013-06-08 DIAGNOSIS — D649 Anemia, unspecified: Secondary | ICD-10-CM | POA: Insufficient documentation

## 2013-06-08 DIAGNOSIS — C9110 Chronic lymphocytic leukemia of B-cell type not having achieved remission: Secondary | ICD-10-CM | POA: Insufficient documentation

## 2013-06-08 LAB — COMPREHENSIVE METABOLIC PANEL
ALT: 16 U/L (ref 0–53)
AST: 17 U/L (ref 0–37)
Albumin: 4.1 g/dL (ref 3.5–5.2)
Alkaline Phosphatase: 58 U/L (ref 39–117)
BUN: 13 mg/dL (ref 6–23)
CO2: 31 mEq/L (ref 19–32)
Calcium: 9.3 mg/dL (ref 8.4–10.5)
Chloride: 100 mEq/L (ref 96–112)
Creatinine, Ser: 0.68 mg/dL (ref 0.50–1.35)
GFR calc Af Amer: >90 mL/min (ref >90)
GFR calc non Af Amer: >90 mL/min (ref >90)
Glucose, Bld: 85 mg/dL (ref 70–99)
Potassium: 4 mEq/L (ref 3.7–5.3)
Sodium: 142 mEq/L (ref 137–147)
Total Bilirubin: 0.7 mg/dL (ref 0.3–1.2)
Total Protein: 7.2 g/dL (ref 6.0–8.3)

## 2013-06-08 LAB — CBC WITH DIFFERENTIAL/PLATELET
Basophils Absolute: 0.1 10*3/uL (ref 0.0–0.1)
Basophils Relative: 0 % (ref 0–1)
Eosinophils Absolute: 0.1 10*3/uL (ref 0.0–0.7)
Eosinophils Relative: 0 % (ref 0–5)
HCT: 38.6 % — ABNORMAL LOW (ref 39.0–52.0)
Hemoglobin: 12.5 g/dL — ABNORMAL LOW (ref 13.0–17.0)
Lymphocytes Relative: 96 % — ABNORMAL HIGH (ref 12–46)
Lymphs Abs: 82.8 10*3/uL — ABNORMAL HIGH (ref 0.7–4.0)
MCH: 38.2 pg — ABNORMAL HIGH (ref 26.0–34.0)
MCHC: 32.4 g/dL (ref 30.0–36.0)
MCV: 118 fL — ABNORMAL HIGH (ref 78.0–100.0)
Monocytes Absolute: 1 10*3/uL (ref 0.1–1.0)
Monocytes Relative: 1 % — ABNORMAL LOW (ref 3–12)
Neutro Abs: 2.3 10*3/uL (ref 1.7–7.7)
Neutrophils Relative %: 3 % — ABNORMAL LOW (ref 43–77)
Platelets: 178 10*3/uL (ref 150–400)
RBC: 3.27 MIL/uL — ABNORMAL LOW (ref 4.22–5.81)
RDW: 15.5 % (ref 11.5–15.5)
WBC: 86.3 10*3/uL (ref 4.0–10.5)

## 2013-06-08 LAB — LACTATE DEHYDROGENASE: LDH: 200 U/L (ref 94–250)

## 2013-06-08 LAB — URIC ACID: Uric Acid, Serum: 5.8 mg/dL (ref 4.0–7.8)

## 2013-06-08 NOTE — Telephone Encounter (Signed)
CRITICAL VALUE ALERT Critical value received:  WBC 86.2 Date of notification:  06/08/13  Time of notification: 11:10 Critical value read back:  yes Nurse who received alert:  Mickie Kay, RN Robynn Pane, PA- C notified @ 11:30am and results will be faxed to [facility]

## 2013-06-08 NOTE — Progress Notes (Signed)
Labs drawn today for cbc/diff,cmp,ldh,uric acid

## 2013-06-09 ENCOUNTER — Other Ambulatory Visit (HOSPITAL_COMMUNITY): Payer: Medicaid Other

## 2013-06-23 ENCOUNTER — Encounter (HOSPITAL_BASED_OUTPATIENT_CLINIC_OR_DEPARTMENT_OTHER): Payer: Medicaid Other

## 2013-06-23 DIAGNOSIS — C9110 Chronic lymphocytic leukemia of B-cell type not having achieved remission: Secondary | ICD-10-CM

## 2013-06-23 DIAGNOSIS — D649 Anemia, unspecified: Secondary | ICD-10-CM

## 2013-06-23 LAB — CBC WITH DIFFERENTIAL/PLATELET
Basophils Absolute: 0.1 10*3/uL (ref 0.0–0.1)
Basophils Relative: 0 % (ref 0–1)
Eosinophils Absolute: 0.1 10*3/uL (ref 0.0–0.7)
Eosinophils Relative: 0 % (ref 0–5)
HCT: 40 % (ref 39.0–52.0)
Hemoglobin: 13 g/dL (ref 13.0–17.0)
Lymphocytes Relative: 95 % — ABNORMAL HIGH (ref 12–46)
Lymphs Abs: 62.2 10*3/uL — ABNORMAL HIGH (ref 0.7–4.0)
MCH: 38.1 pg — ABNORMAL HIGH (ref 26.0–34.0)
MCHC: 32.5 g/dL (ref 30.0–36.0)
MCV: 117.3 fL — ABNORMAL HIGH (ref 78.0–100.0)
Monocytes Absolute: 0.9 10*3/uL (ref 0.1–1.0)
Monocytes Relative: 1 % — ABNORMAL LOW (ref 3–12)
Neutro Abs: 2 10*3/uL (ref 1.7–7.7)
Neutrophils Relative %: 3 % — ABNORMAL LOW (ref 43–77)
Platelets: 164 10*3/uL (ref 150–400)
RBC: 3.41 MIL/uL — ABNORMAL LOW (ref 4.22–5.81)
RDW: 14.3 % (ref 11.5–15.5)
WBC: 65.4 10*3/uL (ref 4.0–10.5)

## 2013-06-23 LAB — LACTATE DEHYDROGENASE: LDH: 173 U/L (ref 94–250)

## 2013-06-23 LAB — COMPREHENSIVE METABOLIC PANEL
ALT: 16 U/L (ref 0–53)
AST: 16 U/L (ref 0–37)
Albumin: 4 g/dL (ref 3.5–5.2)
Alkaline Phosphatase: 57 U/L (ref 39–117)
BUN: 24 mg/dL — ABNORMAL HIGH (ref 6–23)
CO2: 29 mEq/L (ref 19–32)
Calcium: 9.3 mg/dL (ref 8.4–10.5)
Chloride: 103 mEq/L (ref 96–112)
Creatinine, Ser: 0.65 mg/dL (ref 0.50–1.35)
GFR calc Af Amer: >90 mL/min (ref >90)
GFR calc non Af Amer: >90 mL/min (ref >90)
Glucose, Bld: 99 mg/dL (ref 70–99)
Potassium: 4 mEq/L (ref 3.7–5.3)
Sodium: 143 mEq/L (ref 137–147)
Total Bilirubin: 0.6 mg/dL (ref 0.3–1.2)
Total Protein: 7.3 g/dL (ref 6.0–8.3)

## 2013-06-23 LAB — URIC ACID: Uric Acid, Serum: 6 mg/dL (ref 4.0–7.8)

## 2013-06-23 NOTE — Progress Notes (Signed)
CRITICAL VALUE ALERT Critical value received:  Wbc 65.4 Date of notification:  06/23/2013 Time of notification: 11:40 Critical value read back:  yes Nurse who received alert:  tyoder MD notified (1st page):  Results faxed to [facility]. The patient is being treated by that practice

## 2013-06-23 NOTE — Progress Notes (Signed)
Labs drawn today for cbc/diff,cmp,ldh,uric acid

## 2013-06-25 ENCOUNTER — Other Ambulatory Visit (HOSPITAL_COMMUNITY): Payer: Self-pay

## 2013-06-25 DIAGNOSIS — D649 Anemia, unspecified: Secondary | ICD-10-CM

## 2013-06-25 DIAGNOSIS — C9110 Chronic lymphocytic leukemia of B-cell type not having achieved remission: Secondary | ICD-10-CM

## 2013-06-30 ENCOUNTER — Encounter (HOSPITAL_COMMUNITY): Payer: Medicaid Other | Attending: Oncology

## 2013-06-30 DIAGNOSIS — C9110 Chronic lymphocytic leukemia of B-cell type not having achieved remission: Secondary | ICD-10-CM | POA: Insufficient documentation

## 2013-06-30 DIAGNOSIS — D649 Anemia, unspecified: Secondary | ICD-10-CM | POA: Insufficient documentation

## 2013-06-30 LAB — COMPREHENSIVE METABOLIC PANEL
ALT: 20 U/L (ref 0–53)
AST: 19 U/L (ref 0–37)
Albumin: 4.4 g/dL (ref 3.5–5.2)
Alkaline Phosphatase: 64 U/L (ref 39–117)
BUN: 21 mg/dL (ref 6–23)
CO2: 29 mEq/L (ref 19–32)
Calcium: 9.5 mg/dL (ref 8.4–10.5)
Chloride: 100 mEq/L (ref 96–112)
Creatinine, Ser: 0.69 mg/dL (ref 0.50–1.35)
GFR calc Af Amer: >90 mL/min (ref >90)
GFR calc non Af Amer: >90 mL/min (ref >90)
Glucose, Bld: 114 mg/dL — ABNORMAL HIGH (ref 70–99)
Potassium: 4.1 mEq/L (ref 3.7–5.3)
Sodium: 141 mEq/L (ref 137–147)
Total Bilirubin: 0.8 mg/dL (ref 0.3–1.2)
Total Protein: 7.8 g/dL (ref 6.0–8.3)

## 2013-06-30 LAB — CBC WITH DIFFERENTIAL/PLATELET
Basophils Absolute: 0.1 10*3/uL (ref 0.0–0.1)
Basophils Relative: 0 % (ref 0–1)
Eosinophils Absolute: 0.1 10*3/uL (ref 0.0–0.7)
Eosinophils Relative: 0 % (ref 0–5)
HCT: 39.8 % (ref 39.0–52.0)
Hemoglobin: 13.1 g/dL (ref 13.0–17.0)
Lymphocytes Relative: 94 % — ABNORMAL HIGH (ref 12–46)
Lymphs Abs: 53.6 10*3/uL — ABNORMAL HIGH (ref 0.7–4.0)
MCH: 37.8 pg — ABNORMAL HIGH (ref 26.0–34.0)
MCHC: 32.9 g/dL (ref 30.0–36.0)
MCV: 114.7 fL — ABNORMAL HIGH (ref 78.0–100.0)
Monocytes Absolute: 0.9 10*3/uL (ref 0.1–1.0)
Monocytes Relative: 2 % — ABNORMAL LOW (ref 3–12)
Neutro Abs: 2.1 10*3/uL (ref 1.7–7.7)
Neutrophils Relative %: 4 % — ABNORMAL LOW (ref 43–77)
Platelets: 168 10*3/uL (ref 150–400)
RBC: 3.47 MIL/uL — ABNORMAL LOW (ref 4.22–5.81)
RDW: 13.7 % (ref 11.5–15.5)
WBC: 57.4 10*3/uL (ref 4.0–10.5)

## 2013-06-30 LAB — PHOSPHORUS: Phosphorus: 4.1 mg/dL (ref 2.3–4.6)

## 2013-06-30 LAB — LACTATE DEHYDROGENASE: LDH: 218 U/L (ref 94–250)

## 2013-06-30 LAB — MAGNESIUM: Magnesium: 2.1 mg/dL (ref 1.5–2.5)

## 2013-06-30 NOTE — Progress Notes (Signed)
CRITICAL VALUE ALERT  Critical value received:  WBC 57.4  Date of notification:  06/30/13  Time of notification:  6578  Critical value read back:yes  Nurse who received alert:  Shellia Carwin RN  MD notified (1st page):  Caroleen Hamman PA  Time of first page:  1238  MD notified (2nd page):  Time of second page:  Responding MD:   Time MD responded:  Labs faxed to [facility]

## 2013-07-14 ENCOUNTER — Encounter (HOSPITAL_BASED_OUTPATIENT_CLINIC_OR_DEPARTMENT_OTHER): Payer: Medicaid Other

## 2013-07-14 DIAGNOSIS — C9110 Chronic lymphocytic leukemia of B-cell type not having achieved remission: Secondary | ICD-10-CM

## 2013-07-14 DIAGNOSIS — D649 Anemia, unspecified: Secondary | ICD-10-CM

## 2013-07-14 LAB — CBC WITH DIFFERENTIAL/PLATELET
Basophils Absolute: 0.1 10*3/uL (ref 0.0–0.1)
Basophils Relative: 0 % (ref 0–1)
Eosinophils Absolute: 0 10*3/uL (ref 0.0–0.7)
Eosinophils Relative: 0 % (ref 0–5)
HCT: 40.9 % (ref 39.0–52.0)
Hemoglobin: 13.4 g/dL (ref 13.0–17.0)
Lymphocytes Relative: 90 % — ABNORMAL HIGH (ref 12–46)
Lymphs Abs: 37.2 10*3/uL — ABNORMAL HIGH (ref 0.7–4.0)
MCH: 37.2 pg — ABNORMAL HIGH (ref 26.0–34.0)
MCHC: 32.8 g/dL (ref 30.0–36.0)
MCV: 113.6 fL — ABNORMAL HIGH (ref 78.0–100.0)
Monocytes Absolute: 1 10*3/uL (ref 0.1–1.0)
Monocytes Relative: 3 % (ref 3–12)
Neutro Abs: 3 10*3/uL (ref 1.7–7.7)
Neutrophils Relative %: 7 % — ABNORMAL LOW (ref 43–77)
Platelets: 172 10*3/uL (ref 150–400)
RBC: 3.6 MIL/uL — ABNORMAL LOW (ref 4.22–5.81)
RDW: 13.6 % (ref 11.5–15.5)
WBC: 41.3 10*3/uL — ABNORMAL HIGH (ref 4.0–10.5)

## 2013-07-14 LAB — COMPREHENSIVE METABOLIC PANEL
ALT: 20 U/L (ref 0–53)
AST: 22 U/L (ref 0–37)
Albumin: 4.1 g/dL (ref 3.5–5.2)
Alkaline Phosphatase: 63 U/L (ref 39–117)
BUN: 11 mg/dL (ref 6–23)
CO2: 27 mEq/L (ref 19–32)
Calcium: 9.3 mg/dL (ref 8.4–10.5)
Chloride: 101 mEq/L (ref 96–112)
Creatinine, Ser: 0.68 mg/dL (ref 0.50–1.35)
GFR calc Af Amer: >90 mL/min (ref >90)
GFR calc non Af Amer: >90 mL/min (ref >90)
Glucose, Bld: 170 mg/dL — ABNORMAL HIGH (ref 70–99)
Potassium: 4 mEq/L (ref 3.7–5.3)
Sodium: 141 mEq/L (ref 137–147)
Total Bilirubin: 0.6 mg/dL (ref 0.3–1.2)
Total Protein: 7.5 g/dL (ref 6.0–8.3)

## 2013-07-14 LAB — LACTATE DEHYDROGENASE: LDH: 219 U/L (ref 94–250)

## 2013-07-14 LAB — MAGNESIUM: Magnesium: 1.9 mg/dL (ref 1.5–2.5)

## 2013-07-14 LAB — PHOSPHORUS: Phosphorus: 4.1 mg/dL (ref 2.3–4.6)

## 2013-07-14 NOTE — Progress Notes (Signed)
Labs drawn today for cbc/diff,cmp,mg,ldh,phos

## 2013-07-28 ENCOUNTER — Other Ambulatory Visit (HOSPITAL_COMMUNITY): Payer: Medicaid Other

## 2013-08-11 ENCOUNTER — Other Ambulatory Visit (HOSPITAL_COMMUNITY): Payer: Medicaid Other

## 2013-08-31 ENCOUNTER — Other Ambulatory Visit (HOSPITAL_COMMUNITY): Payer: Self-pay

## 2013-08-31 DIAGNOSIS — C9110 Chronic lymphocytic leukemia of B-cell type not having achieved remission: Secondary | ICD-10-CM

## 2013-09-02 ENCOUNTER — Encounter (HOSPITAL_COMMUNITY): Payer: Medicaid Other | Attending: Oncology

## 2013-09-02 DIAGNOSIS — C9110 Chronic lymphocytic leukemia of B-cell type not having achieved remission: Secondary | ICD-10-CM | POA: Insufficient documentation

## 2013-09-02 LAB — COMPREHENSIVE METABOLIC PANEL
ALT: 13 U/L (ref 0–53)
AST: 16 U/L (ref 0–37)
Albumin: 4 g/dL (ref 3.5–5.2)
Alkaline Phosphatase: 62 U/L (ref 39–117)
BUN: 10 mg/dL (ref 6–23)
CO2: 26 mEq/L (ref 19–32)
Calcium: 9.1 mg/dL (ref 8.4–10.5)
Chloride: 105 mEq/L (ref 96–112)
Creatinine, Ser: 0.64 mg/dL (ref 0.50–1.35)
GFR calc Af Amer: >90 mL/min (ref >90)
GFR calc non Af Amer: >90 mL/min (ref >90)
Glucose, Bld: 103 mg/dL — ABNORMAL HIGH (ref 70–99)
Potassium: 3.7 mEq/L (ref 3.7–5.3)
Sodium: 144 mEq/L (ref 137–147)
Total Bilirubin: 0.5 mg/dL (ref 0.3–1.2)
Total Protein: 7.2 g/dL (ref 6.0–8.3)

## 2013-09-02 LAB — CBC WITH DIFFERENTIAL/PLATELET
Basophils Absolute: 0 10*3/uL (ref 0.0–0.1)
Basophils Relative: 0 % (ref 0–1)
Eosinophils Absolute: 0.1 10*3/uL (ref 0.0–0.7)
Eosinophils Relative: 1 % (ref 0–5)
HCT: 40.5 % (ref 39.0–52.0)
Hemoglobin: 13.5 g/dL (ref 13.0–17.0)
Lymphocytes Relative: 77 % — ABNORMAL HIGH (ref 12–46)
Lymphs Abs: 16.9 10*3/uL — ABNORMAL HIGH (ref 0.7–4.0)
MCH: 34 pg (ref 26.0–34.0)
MCHC: 33.3 g/dL (ref 30.0–36.0)
MCV: 102 fL — ABNORMAL HIGH (ref 78.0–100.0)
Monocytes Absolute: 0.5 10*3/uL (ref 0.1–1.0)
Monocytes Relative: 3 % (ref 3–12)
Neutro Abs: 4.3 10*3/uL (ref 1.7–7.7)
Neutrophils Relative %: 20 % — ABNORMAL LOW (ref 43–77)
Platelets: 176 10*3/uL (ref 150–400)
RBC: 3.97 MIL/uL — ABNORMAL LOW (ref 4.22–5.81)
RDW: 13 % (ref 11.5–15.5)
WBC: 21.9 10*3/uL — ABNORMAL HIGH (ref 4.0–10.5)

## 2013-09-02 LAB — LACTATE DEHYDROGENASE: LDH: 178 U/L (ref 94–250)

## 2013-09-02 LAB — MAGNESIUM: Magnesium: 1.9 mg/dL (ref 1.5–2.5)

## 2013-09-02 LAB — URIC ACID: Uric Acid, Serum: 5.5 mg/dL (ref 4.0–7.8)

## 2013-09-02 LAB — PHOSPHORUS: Phosphorus: 3.5 mg/dL (ref 2.3–4.6)

## 2013-09-02 NOTE — Progress Notes (Signed)
Labs drawn for cbc/diff, cmp, magnesium, phos, uric acid, ldh.

## 2013-09-03 ENCOUNTER — Other Ambulatory Visit (HOSPITAL_COMMUNITY): Payer: Medicaid Other

## 2013-09-08 ENCOUNTER — Encounter (HOSPITAL_BASED_OUTPATIENT_CLINIC_OR_DEPARTMENT_OTHER): Payer: Medicaid Other

## 2013-09-08 DIAGNOSIS — C9110 Chronic lymphocytic leukemia of B-cell type not having achieved remission: Secondary | ICD-10-CM

## 2013-09-08 LAB — CBC WITH DIFFERENTIAL/PLATELET
Basophils Absolute: 0 10*3/uL (ref 0.0–0.1)
Basophils Relative: 0 % (ref 0–1)
Eosinophils Absolute: 0.2 10*3/uL (ref 0.0–0.7)
Eosinophils Relative: 1 % (ref 0–5)
HCT: 42.2 % (ref 39.0–52.0)
Hemoglobin: 14 g/dL (ref 13.0–17.0)
Lymphocytes Relative: 76 % — ABNORMAL HIGH (ref 12–46)
Lymphs Abs: 21.5 10*3/uL — ABNORMAL HIGH (ref 0.7–4.0)
MCH: 33.5 pg (ref 26.0–34.0)
MCHC: 33.2 g/dL (ref 30.0–36.0)
MCV: 101 fL — ABNORMAL HIGH (ref 78.0–100.0)
Monocytes Absolute: 0.9 10*3/uL (ref 0.1–1.0)
Monocytes Relative: 3 % (ref 3–12)
Neutro Abs: 5.7 10*3/uL (ref 1.7–7.7)
Neutrophils Relative %: 20 % — ABNORMAL LOW (ref 43–77)
Platelets: 203 10*3/uL (ref 150–400)
RBC: 4.18 MIL/uL — ABNORMAL LOW (ref 4.22–5.81)
RDW: 13.3 % (ref 11.5–15.5)
WBC: 28.2 10*3/uL — ABNORMAL HIGH (ref 4.0–10.5)

## 2013-09-08 LAB — PHOSPHORUS: Phosphorus: 3.9 mg/dL (ref 2.3–4.6)

## 2013-09-08 LAB — COMPREHENSIVE METABOLIC PANEL
ALT: 12 U/L (ref 0–53)
AST: 14 U/L (ref 0–37)
Albumin: 4.1 g/dL (ref 3.5–5.2)
Alkaline Phosphatase: 78 U/L (ref 39–117)
BUN: 19 mg/dL (ref 6–23)
CO2: 27 mEq/L (ref 19–32)
Calcium: 9.4 mg/dL (ref 8.4–10.5)
Chloride: 98 mEq/L (ref 96–112)
Creatinine, Ser: 0.65 mg/dL (ref 0.50–1.35)
GFR calc Af Amer: >90 mL/min (ref >90)
GFR calc non Af Amer: >90 mL/min (ref >90)
Glucose, Bld: 115 mg/dL — ABNORMAL HIGH (ref 70–99)
Potassium: 4.4 mEq/L (ref 3.7–5.3)
Sodium: 138 mEq/L (ref 137–147)
Total Bilirubin: 0.6 mg/dL (ref 0.3–1.2)
Total Protein: 7.4 g/dL (ref 6.0–8.3)

## 2013-09-08 LAB — MAGNESIUM: Magnesium: 2.1 mg/dL (ref 1.5–2.5)

## 2013-09-08 LAB — URIC ACID: Uric Acid, Serum: 5.6 mg/dL (ref 4.0–7.8)

## 2013-09-08 LAB — LACTATE DEHYDROGENASE: LDH: 177 U/L (ref 94–250)

## 2013-09-08 MED ORDER — HEPARIN SOD (PORK) LOCK FLUSH 100 UNIT/ML IV SOLN
500.0000 [IU] | Freq: Once | INTRAVENOUS | Status: AC
  Administered 2013-09-08: 500 [IU] via INTRAVENOUS
  Filled 2013-09-08: qty 5

## 2013-09-08 MED ORDER — SODIUM CHLORIDE 0.9 % IJ SOLN
10.0000 mL | INTRAMUSCULAR | Status: DC | PRN
  Administered 2013-09-08: 10 mL via INTRAVENOUS

## 2013-09-08 NOTE — Progress Notes (Signed)
Tim Duncan presented for Portacath access and flush.   Portacath located right chest wall accessed with  H 20 needle.  Good blood return present. Portacath flushed with 81ml NS and 500U/58ml Heparin and needle removed intact.  Procedure tolerated well and without incident.   Labs sent to lab.

## 2013-10-04 ENCOUNTER — Encounter (HOSPITAL_COMMUNITY): Payer: Medicaid Other | Attending: Oncology

## 2013-10-04 DIAGNOSIS — C9110 Chronic lymphocytic leukemia of B-cell type not having achieved remission: Secondary | ICD-10-CM | POA: Diagnosis not present

## 2013-10-04 LAB — CBC WITH DIFFERENTIAL/PLATELET
Basophils Absolute: 0 10*3/uL (ref 0.0–0.1)
Basophils Relative: 0 % (ref 0–1)
Eosinophils Absolute: 0.3 10*3/uL (ref 0.0–0.7)
Eosinophils Relative: 1 % (ref 0–5)
HCT: 43.1 % (ref 39.0–52.0)
Hemoglobin: 14.1 g/dL (ref 13.0–17.0)
Lymphocytes Relative: 73 % — ABNORMAL HIGH (ref 12–46)
Lymphs Abs: 18.3 10*3/uL — ABNORMAL HIGH (ref 0.7–4.0)
MCH: 31.5 pg (ref 26.0–34.0)
MCHC: 32.7 g/dL (ref 30.0–36.0)
MCV: 96.4 fL (ref 78.0–100.0)
Monocytes Absolute: 1.1 10*3/uL — ABNORMAL HIGH (ref 0.1–1.0)
Monocytes Relative: 4 % (ref 3–12)
Neutro Abs: 5.3 10*3/uL (ref 1.7–7.7)
Neutrophils Relative %: 21 % — ABNORMAL LOW (ref 43–77)
Platelets: 167 10*3/uL (ref 150–400)
RBC: 4.47 MIL/uL (ref 4.22–5.81)
RDW: 13.5 % (ref 11.5–15.5)
WBC: 25 10*3/uL — ABNORMAL HIGH (ref 4.0–10.5)

## 2013-10-04 LAB — COMPREHENSIVE METABOLIC PANEL
ALT: 17 U/L (ref 0–53)
AST: 14 U/L (ref 0–37)
Albumin: 4.3 g/dL (ref 3.5–5.2)
Alkaline Phosphatase: 73 U/L (ref 39–117)
Anion gap: 14 (ref 5–15)
BUN: 20 mg/dL (ref 6–23)
CO2: 24 mEq/L (ref 19–32)
Calcium: 9.4 mg/dL (ref 8.4–10.5)
Chloride: 102 mEq/L (ref 96–112)
Creatinine, Ser: 0.71 mg/dL (ref 0.50–1.35)
GFR calc Af Amer: >90 mL/min (ref >90)
GFR calc non Af Amer: >90 mL/min (ref >90)
Glucose, Bld: 103 mg/dL — ABNORMAL HIGH (ref 70–99)
Potassium: 4.1 mEq/L (ref 3.7–5.3)
Sodium: 140 mEq/L (ref 137–147)
Total Bilirubin: 0.8 mg/dL (ref 0.3–1.2)
Total Protein: 7.5 g/dL (ref 6.0–8.3)

## 2013-10-04 LAB — PHOSPHORUS: Phosphorus: 3.6 mg/dL (ref 2.3–4.6)

## 2013-10-04 LAB — MAGNESIUM: Magnesium: 2 mg/dL (ref 1.5–2.5)

## 2013-10-04 LAB — URIC ACID: Uric Acid, Serum: 5.1 mg/dL (ref 4.0–7.8)

## 2013-10-04 LAB — LACTATE DEHYDROGENASE: LDH: 185 U/L (ref 94–250)

## 2013-10-04 NOTE — Progress Notes (Signed)
LABS DRAWN FOR CBCD, CMP,PHOS,MG,LDH, URIC

## 2013-12-08 ENCOUNTER — Encounter (HOSPITAL_COMMUNITY): Payer: Medicaid Other | Attending: Oncology

## 2013-12-08 ENCOUNTER — Emergency Department (HOSPITAL_COMMUNITY)
Admission: EM | Admit: 2013-12-08 | Discharge: 2013-12-08 | Disposition: A | Discharge status: Home / Self Care | Payer: Medicaid Other | Attending: Emergency Medicine | Admitting: Emergency Medicine

## 2013-12-08 ENCOUNTER — Encounter (HOSPITAL_COMMUNITY): Payer: Self-pay | Admitting: Emergency Medicine

## 2013-12-08 ENCOUNTER — Emergency Department (HOSPITAL_COMMUNITY): Payer: Medicaid Other

## 2013-12-08 DIAGNOSIS — Z862 Personal history of diseases of the blood and blood-forming organs and certain disorders involving the immune mechanism: Secondary | ICD-10-CM | POA: Diagnosis not present

## 2013-12-08 DIAGNOSIS — Z8639 Personal history of other endocrine, nutritional and metabolic disease: Secondary | ICD-10-CM | POA: Diagnosis not present

## 2013-12-08 DIAGNOSIS — R21 Rash and other nonspecific skin eruption: Secondary | ICD-10-CM | POA: Insufficient documentation

## 2013-12-08 DIAGNOSIS — Z87891 Personal history of nicotine dependence: Secondary | ICD-10-CM | POA: Insufficient documentation

## 2013-12-08 DIAGNOSIS — Z79899 Other long term (current) drug therapy: Secondary | ICD-10-CM | POA: Diagnosis not present

## 2013-12-08 DIAGNOSIS — C9110 Chronic lymphocytic leukemia of B-cell type not having achieved remission: Secondary | ICD-10-CM

## 2013-12-08 DIAGNOSIS — M7989 Other specified soft tissue disorders: Secondary | ICD-10-CM | POA: Diagnosis not present

## 2013-12-08 DIAGNOSIS — Z856 Personal history of leukemia: Secondary | ICD-10-CM | POA: Diagnosis not present

## 2013-12-08 LAB — LACTATE DEHYDROGENASE: LDH: 199 U/L (ref 94–250)

## 2013-12-08 LAB — CBC WITH DIFFERENTIAL/PLATELET
Basophils Absolute: 0 10*3/uL (ref 0.0–0.1)
Basophils Relative: 0 % (ref 0–1)
Eosinophils Absolute: 0.3 10*3/uL (ref 0.0–0.7)
Eosinophils Relative: 2 % (ref 0–5)
HCT: 40.5 % (ref 39.0–52.0)
Hemoglobin: 13.4 g/dL (ref 13.0–17.0)
Lymphocytes Relative: 68 % — ABNORMAL HIGH (ref 12–46)
Lymphs Abs: 10.7 10*3/uL — ABNORMAL HIGH (ref 0.7–4.0)
MCH: 29.8 pg (ref 26.0–34.0)
MCHC: 33.1 g/dL (ref 30.0–36.0)
MCV: 90.2 fL (ref 78.0–100.0)
Monocytes Absolute: 1.1 10*3/uL — ABNORMAL HIGH (ref 0.1–1.0)
Monocytes Relative: 7 % (ref 3–12)
Neutro Abs: 3.7 10*3/uL (ref 1.7–7.7)
Neutrophils Relative %: 24 % — ABNORMAL LOW (ref 43–77)
Platelets: 238 10*3/uL (ref 150–400)
RBC: 4.49 MIL/uL (ref 4.22–5.81)
RDW: 13.6 % (ref 11.5–15.5)
WBC: 15.8 10*3/uL — ABNORMAL HIGH (ref 4.0–10.5)

## 2013-12-08 LAB — COMPREHENSIVE METABOLIC PANEL
ALT: 12 U/L (ref 0–53)
AST: 12 U/L (ref 0–37)
Albumin: 4 g/dL (ref 3.5–5.2)
Alkaline Phosphatase: 86 U/L (ref 39–117)
Anion gap: 14 (ref 5–15)
BUN: 12 mg/dL (ref 6–23)
CO2: 25 mEq/L (ref 19–32)
Calcium: 9.3 mg/dL (ref 8.4–10.5)
Chloride: 100 mEq/L (ref 96–112)
Creatinine, Ser: 0.74 mg/dL (ref 0.50–1.35)
GFR calc Af Amer: >90 mL/min (ref >90)
GFR calc non Af Amer: >90 mL/min (ref >90)
Glucose, Bld: 106 mg/dL — ABNORMAL HIGH (ref 70–99)
Potassium: 4.3 mEq/L (ref 3.7–5.3)
Sodium: 139 mEq/L (ref 137–147)
Total Bilirubin: 0.6 mg/dL (ref 0.3–1.2)
Total Protein: 7.5 g/dL (ref 6.0–8.3)

## 2013-12-08 LAB — MAGNESIUM: Magnesium: 2 mg/dL (ref 1.5–2.5)

## 2013-12-08 LAB — URIC ACID: Uric Acid, Serum: 6 mg/dL (ref 4.0–7.8)

## 2013-12-08 LAB — PHOSPHORUS: Phosphorus: 3.6 mg/dL (ref 2.3–4.6)

## 2013-12-08 MED ORDER — NAPROXEN 500 MG PO TABS: 500.0000 mg | ORAL_TABLET | Freq: Two times a day (BID) | ORAL | Status: DC

## 2013-12-08 MED ORDER — SODIUM CHLORIDE 0.9 % IJ SOLN
10.0000 mL | INTRAMUSCULAR | Status: DC | PRN
  Administered 2013-12-08: 10 mL via INTRAVENOUS

## 2013-12-08 MED ORDER — HEPARIN SOD (PORK) LOCK FLUSH 100 UNIT/ML IV SOLN
INTRAVENOUS | Status: AC
  Filled 2013-12-08: qty 10

## 2013-12-08 MED ORDER — VANCOMYCIN HCL IN DEXTROSE 1-5 GM/200ML-% IV SOLN
1000.0000 mg | Freq: Once | INTRAVENOUS | Status: AC
  Administered 2013-12-08: 1000 mg via INTRAVENOUS
  Filled 2013-12-08: qty 200

## 2013-12-08 MED ORDER — HEPARIN SOD (PORK) LOCK FLUSH 100 UNIT/ML IV SOLN
500.0000 [IU] | Freq: Once | INTRAVENOUS | Status: AC
  Administered 2013-12-08: 500 [IU] via INTRAVENOUS

## 2013-12-08 MED ORDER — HYDROCODONE-ACETAMINOPHEN 5-325 MG PO TABS: 2.0000 | ORAL_TABLET | ORAL | Status: DC | PRN

## 2013-12-08 MED ORDER — SULFAMETHOXAZOLE-TRIMETHOPRIM 800-160 MG PO TABS: 1.0000 | ORAL_TABLET | Freq: Two times a day (BID) | ORAL | Status: DC

## 2013-12-08 NOTE — Discharge Instructions (Signed)
Please call your doctor for a followup appointment within 24-48 hours. When you talk to your doctor please let them know that you were seen in the emergency department and have them acquire all of your records so that they can discuss the findings with you and formulate a treatment plan to fully care for your new and ongoing problems. ° °

## 2013-12-08 NOTE — ED Notes (Signed)
Call [facility] to [place] and she said to call [facility] to take Pt home. They said they would.  Nurse informed.

## 2013-12-08 NOTE — Progress Notes (Signed)
Tim Duncan presented for Portacath access and flush. .  Portacath located right (double)chest wall accessed with  H 20 needle.  Good blood return present. Portacath flushed with 70ml NS and 500U/27ml Heparin and needle removed intact.  Procedure tolerated well and without incident.   Labs sent to lab per previous orders.

## 2013-12-08 NOTE — ED Provider Notes (Signed)
CSN: [id number]     Arrival date & time 12/08/13  1101 History   First MD Initiated Contact with Patient 12/08/13 1329     Chief Complaint  Patient presents with  . mutliple skin lesions    . finger swelling      (Consider location/radiation/quality/duration/timing/severity/associated sxs/prior Treatment) HPI Comments: Tim Duncan is a pleasant 63 y/o male with hx of CLL, Anemia and recent dx of genital herpes who presents with c/o rash to his bilateral arms and to the R 4th digit.  This started in the last month but then resolved and then came on again in the last week and was associated with pain in the R ring finger with severe swelling, redness and pain with ROM.  He has had prior procedure for orthopedic surgery and states a pin was left in the finger many years ago.  He has no fevers and no penile d/c, no hx of arthritis and no hx of psoriasis.  He has no itching to these lesions and no lesions to the trunk, the face, the back or the legs.  He was seen at the cancer clinics this morning and had blood work done including CMP, CBC, uric acid, LDH, phosp and mag all of which were essentially normal - he has a leukocytosis chronically and today was better than normal.  The history is provided by the patient.    Past Medical History  Diagnosis Date  . Anemia 08/26/2012  . Cancer     cll  . CLL (chronic lymphocytic leukemia)   . Lymphadenopathy   . Lymphocytosis   . Thrombocytosis    Past Surgical History  Procedure Laterality Date  . Hernia repair    . Fracture surgery    . Portacath placement     No family history on file. History  Substance Use Topics  . Smoking status: Former Research scientist (life sciences)  . Smokeless tobacco: Not on file  . Alcohol Use: No    Review of Systems  All other systems reviewed and are negative.     Allergies  Review of patient's allergies indicates no known allergies.  Home Medications   Prior to Admission medications   Medication Sig Start Date End Date  Taking? Authorizing Provider  ibrutinib (IMBRUVICA) 140 MG capsul Take 420 mg by mouth daily. 10/27/13 01/25/14 Yes Historical Provider, MD  valACYclovir (VALTREX) 500 MG tablet Take 500 mg by mouth 2 (two) times daily. 10/27/13 10/27/14 Yes Historical Provider, MD  HYDROcodone-acetaminophen (NORCO/VICODIN) 5-325 MG per tablet Take 2 tablets by mouth every 4 (four) hours as needed. 12/08/13   Johnna Acosta, MD  naproxen (NAPROSYN) 500 MG tablet Take 1 tablet (500 mg total) by mouth 2 (two) times daily with a meal. 12/08/13   Johnna Acosta, MD  sulfamethoxazole-trimethoprim (SEPTRA DS) 800-160 MG per tablet Take 1 tablet by mouth every 12 (twelve) hours. 12/08/13   Johnna Acosta, MD   BP 139/76  Pulse 81  Temp(Src) 98.6 F (37 C) (Oral)  Resp 26  Ht 5\' 11"  (1.803 m)  Wt 278 lb (126.1 kg)  BMI 38.79 kg/m2  SpO2 100% Physical Exam  Nursing note and vitals reviewed. Constitutional: He appears well-developed and well-nourished. No distress.  HENT:  Head: Normocephalic and atraumatic.  Mouth/Throat: Oropharynx is clear and moist. No oropharyngeal exudate.  Eyes: Conjunctivae and EOM are normal. Pupils are equal, round, and reactive to light. Right eye exhibits no discharge. Left eye exhibits no discharge. No scleral icterus.  Neck: Normal range  of motion. Neck supple. No JVD present. No thyromegaly present.  Cardiovascular: Normal rate, regular rhythm, normal heart sounds and intact distal pulses.  Exam reveals no gallop and no friction rub.   No murmur heard. Pulmonary/Chest: Effort normal and breath sounds normal. No respiratory distress. He has no wheezes. He has no rales.  Abdominal: Soft. Bowel sounds are normal. He exhibits no distension and no mass. There is no tenderness.  Musculoskeletal: Normal range of motion. He exhibits tenderness ( of the R 4th finger - limited extension and flexion 2/2 pain). He exhibits no edema.  Lymphadenopathy:    He has no cervical adenopathy.  Neurological: He  is alert. Coordination normal.  Skin: Skin is warm and dry. Rash noted. There is erythema.  UE's are bilaterally covered in a smattering of erythematous, open sores with some surrounding erythema, occasional, thin roofed tender nodules and his R finger (4th) has a very swollen and erythematous proximal phalanx with some swelling extending onto the dorsum of the hand.  There is no fluctuance, no paronychia and no induration of this part of the finger.  Psychiatric: He has a normal mood and affect. His behavior is normal.    ED Course  Procedures (including critical care time) Labs Review Labs Reviewed - No data to display  Imaging Review Dg Hand Complete Right  12/08/2013   CLINICAL DATA:  Swelling.  Pain .  EXAM: RIGHT HAND - COMPLETE 3+ VIEW  COMPARISON:  None.  FINDINGS: Diffuse severe soft tissue swelling noted of the right fourth digit. Joint space loss of the proximal interphalangeal joint with erosive changes noted. Differential diagnosis includes septic arthritis and inflammatory arthropathy such as psoriatic arthritis. No other acute abnormalities identified. No radiopaque foreign body .  IMPRESSION: 1. Severe soft tissue swelling right fourth digit. There is associated erosive arthropathy of the proximal interphalangeal joint. Differential diagnosis includes septic arthritis or inflammatory arthritis such as psoriatic arthritis. 2. No acute abnormality otherwise noted. No evidence of fracture or dislocation. No radiopaque foreign body.   Electronically Signed   By: Marcello Moores  Register   On: 12/08/2013 15:03      R hand / R 4th digit  R palmar    R AC  MDM   Final diagnoses:  Swelling of finger    Discussed care with Dr. Burney Gauze who agrees to abx and see in office in the AM.  WBC improving compared to baseline.    Meds given in ED:  Medications  vancomycin (VANCOCIN) IVPB 1000 mg/200 mL premix (not administered)    New Prescriptions   HYDROCODONE-ACETAMINOPHEN  (NORCO/VICODIN) 5-325 MG PER TABLET    Take 2 tablets by mouth every 4 (four) hours as needed.   NAPROXEN (NAPROSYN) 500 MG TABLET    Take 1 tablet (500 mg total) by mouth 2 (two) times daily with a meal.   SULFAMETHOXAZOLE-TRIMETHOPRIM (SEPTRA DS) 800-160 MG PER TABLET    Take 1 tablet by mouth every 12 (twelve) hours.        Johnna Acosta, MD 12/08/13 [phone]

## 2013-12-08 NOTE — ED Notes (Signed)
Pt c/o multiple wounds to skin.  Reports had a lot of bug bites on skin after some trees were cut down around his house.  Reports has history of herpes and chronic lymphatic leukemia.  Also c/o pain, redness, and swelling to right ring finger, unable to bend finger.  Reports finger pain started approx 1 week ago.  Reports has a pin in his finger from a surgery 20 years ago.

## 2013-12-08 NOTE — ED Notes (Signed)
Pt not ready for discharge. Antibiotic still infusing

## 2013-12-10 ENCOUNTER — Other Ambulatory Visit: Payer: Self-pay | Admitting: Orthopedic Surgery

## 2013-12-13 ENCOUNTER — Encounter (HOSPITAL_BASED_OUTPATIENT_CLINIC_OR_DEPARTMENT_OTHER): Payer: Self-pay | Admitting: *Deleted

## 2013-12-13 NOTE — Progress Notes (Signed)
ptsays he talked with dr Danice Goltz to stay overnight-has nobody to care for him-will bring all meds and overnight bag-he had leukemia tx [facility]-used to be on htn meds-does not take any-needs a PCP

## 2013-12-15 ENCOUNTER — Encounter (HOSPITAL_BASED_OUTPATIENT_CLINIC_OR_DEPARTMENT_OTHER)
Admission: RE | Disposition: A | Discharge status: Home / Self Care | Payer: Self-pay | Source: Ambulatory Visit | Attending: Orthopedic Surgery

## 2013-12-15 ENCOUNTER — Encounter (HOSPITAL_BASED_OUTPATIENT_CLINIC_OR_DEPARTMENT_OTHER): Payer: Self-pay | Admitting: Certified Registered"

## 2013-12-15 ENCOUNTER — Encounter (HOSPITAL_BASED_OUTPATIENT_CLINIC_OR_DEPARTMENT_OTHER): Payer: Medicaid Other | Admitting: Certified Registered"

## 2013-12-15 ENCOUNTER — Ambulatory Visit (HOSPITAL_BASED_OUTPATIENT_CLINIC_OR_DEPARTMENT_OTHER)
Admission: RE | Admit: 2013-12-15 | Discharge: 2013-12-16 | Disposition: A | Discharge status: Home / Self Care | Payer: Medicaid Other | Source: Ambulatory Visit | Attending: Orthopedic Surgery | Admitting: Orthopedic Surgery

## 2013-12-15 ENCOUNTER — Ambulatory Visit (HOSPITAL_BASED_OUTPATIENT_CLINIC_OR_DEPARTMENT_OTHER): Payer: Medicaid Other | Admitting: Certified Registered"

## 2013-12-15 DIAGNOSIS — I10 Essential (primary) hypertension: Secondary | ICD-10-CM | POA: Diagnosis not present

## 2013-12-15 DIAGNOSIS — M6580 Other synovitis and tenosynovitis, unspecified site: Secondary | ICD-10-CM | POA: Insufficient documentation

## 2013-12-15 DIAGNOSIS — M7130 Other bursal cyst, unspecified site: Secondary | ICD-10-CM | POA: Diagnosis present

## 2013-12-15 DIAGNOSIS — Z79899 Other long term (current) drug therapy: Secondary | ICD-10-CM | POA: Insufficient documentation

## 2013-12-15 DIAGNOSIS — C9110 Chronic lymphocytic leukemia of B-cell type not having achieved remission: Secondary | ICD-10-CM | POA: Diagnosis not present

## 2013-12-15 DIAGNOSIS — L4050 Arthropathic psoriasis, unspecified: Secondary | ICD-10-CM | POA: Diagnosis not present

## 2013-12-15 HISTORY — DX: Presence of dental prosthetic device (complete) (partial): Z97.2

## 2013-12-15 HISTORY — PX: SYNOVECTOMY: SHX5180

## 2013-12-15 HISTORY — DX: Essential (primary) hypertension: I10

## 2013-12-15 LAB — POCT HEMOGLOBIN-HEMACUE: Hemoglobin: 15 g/dL (ref 13.0–17.0)

## 2013-12-15 SURGERY — SYNOVECTOMY
Anesthesia: General | Site: Finger | Laterality: Right

## 2013-12-15 MED ORDER — OXYCODONE-ACETAMINOPHEN 5-325 MG PO TABS
1.0000 | ORAL_TABLET | ORAL | Status: DC | PRN
Start: 2013-12-15 — End: 2013-12-16

## 2013-12-15 MED ORDER — ONDANSETRON HCL 4 MG/2ML IJ SOLN: 4.0000 mg | Freq: Once | INTRAMUSCULAR | Status: DC | PRN

## 2013-12-15 MED ORDER — OXYCODONE-ACETAMINOPHEN 5-325 MG PO TABS: 1.0000 | ORAL_TABLET | ORAL | Status: DC | PRN

## 2013-12-15 MED ORDER — HYDROCODONE-ACETAMINOPHEN 5-325 MG PO TABS: 2.0000 | ORAL_TABLET | ORAL | Status: DC | PRN

## 2013-12-15 MED ORDER — IBRUTINIB 140 MG PO CAPS: 420.0000 mg | ORAL_CAPSULE | Freq: Every day | ORAL | Status: DC

## 2013-12-15 MED ORDER — PROPOFOL 10 MG/ML IV BOLUS
INTRAVENOUS | Status: DC | PRN
  Administered 2013-12-15: 200 mg via INTRAVENOUS

## 2013-12-15 MED ORDER — LIDOCAINE HCL (CARDIAC) 20 MG/ML IV SOLN
INTRAVENOUS | Status: DC | PRN
  Administered 2013-12-15: 80 mg via INTRAVENOUS

## 2013-12-15 MED ORDER — AMLODIPINE BESYLATE 5 MG PO TABS: 5.0000 mg | ORAL_TABLET | Freq: Every day | ORAL | Status: DC

## 2013-12-15 MED ORDER — NAPROXEN 500 MG PO TABS: 500.0000 mg | ORAL_TABLET | Freq: Two times a day (BID) | ORAL | Status: DC

## 2013-12-15 MED ORDER — LACTATED RINGERS IV SOLN
INTRAVENOUS | Status: DC
  Administered 2013-12-15: 12:00:00 via INTRAVENOUS

## 2013-12-15 MED ORDER — OXYCODONE HCL 5 MG PO TABS: 5.0000 mg | ORAL_TABLET | Freq: Once | ORAL | Status: DC | PRN

## 2013-12-15 MED ORDER — METOCLOPRAMIDE HCL 5 MG PO TABS: 5.0000 mg | ORAL_TABLET | Freq: Three times a day (TID) | ORAL | Status: DC | PRN

## 2013-12-15 MED ORDER — ONDANSETRON HCL 4 MG PO TABS: 4.0000 mg | ORAL_TABLET | Freq: Four times a day (QID) | ORAL | Status: DC | PRN

## 2013-12-15 MED ORDER — FENTANYL CITRATE 0.05 MG/ML IJ SOLN
INTRAMUSCULAR | Status: AC
  Filled 2013-12-15: qty 4

## 2013-12-15 MED ORDER — CHLORHEXIDINE GLUCONATE 4 % EX LIQD
60.0000 mL | Freq: Once | CUTANEOUS | Status: DC
Start: 2013-12-15 — End: 2013-12-15

## 2013-12-15 MED ORDER — FENTANYL CITRATE 0.05 MG/ML IJ SOLN: 50.0000 ug | INTRAMUSCULAR | Status: DC | PRN

## 2013-12-15 MED ORDER — ONDANSETRON HCL 4 MG/2ML IJ SOLN
INTRAMUSCULAR | Status: DC | PRN
  Administered 2013-12-15: 4 mg via INTRAVENOUS

## 2013-12-15 MED ORDER — MIDAZOLAM HCL 2 MG/2ML IJ SOLN: 1.0000 mg | INTRAMUSCULAR | Status: DC | PRN

## 2013-12-15 MED ORDER — MIDAZOLAM HCL 5 MG/5ML IJ SOLN
INTRAMUSCULAR | Status: DC | PRN
  Administered 2013-12-15: 2 mg via INTRAVENOUS

## 2013-12-15 MED ORDER — DEXTROSE 5 % IV SOLN
3.0000 g | INTRAVENOUS | Status: AC
  Administered 2013-12-15: 3 g via INTRAVENOUS

## 2013-12-15 MED ORDER — BUPIVACAINE HCL (PF) 0.25 % IJ SOLN
INTRAMUSCULAR | Status: DC | PRN
  Administered 2013-12-15: 6 mL

## 2013-12-15 MED ORDER — ONDANSETRON HCL 4 MG/2ML IJ SOLN: 4.0000 mg | Freq: Four times a day (QID) | INTRAMUSCULAR | Status: DC | PRN

## 2013-12-15 MED ORDER — METOCLOPRAMIDE HCL 5 MG/ML IJ SOLN: 5.0000 mg | Freq: Three times a day (TID) | INTRAMUSCULAR | Status: DC | PRN

## 2013-12-15 MED ORDER — MIDAZOLAM HCL 2 MG/2ML IJ SOLN
INTRAMUSCULAR | Status: AC
  Filled 2013-12-15: qty 2

## 2013-12-15 MED ORDER — OXYCODONE HCL 5 MG/5ML PO SOLN: 5.0000 mg | Freq: Once | ORAL | Status: DC | PRN

## 2013-12-15 MED ORDER — CEFAZOLIN SODIUM-DEXTROSE 2-3 GM-% IV SOLR
INTRAVENOUS | Status: AC
  Filled 2013-12-15: qty 100

## 2013-12-15 MED ORDER — HYDROMORPHONE HCL 1 MG/ML IJ SOLN: 0.2500 mg | INTRAMUSCULAR | Status: DC | PRN

## 2013-12-15 MED ORDER — PROPOFOL 10 MG/ML IV BOLUS
INTRAVENOUS | Status: AC
  Filled 2013-12-15: qty 20

## 2013-12-15 MED ORDER — DEXAMETHASONE SODIUM PHOSPHATE 10 MG/ML IJ SOLN
INTRAMUSCULAR | Status: DC | PRN
  Administered 2013-12-15: 10 mg via INTRAVENOUS

## 2013-12-15 MED ORDER — VALACYCLOVIR HCL 500 MG PO TABS
500.0000 mg | ORAL_TABLET | Freq: Two times a day (BID) | ORAL | Status: DC
  Administered 2013-12-15: 500 mg via ORAL

## 2013-12-15 MED ORDER — FENTANYL CITRATE 0.05 MG/ML IJ SOLN
INTRAMUSCULAR | Status: DC | PRN
  Administered 2013-12-15: 100 ug via INTRAVENOUS

## 2013-12-15 MED ORDER — SULFAMETHOXAZOLE-TMP DS 800-160 MG PO TABS
1.0000 | ORAL_TABLET | Freq: Two times a day (BID) | ORAL | Status: DC
  Administered 2013-12-15 – 2013-12-16 (×2): 1 via ORAL

## 2013-12-15 MED ORDER — SULFAMETHOXAZOLE-TRIMETHOPRIM 800-160 MG PO TABS: 1.0000 | ORAL_TABLET | Freq: Two times a day (BID) | ORAL | Status: DC

## 2013-12-15 MED ORDER — CEFAZOLIN SODIUM 1-5 GM-% IV SOLN
INTRAVENOUS | Status: AC
  Filled 2013-12-15: qty 50

## 2013-12-15 SURGICAL SUPPLY — 56 items
APL SKNCLS STERI-STRIP NONHPOA (GAUZE/BANDAGES/DRESSINGS)
BANDAGE ELASTIC 4 VELCRO ST LF (GAUZE/BANDAGES/DRESSINGS) ×2 IMPLANT
BENZOIN TINCTURE PRP APPL 2/3 (GAUZE/BANDAGES/DRESSINGS) IMPLANT
BLADE SURG 15 STRL LF DISP TIS (BLADE) ×1 IMPLANT
BLADE SURG 15 STRL SS (BLADE) ×2
BNDG CMPR 9X4 STRL LF SNTH (GAUZE/BANDAGES/DRESSINGS) ×1
BNDG CMPR MD 5X2 ELC HKLP STRL (GAUZE/BANDAGES/DRESSINGS)
BNDG ELASTIC 2 VLCR STRL LF (GAUZE/BANDAGES/DRESSINGS) IMPLANT
BNDG ESMARK 4X9 LF (GAUZE/BANDAGES/DRESSINGS) ×2 IMPLANT
BNDG GAUZE ELAST 4 BULKY (GAUZE/BANDAGES/DRESSINGS) ×2 IMPLANT
BRUSH SCRUB EZ PLAIN DRY (MISCELLANEOUS) IMPLANT
CORDS BIPOLAR (ELECTRODE) ×2 IMPLANT
COVER TABLE BACK 60X90 (DRAPES) ×2 IMPLANT
CUFF TOURNIQUET SINGLE 18IN (TOURNIQUET CUFF) ×2 IMPLANT
DECANTER SPIKE VIAL GLASS SM (MISCELLANEOUS) IMPLANT
DRAPE EXTREMITY TIBURON (DRAPES) ×2 IMPLANT
DRAPE SURG 17X23 STRL (DRAPES) ×2 IMPLANT
DURAPREP 26ML APPLICATOR (WOUND CARE) ×2 IMPLANT
GAUZE SPONGE 4X4 12PLY STRL (GAUZE/BANDAGES/DRESSINGS) ×2 IMPLANT
GAUZE XEROFORM 1X8 LF (GAUZE/BANDAGES/DRESSINGS) ×2 IMPLANT
GLOVE BIOGEL M STRL SZ7.5 (GLOVE) ×2 IMPLANT
GLOVE BIOGEL PI IND STRL 8 (GLOVE) ×1 IMPLANT
GLOVE BIOGEL PI INDICATOR 8 (GLOVE) ×1
GLOVE SURG SYN 8.0 (GLOVE) ×2 IMPLANT
GOWN STRL REUS W/ TWL LRG LVL3 (GOWN DISPOSABLE) IMPLANT
GOWN STRL REUS W/ TWL XL LVL3 (GOWN DISPOSABLE) ×1 IMPLANT
GOWN STRL REUS W/TWL LRG LVL3 (GOWN DISPOSABLE)
GOWN STRL REUS W/TWL XL LVL3 (GOWN DISPOSABLE) ×4 IMPLANT
NEEDLE HYPO 25X1 1.5 SAFETY (NEEDLE) ×2 IMPLANT
NS IRRIG 1000ML POUR BTL (IV SOLUTION) ×2 IMPLANT
PACK BASIN DAY SURGERY FS (CUSTOM PROCEDURE TRAY) ×2 IMPLANT
PAD CAST 4YDX4 CTTN HI CHSV (CAST SUPPLIES) ×1 IMPLANT
PADDING CAST ABS 4INX4YD NS (CAST SUPPLIES)
PADDING CAST ABS COTTON 4X4 ST (CAST SUPPLIES) IMPLANT
PADDING CAST COTTON 4X4 STRL (CAST SUPPLIES) ×2
PADDING UNDERCAST 2 STRL (CAST SUPPLIES)
PADDING UNDERCAST 2X4 STRL (CAST SUPPLIES) IMPLANT
SHEET MEDIUM DRAPE 40X70 STRL (DRAPES) ×2 IMPLANT
SLEEVE SCD COMPRESS KNEE MED (MISCELLANEOUS) IMPLANT
STOCKINETTE 4X48 STRL (DRAPES) ×2 IMPLANT
STRIP CLOSURE SKIN 1/2X4 (GAUZE/BANDAGES/DRESSINGS) IMPLANT
SUT ETHIBOND 3-0 V-5 (SUTURE) IMPLANT
SUT ETHILON 4 0 PS 2 18 (SUTURE) ×2 IMPLANT
SUT ETHILON 5 0 PS 2 18 (SUTURE) IMPLANT
SUT PROLENE 3 0 PS 2 (SUTURE) IMPLANT
SUT PROLENE 6 0 PC 1 (SUTURE) IMPLANT
SUT SILK 4 0 PS 2 (SUTURE) IMPLANT
SUT VIC AB 4-0 P-3 18XBRD (SUTURE) IMPLANT
SUT VIC AB 4-0 P3 18 (SUTURE)
SUT VICRYL RAPIDE 4-0 (SUTURE) IMPLANT
SUT VICRYL RAPIDE 4/0 PS 2 (SUTURE) IMPLANT
SYR BULB 3OZ (MISCELLANEOUS) ×2 IMPLANT
SYRINGE 10CC LL (SYRINGE) ×2 IMPLANT
TOWEL OR 17X24 6PK STRL BLUE (TOWEL DISPOSABLE) ×2 IMPLANT
TRAY DSU PREP LF (CUSTOM PROCEDURE TRAY) IMPLANT
UNDERPAD 30X30 INCONTINENT (UNDERPADS AND DIAPERS) ×2 IMPLANT

## 2013-12-15 NOTE — H&P (Signed)
Tim Duncan is an 63 y.o. male.   Chief Complaint: right ring finger pain and swelling HPI: as above with several week h/o increased right ring finger flexor sheath swelling  Past Medical History  Diagnosis Date  . Anemia 08/26/2012  . Cancer     cll  . CLL (chronic lymphocytic leukemia)   . Lymphadenopathy   . Lymphocytosis   . Thrombocytosis   . Wears dentures     top  . Hypertension     off meds    Past Surgical History  Procedure Laterality Date  . Portacath placement  3/14  . Fracture surgery  1990    rt ring finger  . Hernia repair  1992    umb  . Tonsillectomy    . Orif ankle fracture  1995    right    History reviewed. No pertinent family history. Social History:  reports that he quit smoking about 25 years ago. He does not have any smokeless tobacco history on file. He reports that he does not drink alcohol or use illicit drugs.  Allergies: No Known Allergies  Medications Prior to Admission  Medication Sig Dispense Refill  . amLODipine (NORVASC) 5 MG tablet Take 5 mg by mouth daily.      Marland Kitchen HYDROcodone-acetaminophen (NORCO/VICODIN) 5-325 MG per tablet Take 2 tablets by mouth every 4 (four) hours as needed.  10 tablet  0  . ibrutinib (IMBRUVICA) 140 MG capsul Take 420 mg by mouth daily.      . naproxen (NAPROSYN) 500 MG tablet Take 1 tablet (500 mg total) by mouth 2 (two) times daily with a meal.  30 tablet  0  . sulfamethoxazole-trimethoprim (SEPTRA DS) 800-160 MG per tablet Take 1 tablet by mouth every 12 (twelve) hours.  20 tablet  0  . valACYclovir (VALTREX) 500 MG tablet Take 500 mg by mouth 2 (two) times daily.        Results for orders placed during the hospital encounter of 12/15/13 (from the past 48 hour(s))  POCT HEMOGLOBIN-HEMACUE     Status: None   Collection Time    12/15/13 12:17 PM      Result Value Ref Range   Hemoglobin 15.0  13.0 - 17.0 g/dL   No results found.  Review of Systems  All other systems reviewed and are negative.   Blood  pressure 190/97, pulse 68, temperature 98.1 F (36.7 C), temperature source Oral, resp. rate 20, height 5\' 11"  (1.803 m), weight 128.368 kg (283 lb), SpO2 99.00%. Physical Exam  Constitutional: He is oriented to person, place, and time. He appears well-developed and well-nourished.  HENT:  Head: Normocephalic and atraumatic.  Cardiovascular: Normal rate.   Respiratory: Effort normal.  Musculoskeletal:       Right hand: He exhibits swelling.  Right ring finger flexor synovitis  Neurological: He is alert and oriented to person, place, and time.  Skin: Skin is warm.  Psychiatric: He has a normal mood and affect. His behavior is normal. Judgment and thought content normal.     Assessment/Plan As above   Plan right ring finger flexor synovectomy  Sayvon Arterberry A 12/15/2013, 12:37 PM

## 2013-12-15 NOTE — Op Note (Signed)
NAMESKYELER, SMOLA NO.:  [id number]  MEDICAL RECORD NO.:  [id number]  LOCATION:                               FACILITY:  [facility]  PHYSICIAN:  Sheral Apley. Itamar Mcgowan, M.D.DATE OF BIRTH:  07-23-1950  DATE OF PROCEDURE:  12/15/2013 DATE OF DISCHARGE:  12/16/2013                              OPERATIVE REPORT   PREOPERATIVE DIAGNOSIS:  Right ring finger flexor synovitis.  POSTOPERATIVE DIAGNOSIS:  Right ring finger flexor synovitis.  PROCEDURE:  Right ring finger flexor synovectomy with proximal interphalangeal joint debridement.  SURGEON:  Sheral Apley. Burney Gauze, M.D.  ASSISTANT:  None.  ANESTHESIA:  General.  TOURNIQUET TIME:  28 minutes.  SPECIMEN:  One specimen sent.  DESCRIPTION OF PROCEDURE:  The patient was taken to the operating suite. After the induction of adequate general anesthesia, right upper extremity was prepped and draped in sterile fashion.  An Esmarch was used to exsanguinate the limb.  Tourniquet was inflated to 275 mmHg.  At this point in time, a Brunner incision was made over the volar aspect of the ring finger on the right hand.  Flaps were raised accordingly. Dissection was carried down to the flexor sheath.  There was significant swelling over the flexor sheath between the A2 and A4 pulleys.  The sheath was incised between these 2 and a complete synovectomy was performed.  There was violation of the volar plate.  The PIP joint was also debrided with significant psoriatic arthritic type changes into the joint which was debrided thoroughly using a rongeur.  The wound was then thoroughly irrigated.  Hemostasis was achieved.  Bipolar cautery was loosely closed with 4-0 nylon.  Xeroform, 4x4s, fluffs, and a compression bandage was applied.  The patient tolerated the procedure well in concealed fashion.     Sheral Apley Burney Gauze, M.D.     MAW/MEDQ  D:  12/15/2013  T:  12/15/2013  Job:  [id number]

## 2013-12-15 NOTE — Op Note (Signed)
See note [phone]

## 2013-12-15 NOTE — Anesthesia Postprocedure Evaluation (Signed)
  Anesthesia Post-op Note  Patient: Tim Duncan  Procedure(s) Performed: Procedure(s): RIGHT RING FINGER FLEXOR SYNOVECTOMY (Right)  Patient Location: PACU  Anesthesia Type: General   Level of Consciousness: awake, alert  and oriented  Airway and Oxygen Therapy: Patient Spontanous Breathing  Post-op Pain: mild  Post-op Assessment: Post-op Vital signs reviewed  Post-op Vital Signs: Reviewed  Last Vitals:  Filed Vitals:   12/15/13 1400  BP: 167/61  Pulse: 62  Temp:   Resp: 14    Complications: No apparent anesthesia complications

## 2013-12-15 NOTE — Discharge Instructions (Signed)

## 2013-12-15 NOTE — Anesthesia Preprocedure Evaluation (Signed)
Anesthesia Evaluation  Patient identified by MRN, date of birth, ID band Patient awake    Reviewed: Allergy & Precautions, H&P , NPO status , Patient's Chart, lab work & pertinent test results  Airway Mallampati: I TM Distance: >3 FB     Dental  (+) Edentulous Upper, Upper Dentures, Dental Advisory Given   Pulmonary former smoker,  breath sounds clear to auscultation        Cardiovascular hypertension, Pt. on medications Rhythm:Regular Rate:Normal     Neuro/Psych    GI/Hepatic   Endo/Other    Renal/GU      Musculoskeletal   Abdominal   Peds  Hematology   Anesthesia Other Findings   Reproductive/Obstetrics                           Anesthesia Physical Anesthesia Plan  ASA: II  Anesthesia Plan: General   Post-op Pain Management:    Induction: Intravenous  Airway Management Planned: LMA  Additional Equipment:   Intra-op Plan:   Post-operative Plan: Extubation in OR  Informed Consent: I have reviewed the patients History and Physical, chart, labs and discussed the procedure including the risks, benefits and alternatives for the proposed anesthesia with the patient or authorized representative who has indicated his/her understanding and acceptance.   Dental advisory given  Plan Discussed with: CRNA, Anesthesiologist and Surgeon  Anesthesia Plan Comments:         Anesthesia Quick Evaluation

## 2013-12-15 NOTE — Transfer of Care (Signed)
Immediate Anesthesia Transfer of Care Note  Patient: Tim Duncan  Procedure(s) Performed: Procedure(s): RIGHT RING FINGER FLEXOR SYNOVECTOMY (Right)  Patient Location: PACU  Anesthesia Type:General  Level of Consciousness: awake, alert  and oriented  Airway & Oxygen Therapy: Patient Spontanous Breathing and Patient connected to face mask oxygen  Post-op Assessment: Report given to PACU RN, Post -op Vital signs reviewed and stable and Patient moving all extremities  Post vital signs: Reviewed and stable  Complications: No apparent anesthesia complications

## 2013-12-15 NOTE — Op Note (Deleted)
Tim Duncan, Tim Duncan NO.:  [id number]  MEDICAL RECORD NO.:  [id number]  LOCATION:                               FACILITY:  [place]  PHYSICIAN:  Sheral Apley. Kamaree Wheatley, M.D.DATE OF BIRTH:  1951/02/25  DATE OF PROCEDURE:  12/15/2013 DATE OF DISCHARGE:  12/16/2013                              OPERATIVE REPORT   PREOPERATIVE DIAGNOSIS:  Right ring finger flexor synovitis.  POSTOPERATIVE DIAGNOSIS:  Right ring finger flexor synovitis.  PROCEDURE:  Right ring finger flexor synovectomy with proximal interphalangeal joint debridement.  SURGEON:  Sheral Apley. Burney Gauze, M.D.  ASSISTANT:  None.  ANESTHESIA:  General.  TOURNIQUET TIME:  28 minutes.  SPECIMEN:  One specimen sent.  DESCRIPTION OF PROCEDURE:  The patient was taken to the operating suite. After the induction of adequate general anesthesia, right upper extremity was prepped and draped in sterile fashion.  An Esmarch was used to exsanguinate the limb.  Tourniquet was inflated to 275 mmHg.  At this point in time, a Brunner incision was made over the volar aspect of the ring finger on the right hand.  Flaps were raised accordingly. Dissection was carried down to the flexor sheath.  There was significant swelling over the flexor sheath between the A2 and A4 pulleys.  The sheath was incised between these 2 and a complete synovectomy was performed.  There was violation of the volar plate.  The PIP joint was also debrided with significant psoriatic arthritic type changes into the joint which was debrided thoroughly using a rongeur.  The wound was then thoroughly irrigated.  Hemostasis was achieved.  Bipolar cautery was loosely closed with 4-0 nylon.  Xeroform, 4x4s, fluffs, and a compression bandage was applied.  The patient tolerated the procedure well in concealed fashion.     Sheral Apley Burney Gauze, M.D.     MAW/MEDQ  D:  12/15/2013  T:  12/15/2013  Job:  [id number]

## 2013-12-15 NOTE — Anesthesia Procedure Notes (Signed)
Procedure Name: LMA Insertion Date/Time: 12/15/2013 12:53 PM Performed by: Baxter Flattery Pre-anesthesia Checklist: Patient identified, Emergency Drugs available, Suction available and Patient being monitored Patient Re-evaluated:Patient Re-evaluated prior to inductionOxygen Delivery Method: Circle System Utilized Preoxygenation: Pre-oxygenation with 100% oxygen Intubation Type: IV induction Ventilation: Mask ventilation without difficulty LMA: LMA inserted LMA Size: 5.0 Number of attempts: 1 Airway Equipment and Method: bite block Placement Confirmation: positive ETCO2 and breath sounds checked- equal and bilateral Tube secured with: Tape Dental Injury: Teeth and Oropharynx as per pre-operative assessment

## 2013-12-16 ENCOUNTER — Encounter (HOSPITAL_BASED_OUTPATIENT_CLINIC_OR_DEPARTMENT_OTHER): Payer: Self-pay | Admitting: Orthopedic Surgery

## 2013-12-16 DIAGNOSIS — M6580 Other synovitis and tenosynovitis, unspecified site: Secondary | ICD-10-CM | POA: Diagnosis not present

## 2014-02-02 DIAGNOSIS — E790 Hyperuricemia without signs of inflammatory arthritis and tophaceous disease: Secondary | ICD-10-CM | POA: Insufficient documentation

## 2014-02-02 DIAGNOSIS — Z0000 Encounter for general adult medical examination without abnormal findings: Secondary | ICD-10-CM | POA: Insufficient documentation

## 2014-02-28 ENCOUNTER — Encounter (HOSPITAL_COMMUNITY): Payer: Self-pay

## 2014-02-28 ENCOUNTER — Encounter (HOSPITAL_COMMUNITY): Payer: Medicaid Other | Attending: Oncology

## 2014-02-28 DIAGNOSIS — C9110 Chronic lymphocytic leukemia of B-cell type not having achieved remission: Secondary | ICD-10-CM | POA: Insufficient documentation

## 2014-02-28 DIAGNOSIS — Z452 Encounter for adjustment and management of vascular access device: Secondary | ICD-10-CM

## 2014-02-28 LAB — COMPREHENSIVE METABOLIC PANEL
ALT: 14 U/L (ref 0–53)
AST: 16 U/L (ref 0–37)
Albumin: 4.2 g/dL (ref 3.5–5.2)
Alkaline Phosphatase: 85 U/L (ref 39–117)
Anion gap: 13 (ref 5–15)
BUN: 12 mg/dL (ref 6–23)
CO2: 28 mEq/L (ref 19–32)
Calcium: 9.3 mg/dL (ref 8.4–10.5)
Chloride: 100 mEq/L (ref 96–112)
Creatinine, Ser: 0.8 mg/dL (ref 0.50–1.35)
GFR calc Af Amer: >90 mL/min (ref >90)
GFR calc non Af Amer: >90 mL/min (ref >90)
Glucose, Bld: 102 mg/dL — ABNORMAL HIGH (ref 70–99)
Potassium: 4.1 mEq/L (ref 3.7–5.3)
Sodium: 141 mEq/L (ref 137–147)
Total Bilirubin: 0.5 mg/dL (ref 0.3–1.2)
Total Protein: 7.5 g/dL (ref 6.0–8.3)

## 2014-02-28 LAB — CBC WITH DIFFERENTIAL/PLATELET
Basophils Absolute: 0 10*3/uL (ref 0.0–0.1)
Basophils Relative: 0 % (ref 0–1)
Eosinophils Absolute: 0.1 10*3/uL (ref 0.0–0.7)
Eosinophils Relative: 1 % (ref 0–5)
HCT: 39.9 % (ref 39.0–52.0)
Hemoglobin: 13.2 g/dL (ref 13.0–17.0)
Lymphocytes Relative: 70 % — ABNORMAL HIGH (ref 12–46)
Lymphs Abs: 13.1 10*3/uL — ABNORMAL HIGH (ref 0.7–4.0)
MCH: 29.9 pg (ref 26.0–34.0)
MCHC: 33.1 g/dL (ref 30.0–36.0)
MCV: 90.5 fL (ref 78.0–100.0)
Monocytes Absolute: 0.7 10*3/uL (ref 0.1–1.0)
Monocytes Relative: 4 % (ref 3–12)
Neutro Abs: 4.9 10*3/uL (ref 1.7–7.7)
Neutrophils Relative %: 26 % — ABNORMAL LOW (ref 43–77)
Platelets: 195 10*3/uL (ref 150–400)
RBC: 4.41 MIL/uL (ref 4.22–5.81)
RDW: 15.1 % (ref 11.5–15.5)
WBC: 18.8 10*3/uL — ABNORMAL HIGH (ref 4.0–10.5)

## 2014-02-28 MED ORDER — HEPARIN SOD (PORK) LOCK FLUSH 100 UNIT/ML IV SOLN
500.0000 [IU] | Freq: Once | INTRAVENOUS | Status: AC
  Administered 2014-02-28: 500 [IU] via INTRAVENOUS

## 2014-02-28 MED ORDER — HEPARIN SOD (PORK) LOCK FLUSH 100 UNIT/ML IV SOLN
INTRAVENOUS | Status: AC
  Filled 2014-02-28: qty 5

## 2014-02-28 MED ORDER — SODIUM CHLORIDE 0.9 % IJ SOLN
10.0000 mL | INTRAMUSCULAR | Status: DC | PRN
  Administered 2014-02-28 (×2): 10 mL via INTRAVENOUS
  Filled 2014-02-28 (×2): qty 10

## 2014-02-28 NOTE — Patient Instructions (Signed)
You had a port flush today with lab work. Please call the clinic if you have any questions or concerns

## 2014-02-28 NOTE — Progress Notes (Signed)
Tim Duncan presented for Portacath access and flush.  Proper placement of portacath confirmed by CXR.  Portacath located right chest wall accessed with  H 20 needle.  Good blood return present. Portacath flushed with 28ml NS and 500U/40ml Heparin and needle removed intact.  Procedure tolerated well and without incident.  Pt had double port.

## 2014-03-21 ENCOUNTER — Encounter (HOSPITAL_COMMUNITY): Payer: 59 | Attending: Oncology

## 2014-03-21 DIAGNOSIS — C9110 Chronic lymphocytic leukemia of B-cell type not having achieved remission: Secondary | ICD-10-CM | POA: Diagnosis present

## 2014-03-21 DIAGNOSIS — Z95828 Presence of other vascular implants and grafts: Secondary | ICD-10-CM

## 2014-03-21 DIAGNOSIS — Z452 Encounter for adjustment and management of vascular access device: Secondary | ICD-10-CM

## 2014-03-21 LAB — COMPREHENSIVE METABOLIC PANEL
ALT: 16 U/L (ref 0–53)
AST: 15 U/L (ref 0–37)
Albumin: 4.3 g/dL (ref 3.5–5.2)
Alkaline Phosphatase: 91 U/L (ref 39–117)
Anion gap: 16 — ABNORMAL HIGH (ref 5–15)
BUN: 13 mg/dL (ref 6–23)
CO2: 25 mEq/L (ref 19–32)
Calcium: 9.5 mg/dL (ref 8.4–10.5)
Chloride: 99 mEq/L (ref 96–112)
Creatinine, Ser: 0.72 mg/dL (ref 0.50–1.35)
GFR calc Af Amer: >90 mL/min (ref >90)
GFR calc non Af Amer: >90 mL/min (ref >90)
Glucose, Bld: 121 mg/dL — ABNORMAL HIGH (ref 70–99)
Potassium: 4 mEq/L (ref 3.7–5.3)
Sodium: 140 mEq/L (ref 137–147)
Total Bilirubin: 0.7 mg/dL (ref 0.3–1.2)
Total Protein: 7.6 g/dL (ref 6.0–8.3)

## 2014-03-21 LAB — CBC WITH DIFFERENTIAL/PLATELET
Basophils Absolute: 0.1 10*3/uL (ref 0.0–0.1)
Basophils Relative: 0 % (ref 0–1)
Eosinophils Absolute: 0.2 10*3/uL (ref 0.0–0.7)
Eosinophils Relative: 1 % (ref 0–5)
HCT: 42 % (ref 39.0–52.0)
Hemoglobin: 13.4 g/dL (ref 13.0–17.0)
Lymphocytes Relative: 74 % — ABNORMAL HIGH (ref 12–46)
Lymphs Abs: 24.3 10*3/uL — ABNORMAL HIGH (ref 0.7–4.0)
MCH: 29.3 pg (ref 26.0–34.0)
MCHC: 31.9 g/dL (ref 30.0–36.0)
MCV: 91.9 fL (ref 78.0–100.0)
Monocytes Absolute: 1.2 10*3/uL — ABNORMAL HIGH (ref 0.1–1.0)
Monocytes Relative: 4 % (ref 3–12)
Neutro Abs: 7.3 10*3/uL (ref 1.7–7.7)
Neutrophils Relative %: 22 % — ABNORMAL LOW (ref 43–77)
Platelets: 225 10*3/uL (ref 150–400)
RBC: 4.57 MIL/uL (ref 4.22–5.81)
RDW: 14.6 % (ref 11.5–15.5)
WBC: 33.1 10*3/uL — ABNORMAL HIGH (ref 4.0–10.5)

## 2014-03-21 LAB — LACTATE DEHYDROGENASE: LDH: 186 U/L (ref 94–250)

## 2014-03-21 LAB — URIC ACID: Uric Acid, Serum: 5.8 mg/dL (ref 4.0–7.8)

## 2014-03-21 LAB — MAGNESIUM: Magnesium: 1.9 mg/dL (ref 1.5–2.5)

## 2014-03-21 LAB — PHOSPHORUS: Phosphorus: 3.2 mg/dL (ref 2.3–4.6)

## 2014-03-21 MED ORDER — SODIUM CHLORIDE 0.9 % IJ SOLN
10.0000 mL | Freq: Once | INTRAMUSCULAR | Status: AC
  Administered 2014-03-21: 10 mL via INTRAVENOUS

## 2014-03-21 MED ORDER — HEPARIN SOD (PORK) LOCK FLUSH 100 UNIT/ML IV SOLN
INTRAVENOUS | Status: AC
  Filled 2014-03-21: qty 5

## 2014-03-21 MED ORDER — HEPARIN SOD (PORK) LOCK FLUSH 100 UNIT/ML IV SOLN
500.0000 [IU] | Freq: Once | INTRAVENOUS | Status: AC
  Administered 2014-03-21: 500 [IU] via INTRAVENOUS

## 2014-03-21 NOTE — Progress Notes (Signed)
Tim Duncan presented for Portacath access and flush. Proper placement of portacath confirmed by CXR. Double lumen Portacath located right chest wall accessed with  H 20 needle x2  both sides of port. Good blood return present from each side of port. Each side of Portacath flushed with 46ml NS and 500U/77ml Heparin and needle removed intact. Procedure without incident. Patient tolerated procedure well.

## 2014-03-21 NOTE — Patient Instructions (Signed)
[facility] Discharge Instructions  RECOMMENDATIONS MADE BY THE CONSULTANT AND ANY TEST RESULTS WILL BE SENT TO YOUR REFERRING PHYSICIAN.  INSTRUCTIONS/FOLLOW-UP: Labs and port flush done today. We will FAX lab results to [facility] as requested. Please schedule next port flush as needed.  Thank you for choosing [facility] to provide your oncology and hematology care.  To afford each patient quality time with our providers, please arrive at least 15 minutes before your scheduled appointment time.  With your help, our goal is to use those 15 minutes to complete the necessary work-up to ensure our physicians have the information they need to help with your evaluation and healthcare recommendations.    Effective January 1st, 2014, we ask that you re-schedule your appointment with our physicians should you arrive 10 or more minutes late for your appointment.  We strive to give you quality time with our providers, and arriving late affects you and other patients whose appointments are after yours.    Again, thank you for choosing [facility].  Our hope is that these requests will decrease the amount of time that you wait before being seen by our physicians.       _____________________________________________________________  Should you have questions after your visit to [facility], please contact our office at [phone] between the hours of 8:30 a.m. and 4:30 p.m.  Voicemails left after 4:30 p.m. will not be returned until the following business day.  For prescription refill requests, have your pharmacy contact our office with your prescription refill request.    _______________________________________________________________  We hope that we have given you very good care.  You may receive a patient satisfaction survey in the mail, please complete it and return it as soon as possible.  We value your  feedback!  _______________________________________________________________  Have you asked about our STAR program?  STAR stands for Survivorship Training and Rehabilitation, and this is a nationally recognized cancer care program that focuses on survivorship and rehabilitation.  Cancer and cancer treatments may cause problems, such as, pain, making you feel tired and keeping you from doing the things that you need or want to do. Cancer rehabilitation can help. Our goal is to reduce these troubling effects and help you have the best quality of life possible.  You may receive a survey from a nurse that asks questions about your current state of health.  Based on the survey results, all eligible patients will be referred to the [facility] program for an evaluation so we can better serve you!  A frequently asked questions sheet is available upon request.

## 2014-06-07 ENCOUNTER — Other Ambulatory Visit: Payer: Self-pay | Admitting: *Deleted

## 2014-06-07 DIAGNOSIS — I739 Peripheral vascular disease, unspecified: Secondary | ICD-10-CM

## 2014-06-13 ENCOUNTER — Encounter (HOSPITAL_COMMUNITY): Payer: Self-pay

## 2014-06-13 ENCOUNTER — Encounter (HOSPITAL_COMMUNITY): Payer: 59 | Attending: Hematology & Oncology

## 2014-06-13 VITALS — BP 184/84 | HR 94 | Temp 98.6°F | Resp 18

## 2014-06-13 DIAGNOSIS — C9110 Chronic lymphocytic leukemia of B-cell type not having achieved remission: Secondary | ICD-10-CM

## 2014-06-13 DIAGNOSIS — Z95828 Presence of other vascular implants and grafts: Secondary | ICD-10-CM

## 2014-06-13 DIAGNOSIS — Z452 Encounter for adjustment and management of vascular access device: Secondary | ICD-10-CM

## 2014-06-13 MED ORDER — HEPARIN SOD (PORK) LOCK FLUSH 100 UNIT/ML IV SOLN
500.0000 [IU] | Freq: Once | INTRAVENOUS | Status: AC
  Administered 2014-06-13: 500 [IU] via INTRAVENOUS
  Filled 2014-06-13: qty 5

## 2014-06-13 MED ORDER — SODIUM CHLORIDE 0.9 % IJ SOLN
10.0000 mL | INTRAMUSCULAR | Status: DC | PRN
  Administered 2014-06-13: 10 mL via INTRAVENOUS
  Filled 2014-06-13: qty 10

## 2014-06-13 MED ORDER — HEPARIN SOD (PORK) LOCK FLUSH 100 UNIT/ML IV SOLN
INTRAVENOUS | Status: AC
  Filled 2014-06-13: qty 5

## 2014-06-13 NOTE — Progress Notes (Signed)
.  Tim Duncan presented for Portacath access and flush. Dual  Portacath located rt chest wall accessed with  H 20 needles.  Good blood return present on lateral side, no blood return medial side, however flushes easily, no pain or swelling. Portacath flushed with 72ml NS and 500U/63ml Heparin and needle removed intact.  Procedure tolerated well and without incident.

## 2014-06-15 ENCOUNTER — Encounter (HOSPITAL_COMMUNITY): Payer: Self-pay

## 2014-06-15 ENCOUNTER — Other Ambulatory Visit: Payer: Self-pay

## 2014-06-15 ENCOUNTER — Encounter (HOSPITAL_COMMUNITY)
Admission: RE | Admit: 2014-06-15 | Discharge: 2014-06-15 | Disposition: A | Discharge status: Home / Self Care | Payer: 59 | Source: Ambulatory Visit | Attending: Ophthalmology | Admitting: Ophthalmology

## 2014-06-15 DIAGNOSIS — Z01812 Encounter for preprocedural laboratory examination: Secondary | ICD-10-CM | POA: Insufficient documentation

## 2014-06-15 DIAGNOSIS — Z01810 Encounter for preprocedural cardiovascular examination: Secondary | ICD-10-CM | POA: Insufficient documentation

## 2014-06-15 HISTORY — DX: Tremor, unspecified: R25.1

## 2014-06-15 HISTORY — DX: Benign prostatic hyperplasia without lower urinary tract symptoms: N40.0

## 2014-06-15 LAB — CBC
HCT: 43.4 % (ref 39.0–52.0)
Hemoglobin: 14.2 g/dL (ref 13.0–17.0)
MCH: 29.3 pg (ref 26.0–34.0)
MCHC: 32.7 g/dL (ref 30.0–36.0)
MCV: 89.5 fL (ref 78.0–100.0)
Platelets: 196 10*3/uL (ref 150–400)
RBC: 4.85 MIL/uL (ref 4.22–5.81)
RDW: 13.9 % (ref 11.5–15.5)
WBC: 18.7 10*3/uL — ABNORMAL HIGH (ref 4.0–10.5)

## 2014-06-15 LAB — BASIC METABOLIC PANEL
Anion gap: 9 (ref 5–15)
BUN: 19 mg/dL (ref 6–23)
CO2: 23 mmol/L (ref 19–32)
Calcium: 8.9 mg/dL (ref 8.4–10.5)
Chloride: 105 mmol/L (ref 96–112)
Creatinine, Ser: 0.94 mg/dL (ref 0.50–1.35)
GFR calc Af Amer: >90 mL/min (ref >90)
GFR calc non Af Amer: 88 mL/min — ABNORMAL LOW (ref >90)
Glucose, Bld: 133 mg/dL — ABNORMAL HIGH (ref 70–99)
Potassium: 3.9 mmol/L (ref 3.5–5.1)
Sodium: 137 mmol/L (ref 135–145)

## 2014-06-15 NOTE — Patient Instructions (Signed)
Your procedure is scheduled on: 06/21/2014  Report to [facility] at  52  AM.  Call this number if you have problems the morning of surgery: [phone]   Do not eat food or drink liquids :After Midnight.      Take these medicines the morning of surgery with A SIP OF WATER: allopurinol, amlodipine, imbruvia, avapro, flomax   Do not wear jewelry, make-up or nail polish.  Do not wear lotions, powders, or perfumes.   Do not shave 48 hours prior to surgery.  Do not bring valuables to the hospital.  Contacts, dentures or bridgework may not be worn into surgery.  Leave suitcase in the car. After surgery it may be brought to your room.  For patients admitted to the hospital, checkout time is 11:00 AM the day of discharge.   Patients discharged the day of surgery will not be allowed to drive home.  :     Please read over the following fact sheets that you were given: Coughing and Deep Breathing, Surgical Site Infection Prevention, Anesthesia Post-op Instructions and Care and Recovery After Surgery    Cataract A cataract is a clouding of the lens of the eye. When a lens becomes cloudy, vision is reduced based on the degree and nature of the clouding. Many cataracts reduce vision to some degree. Some cataracts make people more near-sighted as they develop. Other cataracts increase glare. Cataracts that are ignored and become worse can sometimes look white. The white color can be seen through the pupil. CAUSES   Aging. However, cataracts may occur at any age, even in newborns.   Certain drugs.   Trauma to the eye.   Certain diseases such as diabetes.   Specific eye diseases such as chronic inflammation inside the eye or a sudden attack of a rare form of glaucoma.   Inherited or acquired medical problems.  SYMPTOMS   Gradual, progressive drop in vision in the affected eye.   Severe, rapid visual loss. This most often happens when trauma is the cause.  DIAGNOSIS  To detect a cataract, an  eye doctor examines the lens. Cataracts are best diagnosed with an exam of the eyes with the pupils enlarged (dilated) by drops.  TREATMENT  For an early cataract, vision may improve by using different eyeglasses or stronger lighting. If that does not help your vision, surgery is the only effective treatment. A cataract needs to be surgically removed when vision loss interferes with your everyday activities, such as driving, reading, or watching TV. A cataract may also have to be removed if it prevents examination or treatment of another eye problem. Surgery removes the cloudy lens and usually replaces it with a substitute lens (intraocular lens, IOL).  At a time when both you and your doctor agree, the cataract will be surgically removed. If you have cataracts in both eyes, only one is usually removed at a time. This allows the operated eye to heal and be out of danger from any possible problems after surgery (such as infection or poor wound healing). In rare cases, a cataract may be doing damage to your eye. In these cases, your caregiver may advise surgical removal right away. The vast majority of people who have cataract surgery have better vision afterward. HOME CARE INSTRUCTIONS  If you are not planning surgery, you may be asked to do the following:  Use different eyeglasses.   Use stronger or brighter lighting.   Ask your eye doctor about reducing your medicine dose  or changing medicines if it is thought that a medicine caused your cataract. Changing medicines does not make the cataract go away on its own.   Become familiar with your surroundings. Poor vision can lead to injury. Avoid bumping into things on the affected side. You are at a higher risk for tripping or falling.   Exercise extreme care when driving or operating machinery.   Wear sunglasses if you are sensitive to bright light or experiencing problems with glare.  SEEK IMMEDIATE MEDICAL CARE IF:   You have a worsening or sudden  vision loss.   You notice redness, swelling, or increasing pain in the eye.   You have a fever.  Document Released: 03/18/2005 Document Revised: 03/07/2011 Document Reviewed: 11/09/2010 [facility] Patient Information 2012 [place].PATIENT INSTRUCTIONS POST-ANESTHESIA  IMMEDIATELY FOLLOWING SURGERY:  Do not drive or operate machinery for the first twenty four hours after surgery.  Do not make any important decisions for twenty four hours after surgery or while taking narcotic pain medications or sedatives.  If you develop intractable nausea and vomiting or a severe headache please notify your doctor immediately.  FOLLOW-UP:  Please make an appointment with your surgeon as instructed. You do not need to follow up with anesthesia unless specifically instructed to do so.  WOUND CARE INSTRUCTIONS (if applicable):  Keep a dry clean dressing on the anesthesia/puncture wound site if there is drainage.  Once the wound has quit draining you may leave it open to air.  Generally you should leave the bandage intact for twenty four hours unless there is drainage.  If the epidural site drains for more than 36-48 hours please call the anesthesia department.  QUESTIONS?:  Please feel free to call your physician or the hospital operator if you have any questions, and they will be happy to assist you.

## 2014-06-15 NOTE — Pre-Procedure Instructions (Signed)
Patient given information to sign up for my chart at home. 

## 2014-06-15 NOTE — Progress Notes (Signed)
   06/15/14 1257  OBSTRUCTIVE SLEEP APNEA  Have you ever been diagnosed with sleep apnea through a sleep study? No  Do you snore loudly (loud enough to be heard through closed doors)?  0  Do you often feel tired, fatigued, or sleepy during the daytime? 0  Has anyone observed you stop breathing during your sleep? 0  Do you have, or are you being treated for high blood pressure? 1  BMI more than 35 kg/m2? 1  Age over 63 years old? 1  Neck circumference greater than 40 cm/16 inches? 0  Gender: 1

## 2014-06-16 NOTE — Pre-Procedure Instructions (Signed)
Dr Patsey Berthold aware of WBC-18.7. No orders given.

## 2014-06-20 MED ORDER — CYCLOPENTOLATE-PHENYLEPHRINE OP SOLN OPTIME - NO CHARGE
OPHTHALMIC | Status: AC
  Filled 2014-06-20: qty 2

## 2014-06-20 MED ORDER — PHENYLEPHRINE HCL 2.5 % OP SOLN
OPHTHALMIC | Status: AC
  Filled 2014-06-20: qty 15

## 2014-06-20 MED ORDER — KETOROLAC TROMETHAMINE 0.5 % OP SOLN
OPHTHALMIC | Status: AC
  Filled 2014-06-20: qty 5

## 2014-06-20 MED ORDER — TETRACAINE HCL 0.5 % OP SOLN
OPHTHALMIC | Status: AC
  Filled 2014-06-20: qty 2

## 2014-06-21 ENCOUNTER — Ambulatory Visit (HOSPITAL_COMMUNITY): Admission: RE | Admit: 2014-06-21 | Payer: 59 | Source: Ambulatory Visit | Admitting: Ophthalmology

## 2014-06-21 ENCOUNTER — Ambulatory Visit (HOSPITAL_COMMUNITY): Payer: 59 | Admitting: Anesthesiology

## 2014-06-21 ENCOUNTER — Encounter (HOSPITAL_COMMUNITY)
Admission: RE | Disposition: A | Discharge status: Home / Self Care | Payer: Self-pay | Source: Ambulatory Visit | Attending: Ophthalmology

## 2014-06-21 ENCOUNTER — Ambulatory Visit (HOSPITAL_COMMUNITY)
Admission: RE | Admit: 2014-06-21 | Discharge: 2014-06-21 | Disposition: A | Discharge status: Home / Self Care | Payer: 59 | Source: Ambulatory Visit | Attending: Ophthalmology | Admitting: Ophthalmology

## 2014-06-21 DIAGNOSIS — H2511 Age-related nuclear cataract, right eye: Secondary | ICD-10-CM | POA: Diagnosis present

## 2014-06-21 HISTORY — PX: CATARACT EXTRACTION W/PHACO: SHX586

## 2014-06-21 SURGERY — PHACOEMULSIFICATION, CATARACT, WITH IOL INSERTION
Anesthesia: Monitor Anesthesia Care | Site: Eye | Laterality: Right

## 2014-06-21 MED ORDER — BSS IO SOLN
INTRAOCULAR | Status: DC | PRN
  Administered 2014-06-21: 15 mL via INTRAOCULAR

## 2014-06-21 MED ORDER — TETRACAINE 0.5 % OP SOLN OPTIME - NO CHARGE
OPHTHALMIC | Status: DC | PRN
  Administered 2014-06-21: 1 [drp] via OPHTHALMIC

## 2014-06-21 MED ORDER — EPINEPHRINE HCL 1 MG/ML IJ SOLN
INTRAOCULAR | Status: DC | PRN
  Administered 2014-06-21: 500 mL

## 2014-06-21 MED ORDER — FENTANYL CITRATE 0.05 MG/ML IJ SOLN
INTRAMUSCULAR | Status: AC
  Filled 2014-06-21: qty 2

## 2014-06-21 MED ORDER — PROVISC 10 MG/ML IO SOLN
INTRAOCULAR | Status: DC | PRN
  Administered 2014-06-21: 0.85 mL via INTRAOCULAR

## 2014-06-21 MED ORDER — MIDAZOLAM HCL 2 MG/2ML IJ SOLN
INTRAMUSCULAR | Status: AC
  Filled 2014-06-21: qty 2

## 2014-06-21 MED ORDER — MIDAZOLAM HCL 2 MG/2ML IJ SOLN
1.0000 mg | INTRAMUSCULAR | Status: DC | PRN
  Administered 2014-06-21: 2 mg via INTRAVENOUS

## 2014-06-21 MED ORDER — CYCLOPENTOLATE-PHENYLEPHRINE 0.2-1 % OP SOLN
1.0000 [drp] | OPHTHALMIC | Status: AC
  Administered 2014-06-21 (×3): 1 [drp] via OPHTHALMIC

## 2014-06-21 MED ORDER — PHENYLEPHRINE HCL 2.5 % OP SOLN
1.0000 [drp] | OPHTHALMIC | Status: AC
  Administered 2014-06-21 (×3): 1 [drp] via OPHTHALMIC

## 2014-06-21 MED ORDER — EPINEPHRINE HCL 1 MG/ML IJ SOLN
INTRAMUSCULAR | Status: AC
  Filled 2014-06-21: qty 1

## 2014-06-21 MED ORDER — LACTATED RINGERS IV SOLN
INTRAVENOUS | Status: DC
  Administered 2014-06-21: 1000 mL via INTRAVENOUS

## 2014-06-21 MED ORDER — FENTANYL CITRATE 0.05 MG/ML IJ SOLN
25.0000 ug | INTRAMUSCULAR | Status: AC
Start: 2014-06-21 — End: 2014-06-21
  Administered 2014-06-21: 25 ug via INTRAVENOUS

## 2014-06-21 MED ORDER — TETRACAINE HCL 0.5 % OP SOLN
1.0000 [drp] | OPHTHALMIC | Status: AC
  Administered 2014-06-21 (×3): 1 [drp] via OPHTHALMIC

## 2014-06-21 MED ORDER — KETOROLAC TROMETHAMINE 0.5 % OP SOLN
1.0000 [drp] | OPHTHALMIC | Status: AC
  Administered 2014-06-21 (×3): 1 [drp] via OPHTHALMIC

## 2014-06-21 SURGICAL SUPPLY — 24 items
CAPSULAR TENSION RING-AMO (OPHTHALMIC RELATED) IMPLANT
CLOTH BEACON ORANGE TIMEOUT ST (SAFETY) ×2 IMPLANT
EYE SHIELD UNIVERSAL CLEAR (GAUZE/BANDAGES/DRESSINGS) ×2 IMPLANT
GLOVE BIO SURGEON STRL SZ 6.5 (GLOVE) IMPLANT
GLOVE ECLIPSE 6.5 STRL STRAW (GLOVE) ×2 IMPLANT
GLOVE ECLIPSE 7.0 STRL STRAW (GLOVE) IMPLANT
GLOVE EXAM NITRILE LRG STRL (GLOVE) ×2 IMPLANT
GLOVE EXAM NITRILE MD LF STRL (GLOVE) IMPLANT
GLOVE SKINSENSE NS SZ6.5 (GLOVE)
GLOVE SKINSENSE STRL SZ6.5 (GLOVE) IMPLANT
HEALON 5 0.6 ML (INTRAOCULAR LENS) IMPLANT
KIT VITRECTOMY (OPHTHALMIC RELATED) IMPLANT
LENS ALC ACRYL/TECN (Ophthalmic Related) ×2 IMPLANT
PAD ARMBOARD 7.5X6 YLW CONV (MISCELLANEOUS) ×2 IMPLANT
PROC W NO LENS (INTRAOCULAR LENS)
PROC W SPEC LENS (INTRAOCULAR LENS)
PROCESS W NO LENS (INTRAOCULAR LENS) IMPLANT
PROCESS W SPEC LENS (INTRAOCULAR LENS) IMPLANT
RETRACTOR IRIS SIGHTPATH (OPHTHALMIC RELATED) IMPLANT
RING MALYGIN (MISCELLANEOUS) IMPLANT
TAPE SURG TRANSPORE 1 IN (GAUZE/BANDAGES/DRESSINGS) ×1 IMPLANT
TAPE SURGICAL TRANSPORE 1 IN (GAUZE/BANDAGES/DRESSINGS) ×1
VISCOELASTIC ADDITIONAL (OPHTHALMIC RELATED) IMPLANT
WATER STERILE IRR 250ML POUR (IV SOLUTION) ×2 IMPLANT

## 2014-06-21 NOTE — Transfer of Care (Signed)
Immediate Anesthesia Transfer of Care Note  Patient: Tim Duncan  Procedure(s) Performed: Procedure(s) (LRB): CATARACT EXTRACTION PHACO AND INTRAOCULAR LENS PLACEMENT; CDE:  11.10 (Right)  Patient Location: Shortstay  Anesthesia Type: MAC  Level of Consciousness: awake  Airway & Oxygen Therapy: Patient Spontanous Breathing   Post-op Assessment: Report given to PACU RN, Post -op Vital signs reviewed and stable and Patient moving all extremities  Post vital signs: Reviewed and stable  Complications: No apparent anesthesia complications

## 2014-06-21 NOTE — H&P (Signed)
The patient was re examined and there is no change in the patients condition since the original H and P. 

## 2014-06-21 NOTE — Discharge Instructions (Signed)
Lum Stillinger  06/21/2014           [place] Instructions [place] [address]      1. Avoid closing eyes tightly. One often closes the eye tightly when laughing, talking, sneezing, coughing or if they feel irritated. At these times, you should be careful not to close your eyes tightly.  2. Instill eye drops as instructed. To instill drops in your eye, open it, look up and have someone gently pull the lower lid down and instill a couple of drops inside the lower lid.  3. Do not touch upper lid.  4. Take Advil or Tylenol for pain.  5. You may use either eye for near work, such as reading or sewing and you may watch television.  6. You may have your hair done at the beauty parlor at any time.  7. Wear dark glasses with or without your own glasses if you are in bright light.  8. Call our office at [phone] or [phone] if you have sharp pain in your eye or unusual symptoms.  9. Do not be concerned because vision in the operative eye is not good. It will not be good, no matter how successful the operation, until you get a special lens for it. Your old glasses will not be suited to the new eye that was operated on and you will not be ready for a new lens for about a month.  10. Follow up at the [facility] office.    I have received a copy of the above instructions and will follow them.     Follow up today with Dr. Gershon Crane between 2-3 pm

## 2014-06-21 NOTE — Anesthesia Postprocedure Evaluation (Signed)
  Anesthesia Post-op Note  Patient: Tim Duncan  Procedure(s) Performed: Procedure(s) (LRB): CATARACT EXTRACTION PHACO AND INTRAOCULAR LENS PLACEMENT; CDE:  11.10 (Right)  Patient Location:  Short Stay  Anesthesia Type: MAC  Level of Consciousness: awake  Airway and Oxygen Therapy: Patient Spontanous Breathing  Post-op Pain: none  Post-op Assessment: Post-op Vital signs reviewed, Patient's Cardiovascular Status Stable, Respiratory Function Stable, Patent Airway, No signs of Nausea or vomiting and Pain level controlled  Post-op Vital Signs: Reviewed and stable  Complications: No apparent anesthesia complications

## 2014-06-21 NOTE — Anesthesia Preprocedure Evaluation (Signed)
Anesthesia Evaluation  Patient identified by MRN, date of birth, ID band Patient awake    Reviewed: Allergy & Precautions, H&P , NPO status , Patient's Chart, lab work & pertinent test results  Airway Mallampati: I  TM Distance: >3 FB     Dental  (+) Edentulous Upper, Upper Dentures, Dental Advisory Given   Pulmonary former smoker,  breath sounds clear to auscultation        Cardiovascular hypertension, Pt. on medications Rhythm:Regular Rate:Normal     Neuro/Psych    GI/Hepatic   Endo/Other    Renal/GU      Musculoskeletal   Abdominal   Peds  Hematology  (+) Blood dyscrasia (CLL), ,   Anesthesia Other Findings   Reproductive/Obstetrics                             Anesthesia Physical Anesthesia Plan  ASA: II  Anesthesia Plan: MAC   Post-op Pain Management:    Induction: Intravenous  Airway Management Planned: Nasal Cannula  Additional Equipment:   Intra-op Plan:   Post-operative Plan:   Informed Consent: I have reviewed the patients History and Physical, chart, labs and discussed the procedure including the risks, benefits and alternatives for the proposed anesthesia with the patient or authorized representative who has indicated his/her understanding and acceptance.     Plan Discussed with:   Anesthesia Plan Comments:         Anesthesia Quick Evaluation

## 2014-06-21 NOTE — Op Note (Signed)
Patient brought to the operating room and prepped and draped in the usual manner.  Lid speculum inserted in right eye.  Stab incision made at the twelve o'clock position.  Provisc instilled in the anterior chamber.   A 2.4 mm. Stab incision was made temporally.  An anterior capsulotomy was done with a bent 25 gauge needle.  The nucleus was hydrodissected.  The Phaco tip was inserted in the anterior chamber and the nucleus was emulsified.  CDE was 11.10.  The cortical material was then removed with the I and A tip.  Posterior capsule was the polished.  The anterior chamber was deepened with Provisc.  A 22.0 Diopter Alcon [id number] IOL was then inserted in the capsular bag.  Provisc was then removed with the I and A tip.  The wound was then hydrated.  Patient sent to the Recovery Room in good condition with follow up in my office.  Preoperative Diagnosis:  Nuclear Cataract OD Postoperative Diagnosis:  Same Procedure name: Kelman Phacoemulsification OD with IOL

## 2014-06-21 NOTE — Anesthesia Procedure Notes (Signed)
Procedure Name: MAC Date/Time: 06/21/2014 7:47 AM Performed by: Vista Deck Pre-anesthesia Checklist: Patient identified, Emergency Drugs available, Suction available, Timeout performed and Patient being monitored Patient Re-evaluated:Patient Re-evaluated prior to inductionOxygen Delivery Method: Nasal Cannula

## 2014-06-22 ENCOUNTER — Encounter: Payer: Self-pay | Admitting: Vascular Surgery

## 2014-06-22 ENCOUNTER — Encounter (HOSPITAL_COMMUNITY): Payer: Self-pay | Admitting: Ophthalmology

## 2014-06-23 ENCOUNTER — Ambulatory Visit (HOSPITAL_COMMUNITY)
Admission: RE | Admit: 2014-06-23 | Discharge: 2014-06-23 | Disposition: A | Discharge status: Home / Self Care | Payer: 59 | Source: Ambulatory Visit | Attending: Vascular Surgery | Admitting: Vascular Surgery

## 2014-06-23 ENCOUNTER — Ambulatory Visit (INDEPENDENT_AMBULATORY_CARE_PROVIDER_SITE_OTHER): Payer: 59 | Admitting: Vascular Surgery

## 2014-06-23 ENCOUNTER — Encounter: Payer: Self-pay | Admitting: Vascular Surgery

## 2014-06-23 ENCOUNTER — Other Ambulatory Visit: Payer: Self-pay

## 2014-06-23 ENCOUNTER — Ambulatory Visit (INDEPENDENT_AMBULATORY_CARE_PROVIDER_SITE_OTHER)
Admission: RE | Admit: 2014-06-23 | Discharge: 2014-06-23 | Disposition: A | Discharge status: Home / Self Care | Payer: 59 | Source: Ambulatory Visit | Attending: Vascular Surgery | Admitting: Vascular Surgery

## 2014-06-23 VITALS — BP 150/89 | HR 78 | Ht 71.5 in | Wt 306.4 lb

## 2014-06-23 DIAGNOSIS — T07XXXA Unspecified multiple injuries, initial encounter: Secondary | ICD-10-CM | POA: Insufficient documentation

## 2014-06-23 DIAGNOSIS — M79606 Pain in leg, unspecified: Secondary | ICD-10-CM | POA: Diagnosis not present

## 2014-06-23 DIAGNOSIS — T148 Other injury of unspecified body region: Secondary | ICD-10-CM | POA: Diagnosis not present

## 2014-06-23 DIAGNOSIS — I739 Peripheral vascular disease, unspecified: Secondary | ICD-10-CM

## 2014-06-23 DIAGNOSIS — L97919 Non-pressure chronic ulcer of unspecified part of right lower leg with unspecified severity: Secondary | ICD-10-CM | POA: Insufficient documentation

## 2014-06-23 DIAGNOSIS — I10 Essential (primary) hypertension: Secondary | ICD-10-CM | POA: Insufficient documentation

## 2014-06-23 DIAGNOSIS — I872 Venous insufficiency (chronic) (peripheral): Secondary | ICD-10-CM | POA: Diagnosis not present

## 2014-06-23 NOTE — Progress Notes (Signed)
Referred by:  Delphina Cahill, MD  [place], [place] [address]  Reason for referral: B foot ulcers  leg  History of Present Illness  Tim Duncan is a 64 y.o. ([birth year]) male who presents with chief complaint: ulcers in Both feet.  Patient was seen by his PCP in early 2016 and had abnormal coloration in his feet.  The patient periodically develops blacked patches of skin in his feet.  The patient denies any pain in the feet.  He denies intermittent claudication or rest pain.  Patient notes, onset of swelling years ago, associated with no obvious trigger.  The patient's symptoms include: bilateral leg swelling with occasional pruritus.  The patient has had never history of DVT, unknown history of varicose vein, no history of venous stasis ulcers, no history of  Lymphedema and no history of skin changes in lower legs.  There is no direct family history of venous disorders.  The patient has never used compression stockings in the past.  Past Medical History  Diagnosis Date  . Anemia 08/26/2012  . Cancer     cll  . CLL (chronic lymphocytic leukemia)   . Lymphadenopathy   . Lymphocytosis   . Thrombocytosis   . Wears dentures     top  . Hypertension     off meds  . Tremors of nervous system   . BPH (benign prostatic hyperplasia)     Past Surgical History  Procedure Laterality Date  . Portacath placement  3/14  . Fracture surgery  1990    rt ring finger  . Hernia repair  1992    umb  . Tonsillectomy    . Orif ankle fracture  1995    right  . Synovectomy Right 12/15/2013    Procedure: RIGHT RING FINGER FLEXOR SYNOVECTOMY;  Surgeon: Charlotte Crumb, MD;  Location: [facility];  Service: Orthopedics;  Laterality: Right;  . Cataract extraction w/phaco Right 06/21/2014    Procedure: CATARACT EXTRACTION PHACO AND INTRAOCULAR LENS PLACEMENT; CDE:  11.10;  Surgeon: Rutherford Guys, MD;  Location: [facility];  Service: Ophthalmology;  Laterality: Right;    History    Social History  . Marital Status: Single    Spouse Name: N/A  . Number of Children: N/A  . Years of Education: N/A   Occupational History  . Not on file.   Social History Main Topics  . Smoking status: Former Smoker -- 2.00 packs/day for 20 years    Types: Cigarettes    Quit date: 12/13/1988  . Smokeless tobacco: Not on file  . Alcohol Use: No  . Drug Use: No  . Sexual Activity: No   Other Topics Concern  . Not on file   Social History Narrative    Family History: unknown father's medical history, not aware of any medical problems in mother  Current Outpatient Prescriptions on File Prior to Visit  Medication Sig Dispense Refill  . allopurinol (ZYLOPRIM) 300 MG tablet Take 300 mg by mouth daily.    Marland Kitchen amLODipine (NORVASC) 5 MG tablet Take 10 mg by mouth daily.     Marland Kitchen ibrutinib (IMBRUVICA) 140 MG capsul Take 420 mg by mouth 3 (three) times daily.     . irbesartan (AVAPRO) 300 MG tablet Take 300 mg by mouth daily.    . tamsulosin (FLOMAX) 0.4 MG CAPS capsule Take 0.4 mg by mouth daily after supper.    . valACYclovir (VALTREX) 500 MG tablet Take 500 mg by mouth daily as  needed (outbreak).     Marland Kitchen HYDROcodone-acetaminophen (NORCO/VICODIN) 5-325 MG per tablet Take 2 tablets by mouth every 4 (four) hours as needed. (Patient not taking: Reported on 06/09/2014) 10 tablet 0  . naproxen (NAPROSYN) 500 MG tablet Take 1 tablet (500 mg total) by mouth 2 (two) times daily with a meal. (Patient not taking: Reported on 06/09/2014) 30 tablet 0  . oxyCODONE-acetaminophen (ROXICET) 5-325 MG per tablet Take 1 tablet by mouth every 4 (four) hours as needed for severe pain. (Patient not taking: Reported on 06/09/2014) 30 tablet 0  . sulfamethoxazole-trimethoprim (SEPTRA DS) 800-160 MG per tablet Take 1 tablet by mouth every 12 (twelve) hours. (Patient not taking: Reported on 06/09/2014) 20 tablet 0   No current facility-administered medications on file prior to visit.    No Known Allergies   REVIEW  OF SYSTEMS:  (Positives checked otherwise negative)  CARDIOVASCULAR:  []  chest pain, []  chest pressure, []  palpitations, []  shortness of breath when laying flat, []  shortness of breath with exertion,  []  pain in feet when walking, [x]  pain in feet when laying flat, []  history of blood clot in veins (DVT), []  history of phlebitis, []  swelling in legs, []  varicose veins  PULMONARY:  []  productive cough, []  asthma, []  wheezing  NEUROLOGIC:  []  weakness in arms or legs, []  numbness in arms or legs, []  difficulty speaking or slurred speech, []  temporary loss of vision in one eye, []  dizziness  HEMATOLOGIC:  []  bleeding problems, []  problems with blood clotting too easily  MUSCULOSKEL:  []  joint pain, []  joint swelling  GASTROINTEST:  []  vomiting blood, []  blood in stool     GENITOURINARY:  []  burning with urination, []  blood in urine  PSYCHIATRIC:  []  history of major depression  INTEGUMENTARY:  []  rashes, []  ulcers  CONSTITUTIONAL:  []  fever, []  chills   Physical Examination Filed Vitals:   06/23/14 1114 06/23/14 1120  BP: 153/86 150/89  Pulse: 78   Height: 5' 11.5" (1.816 m)   Weight: 306 lb 6.4 oz (138.982 kg)   SpO2: 96%    Body mass index is 42.14 kg/(m^2).  General: A&O x 3, WD, obese  Head: Noank/AT  Ear/Nose/Throat: Hearing grossly intact, nares w/o erythema or drainage, oropharynx w/o Erythema/Exudate  Eyes: PERRLA, EOMI  Neck: Supple, no nuchal rigidity, no palpable LAD  Pulmonary: Sym exp, good air movt, CTAB, no rales, rhonchi, & wheezing  Cardiac: RRR, Nl S1, S2, no Murmurs, rubs or gallops  Vascular: Vessel Right Left  Radial Palpable Palpable  Brachial Palpable Palpable  Carotid Palpable, without bruit Palpable, without bruit  Aorta Not palpable N/A  Femoral Palpable Palpable  Popliteal Not palpable Not palpable  PT Palpable Palpable  DP Palpable Palpable   Gastrointestinal: soft, NTND, -G/R, - HSM, - masses, - CVAT B, palpable upper mid-line hernia,  healed upper mid-line incision  Musculoskeletal: M/S 5/5 throughout , Extremities without ischemic changes , B feet with cyanosis, multiple black scabs: all removed with intact skin underneath, BLE edema 1+, faint visible varicose veins, R LDS  Neurologic: CN 2-12 intact , Pain and light touch intact in extremities including feet, Motor exam as listed above  Psychiatric: Judgment intact, Mood & affect appropriate for pt's clinical situation  Dermatologic: See M/S exam for extremity exam, no rashes otherwise noted  Lymph : No Cervical, Axillary, or Inguinal lymphadenopathy    Non-Invasive Vascular Imaging  ABI (Date: 06/23/2014)  R: 1.45, DP: tri, PT: tri, TBI: 1.13  L: 1.38, DP: tri,  PT: tri, TBI: 1.11    BLE Venous Insufficiency Duplex (Date: 06/23/2014):   RLE: no DVT and SVT, + GSV reflux, + deep venous reflux: CFV, FV, PV, SFJ  LLE: no DVT and SVT, + GSV reflux, + deep venous reflux: CFV, FV, PV, SFJ  Outside Studies/Documentation 4 pages of outside documents were reviewed including: PCP chart.  Medical Decision Making  Stefan Markarian is a 64 y.o. male who presents with: no evidence of bilateral PAD, possible traumatic injuries to both feet, BLE chronic venous insufficiency (C4).   The black lesions in his feet are consist with hemorrhage into skin, likely due to trauma, though the patient denies such.  After peeling off these skin lesion, normal skin was present.  This patient would likely benefit from Podiatry service including nail debridement and calus removal.    His cyanosis in his feet is consisted with chronic venous insufficiency rather than arterial insufficiency.  Based on the patient's history and examination, I recommend: compressive therapy.  I discussed with the patient the use of her 20-30 mm thigh high compression stockings and need for 3 month trial of such.  The patient will follow up in 3 months with my partners in the [facility] for evaluation for:  consideration for EVLA of either GSV.  Thank you for allowing [place] to participate in this patient's care.  Adele Barthel, MD [facility] Office: [phone] Pager: [phone]  06/23/2014, 1:54 PM

## 2014-07-07 ENCOUNTER — Encounter (HOSPITAL_COMMUNITY): Payer: Self-pay | Admitting: *Deleted

## 2014-07-07 ENCOUNTER — Encounter (HOSPITAL_COMMUNITY): Admission: RE | Admit: 2014-07-07 | Payer: 59 | Source: Ambulatory Visit

## 2014-07-11 MED ORDER — CYCLOPENTOLATE-PHENYLEPHRINE OP SOLN OPTIME - NO CHARGE
OPHTHALMIC | Status: AC
  Filled 2014-07-11: qty 2

## 2014-07-11 MED ORDER — TETRACAINE HCL 0.5 % OP SOLN
OPHTHALMIC | Status: AC
  Filled 2014-07-11: qty 2

## 2014-07-11 MED ORDER — PHENYLEPHRINE HCL 2.5 % OP SOLN
OPHTHALMIC | Status: AC
  Filled 2014-07-11: qty 15

## 2014-07-11 MED ORDER — KETOROLAC TROMETHAMINE 0.5 % OP SOLN
OPHTHALMIC | Status: AC
  Filled 2014-07-11: qty 5

## 2014-07-12 ENCOUNTER — Ambulatory Visit (HOSPITAL_COMMUNITY)
Admission: RE | Admit: 2014-07-12 | Discharge: 2014-07-12 | Disposition: A | Discharge status: Home / Self Care | Payer: 59 | Source: Ambulatory Visit | Attending: Ophthalmology | Admitting: Ophthalmology

## 2014-07-12 ENCOUNTER — Ambulatory Visit (HOSPITAL_COMMUNITY): Payer: 59 | Admitting: Anesthesiology

## 2014-07-12 ENCOUNTER — Encounter (HOSPITAL_COMMUNITY): Payer: Self-pay | Admitting: *Deleted

## 2014-07-12 ENCOUNTER — Encounter (HOSPITAL_COMMUNITY)
Admission: RE | Disposition: A | Discharge status: Home / Self Care | Payer: Self-pay | Source: Ambulatory Visit | Attending: Ophthalmology

## 2014-07-12 DIAGNOSIS — H2512 Age-related nuclear cataract, left eye: Secondary | ICD-10-CM | POA: Diagnosis present

## 2014-07-12 HISTORY — PX: CATARACT EXTRACTION W/PHACO: SHX586

## 2014-07-12 SURGERY — PHACOEMULSIFICATION, CATARACT, WITH IOL INSERTION
Anesthesia: Monitor Anesthesia Care | Site: Eye | Laterality: Left

## 2014-07-12 MED ORDER — MIDAZOLAM HCL 2 MG/2ML IJ SOLN
INTRAMUSCULAR | Status: AC
  Filled 2014-07-12: qty 2

## 2014-07-12 MED ORDER — PHENYLEPHRINE HCL 2.5 % OP SOLN
1.0000 [drp] | OPHTHALMIC | Status: AC
  Administered 2014-07-12 (×3): 1 [drp] via OPHTHALMIC

## 2014-07-12 MED ORDER — KETOROLAC TROMETHAMINE 0.5 % OP SOLN
1.0000 [drp] | OPHTHALMIC | Status: AC
  Administered 2014-07-12 (×3): 1 [drp] via OPHTHALMIC

## 2014-07-12 MED ORDER — FENTANYL CITRATE 0.05 MG/ML IJ SOLN
25.0000 ug | INTRAMUSCULAR | Status: AC
  Administered 2014-07-12 (×2): 25 ug via INTRAVENOUS

## 2014-07-12 MED ORDER — CYCLOPENTOLATE-PHENYLEPHRINE 0.2-1 % OP SOLN
1.0000 [drp] | OPHTHALMIC | Status: AC
  Administered 2014-07-12 (×3): 1 [drp] via OPHTHALMIC

## 2014-07-12 MED ORDER — BSS IO SOLN
INTRAOCULAR | Status: DC | PRN
  Administered 2014-07-12: 15 mL

## 2014-07-12 MED ORDER — MIDAZOLAM HCL 2 MG/2ML IJ SOLN
1.0000 mg | INTRAMUSCULAR | Status: DC | PRN
  Administered 2014-07-12: 2 mg via INTRAVENOUS

## 2014-07-12 MED ORDER — TETRACAINE HCL 0.5 % OP SOLN
1.0000 [drp] | OPHTHALMIC | Status: AC
  Administered 2014-07-12 (×3): 1 [drp] via OPHTHALMIC

## 2014-07-12 MED ORDER — EPINEPHRINE HCL 1 MG/ML IJ SOLN
INTRAMUSCULAR | Status: AC
  Filled 2014-07-12: qty 1

## 2014-07-12 MED ORDER — LACTATED RINGERS IV SOLN
INTRAVENOUS | Status: DC
  Administered 2014-07-12: 1000 mL via INTRAVENOUS

## 2014-07-12 MED ORDER — FENTANYL CITRATE 0.05 MG/ML IJ SOLN
INTRAMUSCULAR | Status: AC
  Filled 2014-07-12: qty 2

## 2014-07-12 MED ORDER — BSS IO SOLN
INTRAOCULAR | Status: DC | PRN
  Administered 2014-07-12: 500 mL

## 2014-07-12 MED ORDER — PROVISC 10 MG/ML IO SOLN
INTRAOCULAR | Status: DC | PRN
  Administered 2014-07-12: 0.85 mL via INTRAOCULAR

## 2014-07-12 SURGICAL SUPPLY — 8 items
CLOTH BEACON ORANGE TIMEOUT ST (SAFETY) IMPLANT
EYE SHIELD UNIVERSAL CLEAR (GAUZE/BANDAGES/DRESSINGS) ×2 IMPLANT
GLOVE BIO SURGEON STRL SZ 6.5 (GLOVE) IMPLANT
GLOVE EXAM NITRILE MD LF STRL (GLOVE) ×2 IMPLANT
LENS ALC ACRYL/TECN (Ophthalmic Related) ×2 IMPLANT
PAD ARMBOARD 7.5X6 YLW CONV (MISCELLANEOUS) IMPLANT
TAPE TRANSPORE STRL 2 31045 (GAUZE/BANDAGES/DRESSINGS) ×2 IMPLANT
WATER STERILE IRR 250ML POUR (IV SOLUTION) IMPLANT

## 2014-07-12 NOTE — Anesthesia Postprocedure Evaluation (Signed)
  Anesthesia Post-op Note  Patient: Tim Duncan  Procedure(s) Performed: Procedure(s) (LRB): CATARACT EXTRACTION PHACO AND INTRAOCULAR LENS PLACEMENT (IOC) (Left)  Patient Location:  Short Stay  Anesthesia Type: MAC  Level of Consciousness: awake  Airway and Oxygen Therapy: Patient Spontanous Breathing  Post-op Pain: none  Post-op Assessment: Post-op Vital signs reviewed, Patient's Cardiovascular Status Stable, Respiratory Function Stable, Patent Airway, No signs of Nausea or vomiting and Pain level controlled  Post-op Vital Signs: Reviewed and stable  Complications: No apparent anesthesia complications

## 2014-07-12 NOTE — Op Note (Signed)
Patient brought to the operating room and prepped and draped in the usual manner.  Lid speculum inserted in left eye.  Stab incision made at the twelve o'clock position.  Provisc instilled in the anterior chamber.   A 2.4 mm. Stab incision was made temporally.  An anterior capsulotomy was done with a bent 25 gauge needle.  The nucleus was hydrodissected.  The Phaco tip was inserted in the anterior chamber and the nucleus was emulsified.  CDE was 7.52.  The cortical material was then removed with the I and A tip.  Posterior capsule was the polished.  The anterior chamber was deepened with Provisc.  A 21.5 Diopter Alcon [id number] IOL was then inserted in the capsular bag.  Provisc was then removed with the I and A tip.  The wound was then hydrated.  Patient sent to the Recovery Room in good condition with follow up in my office.  Preoperative Diagnosis:  Nuclear Cataract OS Postoperative Diagnosis:  Same Procedure name: Kelman Phacoemulsification OS with IOL

## 2014-07-12 NOTE — Anesthesia Procedure Notes (Signed)
Procedure Name: MAC Date/Time: 07/12/2014 7:45 AM Performed by: Vista Deck Pre-anesthesia Checklist: Patient identified, Emergency Drugs available, Suction available, Timeout performed and Patient being monitored Patient Re-evaluated:Patient Re-evaluated prior to inductionOxygen Delivery Method: Nasal Cannula

## 2014-07-12 NOTE — Transfer of Care (Signed)
Immediate Anesthesia Transfer of Care Note  Patient: Tim Duncan  Procedure(s) Performed: Procedure(s) (LRB): CATARACT EXTRACTION PHACO AND INTRAOCULAR LENS PLACEMENT (IOC) (Left)  Patient Location: Shortstay  Anesthesia Type: MAC  Level of Consciousness: awake  Airway & Oxygen Therapy: Patient Spontanous Breathing   Post-op Assessment: Report given to PACU RN, Post -op Vital signs reviewed and stable and Patient moving all extremities  Post vital signs: Reviewed and stable  Complications: No apparent anesthesia complications

## 2014-07-12 NOTE — H&P (Signed)
The patient was re examined and there is no change in the patients condition since the original H and P. 

## 2014-07-12 NOTE — Discharge Instructions (Signed)
Honor Fairbank  07/12/2014           [facility] Instructions [place] [address]      1. Avoid closing eyes tightly. One often closes the eye tightly when laughing, talking, sneezing, coughing or if they feel irritated. At these times, you should be careful not to close your eyes tightly.  2. Instill eye drops as instructed. To instill drops in your eye, open it, look up and have someone gently pull the lower lid down and instill a couple of drops inside the lower lid.  3. Do not touch upper lid.  4. Take Advil or Tylenol for pain.  5. You may use either eye for near work, such as reading or sewing and you may watch television.  6. You may have your hair done at the beauty parlor at any time.  7. Wear dark glasses with or without your own glasses if you are in bright light.  8. Call our office at [phone] or [phone] if you have sharp pain in your eye or unusual symptoms.  9. Do not be concerned because vision in the operative eye is not good. It will not be good, no matter how successful the operation, until you get a special lens for it. Your old glasses will not be suited to the new eye that was operated on and you will not be ready for a new lens for about a month.  10. Follow up at the [facility] office.    I have received a copy of the above instructions and will follow them.

## 2014-07-12 NOTE — Anesthesia Preprocedure Evaluation (Signed)
Anesthesia Evaluation  Patient identified by MRN, date of birth, ID band Patient awake    Reviewed: Allergy & Precautions, H&P , NPO status , Patient's Chart, lab work & pertinent test results  Airway Mallampati: I  TM Distance: >3 FB     Dental  (+) Edentulous Upper, Upper Dentures, Dental Advisory Given   Pulmonary former smoker,  breath sounds clear to auscultation        Cardiovascular hypertension, Pt. on medications Rhythm:Regular Rate:Normal     Neuro/Psych    GI/Hepatic   Endo/Other    Renal/GU      Musculoskeletal   Abdominal   Peds  Hematology  (+) Blood dyscrasia (CLL), ,   Anesthesia Other Findings   Reproductive/Obstetrics                             Anesthesia Physical Anesthesia Plan  ASA: II  Anesthesia Plan: MAC   Post-op Pain Management:    Induction: Intravenous  Airway Management Planned: Nasal Cannula  Additional Equipment:   Intra-op Plan:   Post-operative Plan:   Informed Consent: I have reviewed the patients History and Physical, chart, labs and discussed the procedure including the risks, benefits and alternatives for the proposed anesthesia with the patient or authorized representative who has indicated his/her understanding and acceptance.     Plan Discussed with:   Anesthesia Plan Comments:         Anesthesia Quick Evaluation

## 2014-07-13 ENCOUNTER — Encounter (HOSPITAL_COMMUNITY): Payer: Self-pay | Admitting: Ophthalmology

## 2014-09-05 ENCOUNTER — Encounter (HOSPITAL_COMMUNITY): Admission: RE | Admit: 2014-09-05 | Payer: 59 | Source: Ambulatory Visit

## 2014-09-06 ENCOUNTER — Encounter (HOSPITAL_COMMUNITY)
Admission: RE | Admit: 2014-09-06 | Discharge: 2014-09-06 | Disposition: A | Discharge status: Home / Self Care | Payer: 59 | Source: Ambulatory Visit | Attending: Oncology | Admitting: Oncology

## 2014-09-06 ENCOUNTER — Encounter (HOSPITAL_COMMUNITY): Payer: Self-pay

## 2014-09-06 DIAGNOSIS — Z452 Encounter for adjustment and management of vascular access device: Secondary | ICD-10-CM | POA: Insufficient documentation

## 2014-09-06 DIAGNOSIS — C9110 Chronic lymphocytic leukemia of B-cell type not having achieved remission: Secondary | ICD-10-CM | POA: Insufficient documentation

## 2014-09-06 MED ORDER — SODIUM CHLORIDE 0.9 % IJ SOLN
10.0000 mL | INTRAMUSCULAR | Status: AC | PRN
  Administered 2014-09-06: 10 mL

## 2014-09-06 MED ORDER — HEPARIN SOD (PORK) LOCK FLUSH 100 UNIT/ML IV SOLN
INTRAVENOUS | Status: AC
  Filled 2014-09-06: qty 5

## 2014-09-06 MED ORDER — HEPARIN SOD (PORK) LOCK FLUSH 100 UNIT/ML IV SOLN
500.0000 [IU] | INTRAVENOUS | Status: AC | PRN
  Administered 2014-09-06: 500 [IU]
  Filled 2014-09-06: qty 5

## 2014-09-06 NOTE — Progress Notes (Signed)
Patient stated that he would call after he saw his doctor on July 28 to make appointment for port flush. Patient will bring new orders with him.

## 2014-09-27 ENCOUNTER — Ambulatory Visit: Payer: 59 | Admitting: Vascular Surgery

## 2014-10-07 ENCOUNTER — Encounter: Payer: Self-pay | Admitting: Vascular Surgery

## 2014-10-11 ENCOUNTER — Ambulatory Visit (INDEPENDENT_AMBULATORY_CARE_PROVIDER_SITE_OTHER): Payer: 59 | Admitting: Vascular Surgery

## 2014-10-11 ENCOUNTER — Encounter: Payer: Self-pay | Admitting: Vascular Surgery

## 2014-10-11 VITALS — BP 136/72 | HR 77 | Ht 71.5 in | Wt 305.3 lb

## 2014-10-11 DIAGNOSIS — I83893 Varicose veins of bilateral lower extremities with other complications: Secondary | ICD-10-CM

## 2014-10-11 DIAGNOSIS — I83899 Varicose veins of unspecified lower extremities with other complications: Secondary | ICD-10-CM | POA: Insufficient documentation

## 2014-10-11 NOTE — Progress Notes (Signed)
Problems with Activities of Daily Living Secondary to Leg Pain  1. Mr. Forcier states he has difficulty falling asleep due to leg pain.    2. Mr. Barthelemy states that all activities that require prolonged standing (cooking, cleaning, shopping) are very difficult due to leg pain.   3. Mr. Corman states that exercise is very difficult due to leg pain.     Failure of  Conservative Therapy:  1. Worn 20-30 mm Hg thigh high compression hose >3 months with no relief of symptoms.  2. Frequently elevates legs-no relief of symptoms  3. Taken Ibuprofen 600 Mg TID with no relief of symptoms.  Today for continued discussion of his bilateral venous hypertension. He reports that the compression hose actually make his legs feel worse. He does have severe changes of chronic venous hypertension on his right leg with the hemosiderin deposit and CEAP class 4 changes. He has marked telangiectasia around his left ankle but no severe skin changes.  He does have 2+ dorsalis pedis pulses bilaterally  I reimage his veins with SonoSite he does have enlarged saphenous vein bilaterally.  Failed conservative treatment of venous hypertension. I would recommend laser ablation of his right great saphenous vein due to severe changes of his skin related to his venous hypertension. I would recommend continued observation of his left leg since he is not having significant swelling and mild pain. Explained that he would be a candidate for ablation should he have progression of pain or skin changes on the left. He does have difficulty regarding arranging transportation since he has no family in the area. We will coordinate this with him as well to help as we can. He will check with neighbors to see if they can drive him to and from his treatment.

## 2014-11-09 NOTE — Addendum Note (Signed)
Encounter addended by: Frederik Pear, RN on: 11/09/2014  8:28 AM<BR>     Documentation filed: Charges VN

## 2014-11-15 ENCOUNTER — Other Ambulatory Visit: Payer: Self-pay | Admitting: *Deleted

## 2014-11-15 DIAGNOSIS — I83893 Varicose veins of bilateral lower extremities with other complications: Secondary | ICD-10-CM

## 2014-11-17 DIAGNOSIS — I10 Essential (primary) hypertension: Secondary | ICD-10-CM | POA: Diagnosis not present

## 2014-11-24 DIAGNOSIS — R7301 Impaired fasting glucose: Secondary | ICD-10-CM | POA: Diagnosis not present

## 2014-11-24 DIAGNOSIS — E876 Hypokalemia: Secondary | ICD-10-CM | POA: Diagnosis not present

## 2014-11-24 DIAGNOSIS — C9110 Chronic lymphocytic leukemia of B-cell type not having achieved remission: Secondary | ICD-10-CM | POA: Diagnosis not present

## 2014-11-24 DIAGNOSIS — I10 Essential (primary) hypertension: Secondary | ICD-10-CM | POA: Diagnosis not present

## 2014-11-24 DIAGNOSIS — E785 Hyperlipidemia, unspecified: Secondary | ICD-10-CM | POA: Diagnosis not present

## 2014-11-29 DIAGNOSIS — Z961 Presence of intraocular lens: Secondary | ICD-10-CM | POA: Diagnosis not present

## 2014-12-07 ENCOUNTER — Encounter: Payer: Self-pay | Admitting: Vascular Surgery

## 2014-12-08 ENCOUNTER — Encounter (HOSPITAL_COMMUNITY): Payer: 59

## 2014-12-08 ENCOUNTER — Ambulatory Visit (INDEPENDENT_AMBULATORY_CARE_PROVIDER_SITE_OTHER): Payer: 59 | Admitting: Vascular Surgery

## 2014-12-08 ENCOUNTER — Encounter: Payer: Self-pay | Admitting: Vascular Surgery

## 2014-12-08 VITALS — BP 136/78 | HR 89 | Temp 98.0°F | Resp 16 | Ht 71.5 in | Wt 306.0 lb

## 2014-12-08 DIAGNOSIS — I83893 Varicose veins of bilateral lower extremities with other complications: Secondary | ICD-10-CM | POA: Diagnosis not present

## 2014-12-08 HISTORY — PX: ENDOVENOUS ABLATION SAPHENOUS VEIN W/ LASER: SUR449

## 2014-12-08 NOTE — Progress Notes (Signed)
     Laser Ablation Procedure    Date: 12/08/2014   Tim Duncan DOB:03-12-1951  Consent signed: Yes    Surgeon:  Dr. Sherren Mocha Jaydeen Odor  Procedure: Laser Ablation: right Greater Saphenous Vein  BP 136/78 mmHg  Pulse 89  Temp(Src) 98 F (36.7 C) (Oral)  Resp 16  Ht 5' 11.5" (1.816 m)  Wt 306 lb (138.801 kg)  BMI 42.09 kg/m2  SpO2 95%  Tumescent Anesthesia: 475 cc 0.9% NaCl with 50 cc Lidocaine HCL with 1% Epi and 15 cc 8.4% NaHCO3  Local Anesthesia: 3 cc Lidocaine HCL and NaHCO3 (ratio 2:1)  15 watts continuous mode        Total energy: 3040 Joules   Total time: 3:22      Patient tolerated procedure well    Description of Procedure:  After marking the course of the secondary varicosities, the patient was placed on the operating table in the supine position, and the right leg was prepped and draped in sterile fashion.   Local anesthetic was administered and under ultrasound guidance the saphenous vein was accessed with a micro needle and guide wire; then the mirco puncture sheath was placed.  A guide wire was inserted saphenofemoral junction , followed by a 5 french sheath.  The position of the sheath and then the laser fiber below the junction was confirmed using the ultrasound.  Tumescent anesthesia was administered along the course of the saphenous vein using ultrasound guidance. The patient was placed in Trendelenburg position and protective laser glasses were placed on patient and staff, and the laser was fired at 15 watts continuous mode advancing 1-27mm/second for a total of 3040 joules.       Steri strips were applied and ABD pads and thigh high compression stockings were applied.  Ace wrap bandages were applied over at the top of the saphenofemoral junction. Blood loss was less than 15 cc.  The patient ambulated out of the operating room having tolerated the procedure well.

## 2014-12-19 ENCOUNTER — Encounter: Payer: Self-pay | Admitting: Vascular Surgery

## 2014-12-20 ENCOUNTER — Ambulatory Visit (INDEPENDENT_AMBULATORY_CARE_PROVIDER_SITE_OTHER): Payer: Medicare Other | Admitting: Vascular Surgery

## 2014-12-20 ENCOUNTER — Ambulatory Visit (HOSPITAL_COMMUNITY)
Admission: RE | Admit: 2014-12-20 | Discharge: 2014-12-20 | Disposition: A | Discharge status: Home / Self Care | Payer: Medicare Other | Source: Ambulatory Visit | Attending: Vascular Surgery | Admitting: Vascular Surgery

## 2014-12-20 ENCOUNTER — Encounter: Payer: Self-pay | Admitting: Vascular Surgery

## 2014-12-20 VITALS — BP 141/79 | HR 70 | Temp 98.2°F | Resp 16 | Ht 71.5 in | Wt 308.0 lb

## 2014-12-20 DIAGNOSIS — I10 Essential (primary) hypertension: Secondary | ICD-10-CM | POA: Insufficient documentation

## 2014-12-20 DIAGNOSIS — I83893 Varicose veins of bilateral lower extremities with other complications: Secondary | ICD-10-CM | POA: Insufficient documentation

## 2014-12-20 DIAGNOSIS — Z9889 Other specified postprocedural states: Secondary | ICD-10-CM | POA: Insufficient documentation

## 2014-12-20 NOTE — Progress Notes (Signed)
Patient is here today for follow-up of laser ablation of his right great saphenous vein on 12/08/2014. He had no difficulty following the procedure and reports that his leg feels better and that he feels as though he is having less swelling around his right distal calf and ankle. He has been extremely compliant with his knee-high compression.  On physical exam his ablation site that looks good. There is no evidence of skin injury. He has an easily palpable saphenous vein thrombosed from the distal insertion of his mid calf up to the level of his knee. The vein then takes a deeper course.  Duplex today shows closure of his great saphenous vein from the mid Insertion to 8 cm below the saphenofemoral femoral junction.  Impression and plan successful laser ablation for severe venous hypertension in his right great saphenous vein. Have recommended continued lifelong compression garment use due to his known deep venous reflux as well. He does have a saphenous vein reflux in the left but is having minimal symptoms and no skin changes. Recommend observation of this only at this time. Is comfortable with this discussion will see [place] again on an as-needed basis

## 2015-01-25 DIAGNOSIS — J90 Pleural effusion, not elsewhere classified: Secondary | ICD-10-CM | POA: Diagnosis not present

## 2015-01-25 DIAGNOSIS — I4891 Unspecified atrial fibrillation: Secondary | ICD-10-CM | POA: Diagnosis not present

## 2015-01-25 DIAGNOSIS — M7989 Other specified soft tissue disorders: Secondary | ICD-10-CM | POA: Diagnosis not present

## 2015-01-25 DIAGNOSIS — R000 Tachycardia, unspecified: Secondary | ICD-10-CM | POA: Diagnosis not present

## 2015-01-25 DIAGNOSIS — Z833 Family history of diabetes mellitus: Secondary | ICD-10-CM | POA: Diagnosis not present

## 2015-01-25 DIAGNOSIS — Z6830 Body mass index (BMI) 30.0-30.9, adult: Secondary | ICD-10-CM | POA: Diagnosis not present

## 2015-01-25 DIAGNOSIS — I10 Essential (primary) hypertension: Secondary | ICD-10-CM | POA: Diagnosis not present

## 2015-01-25 DIAGNOSIS — I517 Cardiomegaly: Secondary | ICD-10-CM | POA: Diagnosis not present

## 2015-01-25 DIAGNOSIS — Z9221 Personal history of antineoplastic chemotherapy: Secondary | ICD-10-CM | POA: Diagnosis not present

## 2015-01-25 DIAGNOSIS — Z87891 Personal history of nicotine dependence: Secondary | ICD-10-CM | POA: Diagnosis not present

## 2015-01-25 DIAGNOSIS — C9110 Chronic lymphocytic leukemia of B-cell type not having achieved remission: Secondary | ICD-10-CM | POA: Diagnosis not present

## 2015-01-25 DIAGNOSIS — B009 Herpesviral infection, unspecified: Secondary | ICD-10-CM | POA: Diagnosis not present

## 2015-01-25 DIAGNOSIS — I481 Persistent atrial fibrillation: Secondary | ICD-10-CM | POA: Diagnosis not present

## 2015-01-25 DIAGNOSIS — E669 Obesity, unspecified: Secondary | ICD-10-CM | POA: Diagnosis not present

## 2015-01-25 DIAGNOSIS — E785 Hyperlipidemia, unspecified: Secondary | ICD-10-CM | POA: Diagnosis not present

## 2015-02-01 DIAGNOSIS — I482 Chronic atrial fibrillation: Secondary | ICD-10-CM | POA: Diagnosis not present

## 2015-03-06 ENCOUNTER — Encounter (HOSPITAL_COMMUNITY)
Admission: RE | Admit: 2015-03-06 | Discharge: 2015-03-06 | Disposition: A | Discharge status: Home / Self Care | Payer: Medicare Other | Source: Ambulatory Visit | Attending: Oncology | Admitting: Oncology

## 2015-03-06 DIAGNOSIS — Z029 Encounter for administrative examinations, unspecified: Secondary | ICD-10-CM | POA: Insufficient documentation

## 2015-03-06 MED ORDER — HEPARIN SOD (PORK) LOCK FLUSH 100 UNIT/ML IV SOLN
INTRAVENOUS | Status: AC
  Filled 2015-03-06: qty 5

## 2015-03-10 DIAGNOSIS — E782 Mixed hyperlipidemia: Secondary | ICD-10-CM | POA: Diagnosis not present

## 2015-03-10 DIAGNOSIS — Z125 Encounter for screening for malignant neoplasm of prostate: Secondary | ICD-10-CM | POA: Diagnosis not present

## 2015-03-10 DIAGNOSIS — R7301 Impaired fasting glucose: Secondary | ICD-10-CM | POA: Diagnosis not present

## 2015-03-15 DIAGNOSIS — E782 Mixed hyperlipidemia: Secondary | ICD-10-CM | POA: Diagnosis not present

## 2015-03-15 DIAGNOSIS — C9110 Chronic lymphocytic leukemia of B-cell type not having achieved remission: Secondary | ICD-10-CM | POA: Diagnosis not present

## 2015-03-15 DIAGNOSIS — D509 Iron deficiency anemia, unspecified: Secondary | ICD-10-CM | POA: Diagnosis not present

## 2015-03-15 DIAGNOSIS — E119 Type 2 diabetes mellitus without complications: Secondary | ICD-10-CM | POA: Diagnosis not present

## 2015-03-15 DIAGNOSIS — I480 Paroxysmal atrial fibrillation: Secondary | ICD-10-CM | POA: Diagnosis not present

## 2015-05-05 DIAGNOSIS — Z7982 Long term (current) use of aspirin: Secondary | ICD-10-CM | POA: Diagnosis not present

## 2015-05-05 DIAGNOSIS — I480 Paroxysmal atrial fibrillation: Secondary | ICD-10-CM | POA: Diagnosis not present

## 2015-05-05 DIAGNOSIS — I10 Essential (primary) hypertension: Secondary | ICD-10-CM | POA: Diagnosis not present

## 2015-05-05 DIAGNOSIS — Z79899 Other long term (current) drug therapy: Secondary | ICD-10-CM | POA: Diagnosis not present

## 2015-05-05 DIAGNOSIS — C9110 Chronic lymphocytic leukemia of B-cell type not having achieved remission: Secondary | ICD-10-CM | POA: Diagnosis not present

## 2015-05-08 DIAGNOSIS — E790 Hyperuricemia without signs of inflammatory arthritis and tophaceous disease: Secondary | ICD-10-CM | POA: Diagnosis not present

## 2015-05-08 DIAGNOSIS — L988 Other specified disorders of the skin and subcutaneous tissue: Secondary | ICD-10-CM | POA: Diagnosis not present

## 2015-05-08 DIAGNOSIS — Z79899 Other long term (current) drug therapy: Secondary | ICD-10-CM | POA: Diagnosis not present

## 2015-05-08 DIAGNOSIS — Z7982 Long term (current) use of aspirin: Secondary | ICD-10-CM | POA: Diagnosis not present

## 2015-05-08 DIAGNOSIS — Z9221 Personal history of antineoplastic chemotherapy: Secondary | ICD-10-CM | POA: Diagnosis not present

## 2015-05-08 DIAGNOSIS — D649 Anemia, unspecified: Secondary | ICD-10-CM | POA: Diagnosis not present

## 2015-05-08 DIAGNOSIS — C9110 Chronic lymphocytic leukemia of B-cell type not having achieved remission: Secondary | ICD-10-CM | POA: Diagnosis not present

## 2015-05-08 DIAGNOSIS — I10 Essential (primary) hypertension: Secondary | ICD-10-CM | POA: Diagnosis not present

## 2015-05-08 DIAGNOSIS — M793 Panniculitis, unspecified: Secondary | ICD-10-CM | POA: Diagnosis not present

## 2015-05-08 DIAGNOSIS — I4891 Unspecified atrial fibrillation: Secondary | ICD-10-CM | POA: Diagnosis not present

## 2015-05-08 DIAGNOSIS — I313 Pericardial effusion (noninflammatory): Secondary | ICD-10-CM | POA: Diagnosis not present

## 2015-05-08 DIAGNOSIS — E876 Hypokalemia: Secondary | ICD-10-CM | POA: Diagnosis not present

## 2015-06-14 DIAGNOSIS — E119 Type 2 diabetes mellitus without complications: Secondary | ICD-10-CM | POA: Diagnosis not present

## 2015-06-14 DIAGNOSIS — I10 Essential (primary) hypertension: Secondary | ICD-10-CM | POA: Diagnosis not present

## 2015-06-14 DIAGNOSIS — D509 Iron deficiency anemia, unspecified: Secondary | ICD-10-CM | POA: Diagnosis not present

## 2015-06-14 DIAGNOSIS — E876 Hypokalemia: Secondary | ICD-10-CM | POA: Diagnosis not present

## 2015-06-14 DIAGNOSIS — E782 Mixed hyperlipidemia: Secondary | ICD-10-CM | POA: Diagnosis not present

## 2015-06-19 ENCOUNTER — Encounter (HOSPITAL_COMMUNITY)
Admission: RE | Admit: 2015-06-19 | Discharge: 2015-06-19 | Disposition: A | Discharge status: Home / Self Care | Payer: Medicare Other | Source: Ambulatory Visit | Attending: Oncology | Admitting: Oncology

## 2015-06-19 ENCOUNTER — Encounter (HOSPITAL_COMMUNITY): Payer: Self-pay

## 2015-06-19 DIAGNOSIS — Z452 Encounter for adjustment and management of vascular access device: Secondary | ICD-10-CM | POA: Insufficient documentation

## 2015-06-19 MED ORDER — HEPARIN SOD (PORK) LOCK FLUSH 100 UNIT/ML IV SOLN
INTRAVENOUS | Status: AC
  Filled 2015-06-19: qty 5

## 2015-06-19 MED ORDER — SODIUM CHLORIDE 0.9% FLUSH
10.0000 mL | INTRAVENOUS | Status: AC | PRN
  Administered 2015-06-19: 10 mL

## 2015-06-19 MED ORDER — HEPARIN SOD (PORK) LOCK FLUSH 100 UNIT/ML IV SOLN
500.0000 [IU] | INTRAVENOUS | Status: AC | PRN
  Administered 2015-06-19: 500 [IU]

## 2015-06-19 NOTE — Progress Notes (Signed)
Tim Duncan presented for [facility] cath access and flush. Porta cath located right upper chest wall accessed with  20 gauge needle. Using aspectic technique, blood return noted Portacath flushed with 31ml NS and 500Units Heparin per protocol and needle removed intact. Procedure without incident. Patient tolerated procedure well.  Next appointment 09/18/2015

## 2015-06-20 DIAGNOSIS — I10 Essential (primary) hypertension: Secondary | ICD-10-CM | POA: Diagnosis not present

## 2015-06-20 DIAGNOSIS — N400 Enlarged prostate without lower urinary tract symptoms: Secondary | ICD-10-CM | POA: Diagnosis not present

## 2015-06-20 DIAGNOSIS — I739 Peripheral vascular disease, unspecified: Secondary | ICD-10-CM | POA: Diagnosis not present

## 2015-06-20 DIAGNOSIS — C9110 Chronic lymphocytic leukemia of B-cell type not having achieved remission: Secondary | ICD-10-CM | POA: Diagnosis not present

## 2015-08-07 DIAGNOSIS — C9110 Chronic lymphocytic leukemia of B-cell type not having achieved remission: Secondary | ICD-10-CM | POA: Diagnosis not present

## 2015-09-18 ENCOUNTER — Encounter (HOSPITAL_COMMUNITY)
Admission: RE | Admit: 2015-09-18 | Discharge: 2015-09-18 | Disposition: A | Discharge status: Home / Self Care | Payer: Medicare Other | Source: Ambulatory Visit | Attending: Oncology | Admitting: Oncology

## 2015-09-18 DIAGNOSIS — C9110 Chronic lymphocytic leukemia of B-cell type not having achieved remission: Secondary | ICD-10-CM | POA: Insufficient documentation

## 2015-09-18 MED ORDER — SODIUM CHLORIDE 0.9% FLUSH
10.0000 mL | INTRAVENOUS | Status: AC | PRN
  Administered 2015-09-18: 10 mL

## 2015-09-18 MED ORDER — HEPARIN SOD (PORK) LOCK FLUSH 100 UNIT/ML IV SOLN
500.0000 [IU] | Freq: Once | INTRAVENOUS | Status: AC
  Administered 2015-09-18: 500 [IU] via INTRAVENOUS
  Filled 2015-09-18: qty 5

## 2015-09-18 MED ORDER — HEPARIN SOD (PORK) LOCK FLUSH 100 UNIT/ML IV SOLN
500.0000 [IU] | INTRAVENOUS | Status: AC | PRN
  Administered 2015-09-18: 500 [IU]

## 2015-09-18 MED ORDER — HEPARIN SOD (PORK) LOCK FLUSH 100 UNIT/ML IV SOLN
INTRAVENOUS | Status: AC
  Filled 2015-09-18: qty 5

## 2015-09-18 NOTE — Progress Notes (Signed)
Patient presented for [facility] a Cath flush in [facility]. Patient has dual port- RN able to access rt side of port with good blood return and flushed without resistance. Accessed left side of port x 3 with no blood return upon aspiration, easily flushed.

## 2015-09-25 ENCOUNTER — Encounter (INDEPENDENT_AMBULATORY_CARE_PROVIDER_SITE_OTHER): Payer: Self-pay | Admitting: *Deleted

## 2015-10-17 ENCOUNTER — Encounter (INDEPENDENT_AMBULATORY_CARE_PROVIDER_SITE_OTHER): Payer: Self-pay | Admitting: *Deleted

## 2015-10-18 ENCOUNTER — Other Ambulatory Visit (INDEPENDENT_AMBULATORY_CARE_PROVIDER_SITE_OTHER): Payer: Self-pay | Admitting: *Deleted

## 2015-10-18 DIAGNOSIS — Z1211 Encounter for screening for malignant neoplasm of colon: Secondary | ICD-10-CM

## 2015-12-08 ENCOUNTER — Encounter (INDEPENDENT_AMBULATORY_CARE_PROVIDER_SITE_OTHER): Payer: Self-pay | Admitting: *Deleted

## 2015-12-08 ENCOUNTER — Telehealth (INDEPENDENT_AMBULATORY_CARE_PROVIDER_SITE_OTHER): Payer: Self-pay | Admitting: *Deleted

## 2015-12-08 MED ORDER — PEG 3350-KCL-NA BICARB-NACL 420 G PO SOLR: 4000.0000 mL | Freq: Once | ORAL | 0 refills | Status: AC

## 2015-12-08 NOTE — Telephone Encounter (Signed)
Patient need trilyte 

## 2015-12-25 ENCOUNTER — Inpatient Hospital Stay (HOSPITAL_COMMUNITY): Admission: RE | Admit: 2015-12-25 | Payer: Medicare Other | Source: Ambulatory Visit

## 2015-12-28 ENCOUNTER — Telehealth (INDEPENDENT_AMBULATORY_CARE_PROVIDER_SITE_OTHER): Payer: Self-pay | Admitting: *Deleted

## 2015-12-28 NOTE — Telephone Encounter (Signed)
Referring MD/PCP: hall   Procedure: tcs  Reason/Indication:  screening  Has patient had this procedure before?  no  If so, when, by whom and where?    Is there a family history of colon cancer?  no  Who?  What age when diagnosed?    Is patient diabetic?   no      Does patient have prosthetic heart valve or mechanical valve?  no  Do you have a pacemaker?  no  Has patient ever had endocarditis? no  Has patient had joint replacement within last 12 months?  no  Does patient tend to be constipated or take laxatives? no  Does patient have a history of alcohol/drug use?  no  Is patient on Coumadin, Plavix and/or Aspirin? no  Medications: imbruvica 140 mg 3 tab tid, allopurinol 300 mg daily, valacyclovir 500 mg daily, omega 3 daily, hctz 25 mg daily, metoprolol 50 mg daily, amlodipine 10 mg daily, irbesartan 300 mg daily, tamsulosin 0.4 mg daily  Allergies: nkda  Medication Adjustment:   Procedure date & time: 01/24/16 at 1130

## 2015-12-28 NOTE — Telephone Encounter (Signed)
agree

## 2016-01-24 ENCOUNTER — Ambulatory Visit (HOSPITAL_COMMUNITY): Admission: RE | Admit: 2016-01-24 | Payer: Medicare Other | Source: Ambulatory Visit | Admitting: Internal Medicine

## 2016-01-24 ENCOUNTER — Encounter (HOSPITAL_COMMUNITY): Admission: RE | Payer: Self-pay | Source: Ambulatory Visit

## 2016-01-24 SURGERY — COLONOSCOPY
Anesthesia: Moderate Sedation

## 2016-02-02 ENCOUNTER — Other Ambulatory Visit (HOSPITAL_COMMUNITY): Payer: Self-pay | Admitting: *Deleted

## 2016-02-05 ENCOUNTER — Other Ambulatory Visit (HOSPITAL_COMMUNITY): Payer: Self-pay | Admitting: *Deleted

## 2016-02-05 ENCOUNTER — Encounter (HOSPITAL_COMMUNITY)
Admission: RE | Admit: 2016-02-05 | Discharge: 2016-02-05 | Disposition: A | Discharge status: Home / Self Care | Payer: Medicare Other | Source: Ambulatory Visit | Attending: Oncology | Admitting: Oncology

## 2016-02-05 DIAGNOSIS — Z452 Encounter for adjustment and management of vascular access device: Secondary | ICD-10-CM | POA: Insufficient documentation

## 2016-02-05 LAB — CBC WITH DIFFERENTIAL/PLATELET
Band Neutrophils: 0 %
Basophils Absolute: 0 10*3/uL (ref 0.0–0.1)
Basophils Relative: 0 %
Blasts: 0 %
Eosinophils Absolute: 1.4 10*3/uL — ABNORMAL HIGH (ref 0.0–0.7)
Eosinophils Relative: 2 %
HCT: 40.9 % (ref 39.0–52.0)
Hemoglobin: 12.5 g/dL — ABNORMAL LOW (ref 13.0–17.0)
Lymphocytes Relative: 86 %
Lymphs Abs: 62.1 10*3/uL — ABNORMAL HIGH (ref 0.7–4.0)
MCH: 30.6 pg (ref 26.0–34.0)
MCHC: 30.6 g/dL (ref 30.0–36.0)
MCV: 100 fL (ref 78.0–100.0)
Metamyelocytes Relative: 0 %
Monocytes Absolute: 1.4 10*3/uL — ABNORMAL HIGH (ref 0.1–1.0)
Monocytes Relative: 2 %
Myelocytes: 0 %
Neutro Abs: 7.2 10*3/uL (ref 1.7–7.7)
Neutrophils Relative %: 10 %
Platelets: 194 10*3/uL (ref 150–400)
Promyelocytes Absolute: 0 %
RBC: 4.09 MIL/uL — ABNORMAL LOW (ref 4.22–5.81)
RDW: 15.3 % (ref 11.5–15.5)
WBC: 72.1 10*3/uL (ref 4.0–10.5)
nRBC: 0 /100 WBC

## 2016-02-05 MED ORDER — HEPARIN SOD (PORK) LOCK FLUSH 100 UNIT/ML IV SOLN
500.0000 [IU] | INTRAVENOUS | Status: DC | PRN
  Filled 2016-02-05: qty 5

## 2016-02-05 MED ORDER — SODIUM CHLORIDE 0.9% FLUSH
10.0000 mL | INTRAVENOUS | Status: DC | PRN
Start: 2016-02-05 — End: 2016-02-06
  Filled 2016-02-05: qty 10

## 2016-02-05 NOTE — Progress Notes (Signed)
Critical wbc results faxed and called to Dr Lissa Merlin office. Wbc 72.1.

## 2016-02-26 ENCOUNTER — Encounter (HOSPITAL_COMMUNITY)
Admission: RE | Admit: 2016-02-26 | Discharge: 2016-02-26 | Disposition: A | Discharge status: Home / Self Care | Payer: Medicare Other | Source: Ambulatory Visit | Attending: Oncology | Admitting: Oncology

## 2016-05-14 DIAGNOSIS — R7881 Bacteremia: Secondary | ICD-10-CM | POA: Insufficient documentation

## 2016-05-20 ENCOUNTER — Encounter (HOSPITAL_COMMUNITY): Payer: Medicare Other

## 2016-05-22 ENCOUNTER — Encounter (HOSPITAL_COMMUNITY)
Admission: RE | Admit: 2016-05-22 | Discharge: 2016-05-22 | Disposition: A | Discharge status: Home / Self Care | Payer: Medicare Other | Source: Ambulatory Visit | Attending: Oncology | Admitting: Oncology

## 2016-05-22 ENCOUNTER — Encounter (HOSPITAL_COMMUNITY): Payer: Self-pay

## 2016-05-22 DIAGNOSIS — C9110 Chronic lymphocytic leukemia of B-cell type not having achieved remission: Secondary | ICD-10-CM | POA: Diagnosis present

## 2016-05-22 MED ORDER — HEPARIN SOD (PORK) LOCK FLUSH 100 UNIT/ML IV SOLN
INTRAVENOUS | Status: AC
  Filled 2016-05-22: qty 5

## 2016-05-22 MED ORDER — HEPARIN SOD (PORK) LOCK FLUSH 100 UNIT/ML IV SOLN
500.0000 [IU] | INTRAVENOUS | Status: AC | PRN
  Administered 2016-05-22: 500 [IU]

## 2016-05-22 MED ORDER — SODIUM CHLORIDE 0.9% FLUSH
10.0000 mL | INTRAVENOUS | Status: AC | PRN
  Administered 2016-05-22: 10 mL

## 2016-05-22 NOTE — Progress Notes (Signed)
Patient came in today for staff to obtain blood cultures from his porta cath that has dual lumens also peripheral sample as well. Blood obtained from the left side of port , but was unable to get blood return from the right side with multiple attempts. Peripheral and left sided samples sent to the lab.

## 2016-05-23 NOTE — Final Progress Note (Signed)
Status:  Preliminary result Visible to patient:  No (Not Released) Next appt:  None   1d ago  Specimen Description RIGHT ANTECUBITAL DRAWN BY RN   Special Requests BOTTLES DRAWN AEROBIC AND ANAEROBIC 6CC   Culture NO GROWTH < 24 HOURS   Report Status PENDING   Resulting Agency [id number]    Specimen Collected: 05/22/16 14:11 Last Resulted: 05/23/16 12:02                  Other Results from 05/22/2016   Blood culture  Order: [id number]   Status:  Preliminary result Visible to patient:  No (Not Released) Next appt:  None  Newer results are available. Click to view them now.   1d ago  Specimen Description PORTA CATH DRAWN BY RN   Special Requests BOTTLES DRAWN AEROBIC AND ANAEROBIC 6CC   Culture GRAM NEGATIVE RODS  Gram Stain Report Called to,Read Back By and Verified With: LIVENGOOD K. AT [place] [id number] ON [id number] BY THOMPSON S.      Report Status PENDING

## 2016-05-25 LAB — CULTURE, BLOOD (SINGLE)

## 2016-05-27 ENCOUNTER — Other Ambulatory Visit (HOSPITAL_COMMUNITY)
Admission: AD | Admit: 2016-05-27 | Discharge: 2016-05-27 | Disposition: A | Discharge status: Home / Self Care | Payer: Medicare Other | Source: Skilled Nursing Facility | Attending: Oncology | Admitting: Oncology

## 2016-05-27 DIAGNOSIS — R7881 Bacteremia: Secondary | ICD-10-CM | POA: Diagnosis present

## 2016-05-27 LAB — CBC WITH DIFFERENTIAL/PLATELET
Band Neutrophils: 2 %
Basophils Absolute: 1.5 10*3/uL — ABNORMAL HIGH (ref 0.0–0.1)
Basophils Relative: 1 %
Eosinophils Absolute: 0 10*3/uL (ref 0.0–0.7)
Eosinophils Relative: 0 %
HCT: 35.5 % — ABNORMAL LOW (ref 39.0–52.0)
Hemoglobin: 10.7 g/dL — ABNORMAL LOW (ref 13.0–17.0)
Lymphocytes Relative: 82 %
Lymphs Abs: 124 10*3/uL — ABNORMAL HIGH (ref 0.7–4.0)
MCH: 29.6 pg (ref 26.0–34.0)
MCHC: 30.1 g/dL (ref 30.0–36.0)
MCV: 98.3 fL (ref 78.0–100.0)
Metamyelocytes Relative: 1 %
Monocytes Absolute: 0 10*3/uL — ABNORMAL LOW (ref 0.1–1.0)
Monocytes Relative: 0 %
Neutro Abs: 25.7 10*3/uL — ABNORMAL HIGH (ref 1.7–7.7)
Neutrophils Relative %: 14 %
Platelets: 204 10*3/uL (ref 150–400)
RBC: 3.61 MIL/uL — ABNORMAL LOW (ref 4.22–5.81)
RDW: 17.2 % — ABNORMAL HIGH (ref 11.5–15.5)
WBC: 151.2 10*3/uL (ref 4.0–10.5)

## 2016-05-27 LAB — BASIC METABOLIC PANEL
Anion gap: 9 (ref 5–15)
BUN: 13 mg/dL (ref 6–20)
CO2: 26 mmol/L (ref 22–32)
Calcium: 7.8 mg/dL — ABNORMAL LOW (ref 8.9–10.3)
Chloride: 104 mmol/L (ref 101–111)
Creatinine, Ser: 1.09 mg/dL (ref 0.61–1.24)
GFR calc Af Amer: >60 mL/min (ref >60)
GFR calc non Af Amer: >60 mL/min (ref >60)
Glucose, Bld: 120 mg/dL — ABNORMAL HIGH (ref 65–99)
Potassium: 3.9 mmol/L (ref 3.5–5.1)
Sodium: 139 mmol/L (ref 135–145)

## 2016-05-27 LAB — CULTURE, BLOOD (SINGLE): Culture: NO GROWTH

## 2016-05-31 ENCOUNTER — Encounter (HOSPITAL_COMMUNITY)
Admission: RE | Admit: 2016-05-31 | Discharge: 2016-05-31 | Disposition: A | Discharge status: Home / Self Care | Payer: Medicare Other | Source: Ambulatory Visit | Attending: Oncology | Admitting: Oncology

## 2016-05-31 ENCOUNTER — Encounter (HOSPITAL_COMMUNITY): Payer: Self-pay

## 2016-05-31 DIAGNOSIS — Z79899 Other long term (current) drug therapy: Secondary | ICD-10-CM | POA: Insufficient documentation

## 2016-05-31 DIAGNOSIS — Z5181 Encounter for therapeutic drug level monitoring: Secondary | ICD-10-CM | POA: Diagnosis present

## 2016-05-31 MED ORDER — SODIUM CHLORIDE 0.9% FLUSH: 10.0000 mL | INTRAVENOUS | Status: DC | PRN

## 2016-05-31 MED ORDER — HEPARIN SOD (PORK) LOCK FLUSH 100 UNIT/ML IV SOLN
250.0000 [IU] | INTRAVENOUS | Status: AC | PRN
  Administered 2016-05-31: 250 [IU]

## 2016-05-31 MED ORDER — HEPARIN SOD (PORK) LOCK FLUSH 100 UNIT/ML IV SOLN
INTRAVENOUS | Status: AC
  Filled 2016-05-31: qty 5

## 2016-05-31 NOTE — Progress Notes (Addendum)
Blood cultures taken today from left wrist peripheral x 2 and from right upper arm picc line x2, using sterile technique. PICC line flushed with saline and heparin after completion of blood cultures. Pending results.

## 2016-06-03 ENCOUNTER — Other Ambulatory Visit (HOSPITAL_COMMUNITY)
Admission: RE | Admit: 2016-06-03 | Discharge: 2016-06-03 | Disposition: A | Discharge status: Home / Self Care | Payer: Medicare Other | Source: Other Acute Inpatient Hospital | Attending: Oncology | Admitting: Oncology

## 2016-06-03 DIAGNOSIS — Z5181 Encounter for therapeutic drug level monitoring: Secondary | ICD-10-CM | POA: Diagnosis present

## 2016-06-03 LAB — CBC WITH DIFFERENTIAL/PLATELET
HCT: 35.6 % — ABNORMAL LOW (ref 39.0–52.0)
Hemoglobin: 10.5 g/dL — ABNORMAL LOW (ref 13.0–17.0)
Lymphocytes Relative: 89 %
MCH: 29.3 pg (ref 26.0–34.0)
MCHC: 29.5 g/dL — ABNORMAL LOW (ref 30.0–36.0)
MCV: 99.4 fL (ref 78.0–100.0)
Monocytes Relative: 1 %
Neutrophils Relative %: 10 %
Platelets: 174 10*3/uL (ref 150–400)
RBC: 3.58 MIL/uL — ABNORMAL LOW (ref 4.22–5.81)
RDW: 16.9 % — ABNORMAL HIGH (ref 11.5–15.5)
WBC: 122.8 10*3/uL (ref 4.0–10.5)
nRBC: 0 /100 WBC

## 2016-06-03 LAB — BASIC METABOLIC PANEL
Anion gap: 7 (ref 5–15)
BUN: 15 mg/dL (ref 6–20)
CO2: 27 mmol/L (ref 22–32)
Calcium: 8.5 mg/dL — ABNORMAL LOW (ref 8.9–10.3)
Chloride: 103 mmol/L (ref 101–111)
Creatinine, Ser: 1.01 mg/dL (ref 0.61–1.24)
GFR calc Af Amer: >60 mL/min (ref >60)
GFR calc non Af Amer: >60 mL/min (ref >60)
Glucose, Bld: 151 mg/dL — ABNORMAL HIGH (ref 65–99)
Potassium: 3.6 mmol/L (ref 3.5–5.1)
Sodium: 137 mmol/L (ref 135–145)

## 2016-06-05 LAB — CULTURE, BLOOD (ROUTINE X 2)
Culture: NO GROWTH
Culture: NO GROWTH

## 2016-06-10 ENCOUNTER — Other Ambulatory Visit (HOSPITAL_COMMUNITY)
Admission: RE | Admit: 2016-06-10 | Discharge: 2016-06-10 | Disposition: A | Discharge status: Home / Self Care | Payer: Medicare Other | Source: Other Acute Inpatient Hospital | Attending: Oncology | Admitting: Oncology

## 2016-06-10 DIAGNOSIS — Z5181 Encounter for therapeutic drug level monitoring: Secondary | ICD-10-CM | POA: Diagnosis present

## 2016-06-10 LAB — CBC WITH DIFFERENTIAL/PLATELET
Basophils Absolute: 0 10*3/uL (ref 0.0–0.1)
Basophils Relative: 0 %
Eosinophils Absolute: 3.9 10*3/uL — ABNORMAL HIGH (ref 0.0–0.7)
Eosinophils Relative: 2 %
HCT: 35.6 % — ABNORMAL LOW (ref 39.0–52.0)
Hemoglobin: 10.9 g/dL — ABNORMAL LOW (ref 13.0–17.0)
Lymphocytes Relative: 89 %
Lymphs Abs: 172.6 10*3/uL — ABNORMAL HIGH (ref 0.7–4.0)
MCH: 30.1 pg (ref 26.0–34.0)
MCHC: 30.6 g/dL (ref 30.0–36.0)
MCV: 98.3 fL (ref 78.0–100.0)
Monocytes Absolute: 3.9 10*3/uL — ABNORMAL HIGH (ref 0.1–1.0)
Monocytes Relative: 2 %
Neutro Abs: 13.6 10*3/uL — ABNORMAL HIGH (ref 1.7–7.7)
Neutrophils Relative %: 7 %
Platelets: 176 10*3/uL (ref 150–400)
RBC: 3.62 MIL/uL — ABNORMAL LOW (ref 4.22–5.81)
RDW: 17 % — ABNORMAL HIGH (ref 11.5–15.5)
WBC: 194 10*3/uL (ref 4.0–10.5)

## 2016-06-10 LAB — BASIC METABOLIC PANEL
Anion gap: 9 (ref 5–15)
BUN: 18 mg/dL (ref 6–20)
CO2: 27 mmol/L (ref 22–32)
Calcium: 9 mg/dL (ref 8.9–10.3)
Chloride: 100 mmol/L — ABNORMAL LOW (ref 101–111)
Creatinine, Ser: 0.96 mg/dL (ref 0.61–1.24)
GFR calc Af Amer: >60 mL/min (ref >60)
GFR calc non Af Amer: >60 mL/min (ref >60)
Glucose, Bld: 105 mg/dL — ABNORMAL HIGH (ref 65–99)
Potassium: 4.4 mmol/L (ref 3.5–5.1)
Sodium: 136 mmol/L (ref 135–145)

## 2016-06-12 NOTE — Progress Notes (Signed)
Results for Tim Duncan, Tim Duncan (MRN [id number]) as of 06/12/2016 15:54  Ref. Range 05/31/2016 09:12 05/31/2016 09:30  CULTURE, BLOOD (ROUTINE X 2) W REFLEX TO ID PANEL Unknown Rpt Rpt

## 2016-06-18 ENCOUNTER — Encounter (HOSPITAL_COMMUNITY)
Admission: RE | Admit: 2016-06-18 | Discharge: 2016-06-18 | Disposition: A | Discharge status: Home / Self Care | Payer: Medicare Other | Source: Ambulatory Visit | Attending: Gastroenterology | Admitting: Gastroenterology

## 2016-06-18 ENCOUNTER — Encounter (HOSPITAL_COMMUNITY): Payer: Self-pay

## 2016-06-18 DIAGNOSIS — Z5181 Encounter for therapeutic drug level monitoring: Secondary | ICD-10-CM | POA: Diagnosis not present

## 2016-06-18 LAB — CBC WITH DIFFERENTIAL/PLATELET
Band Neutrophils: 0 %
Basophils Absolute: 0 10*3/uL (ref 0.0–0.1)
Basophils Relative: 0 %
Blasts: 0 %
Eosinophils Absolute: 0 10*3/uL (ref 0.0–0.7)
Eosinophils Relative: 0 %
HCT: 37.2 % — ABNORMAL LOW (ref 39.0–52.0)
Hemoglobin: 11.5 g/dL — ABNORMAL LOW (ref 13.0–17.0)
Lymphocytes Relative: 95 %
Lymphs Abs: 259.9 10*3/uL — ABNORMAL HIGH (ref 0.7–4.0)
MCH: 30.1 pg (ref 26.0–34.0)
MCHC: 30.9 g/dL (ref 30.0–36.0)
MCV: 97.4 fL (ref 78.0–100.0)
Metamyelocytes Relative: 0 %
Monocytes Absolute: 0 10*3/uL — ABNORMAL LOW (ref 0.1–1.0)
Monocytes Relative: 0 %
Myelocytes: 0 %
Neutro Abs: 13.7 10*3/uL — ABNORMAL HIGH (ref 1.7–7.7)
Neutrophils Relative %: 5 %
Other: 0 %
Platelets: 243 10*3/uL (ref 150–400)
Promyelocytes Absolute: 0 %
RBC: 3.82 MIL/uL — ABNORMAL LOW (ref 4.22–5.81)
RDW: 16.8 % — ABNORMAL HIGH (ref 11.5–15.5)
WBC: 273.6 10*3/uL (ref 4.0–10.5)
nRBC: 0 /100 WBC

## 2016-06-19 NOTE — Progress Notes (Signed)
Tim Duncan, These labs were placed on this patient under my name. Looks like it was done in short stay. I have no idea who this should have went to. Can you help?

## 2016-06-19 NOTE — Progress Notes (Signed)
Results for BOE, DEANS (MRN [id number]) as of 06/19/2016 13:59  Ref. Range 06/18/2016 13:22  WBC Latest Ref Range: 4.0 - 10.5 K/uL 273.6 (HH)  RBC Latest Ref Range: 4.22 - 5.81 MIL/uL 3.82 (L)  Hemoglobin Latest Ref Range: 13.0 - 17.0 g/dL 11.5 (L)  HCT Latest Ref Range: 39.0 - 52.0 % 37.2 (L)  MCV Latest Ref Range: 78.0 - 100.0 fL 97.4  MCH Latest Ref Range: 26.0 - 34.0 pg 30.1  MCHC Latest Ref Range: 30.0 - 36.0 g/dL 30.9  RDW Latest Ref Range: 11.5 - 15.5 % 16.8 (H)  Platelets Latest Ref Range: 150 - 400 K/uL 243  nRBC Latest Ref Range: 0 /100 WBC 0  Neutrophils Latest Units: % 5  Lymphocytes Latest Units: % 95  Monocytes Relative Latest Units: % 0  Eosinophil Latest Units: % 0  Basophil Latest Units: % 0  NEUT# Latest Ref Range: 1.7 - 7.7 K/uL 13.7 (H)  Lymphocyte # Latest Ref Range: 0.7 - 4.0 K/uL 259.9 (H)  Monocyte # Latest Ref Range: 0.1 - 1.0 K/uL 0.0 (L)  Eosinophils Absolute Latest Ref Range: 0.0 - 0.7 K/uL 0.0  Basophils Absolute Latest Ref Range: 0.0 - 0.1 K/uL 0.0  WBC Morphology Unknown ATYPICAL LYMPHOCYTES  Myelocytes Latest Units: % 0  Metamyelocytes Relative Latest Units: % 0  Promyelocytes Absolute Latest Units: % 0  Blasts Latest Units: % 0  Band Neutrophils Latest Units: % 0  Other Latest Units: % 0

## 2016-06-20 LAB — PATHOLOGIST SMEAR REVIEW

## 2016-06-20 NOTE — Progress Notes (Signed)
Results for SOPHIE, QUILES (MRN [id number]) as of 06/20/2016 12:23  Ref. Range 06/18/2016 13:22  WBC Latest Ref Range: 4.0 - 10.5 K/uL 273.6 (HH)  RBC Latest Ref Range: 4.22 - 5.81 MIL/uL 3.82 (L)  Hemoglobin Latest Ref Range: 13.0 - 17.0 g/dL 11.5 (L)  HCT Latest Ref Range: 39.0 - 52.0 % 37.2 (L)  MCV Latest Ref Range: 78.0 - 100.0 fL 97.4  MCH Latest Ref Range: 26.0 - 34.0 pg 30.1  MCHC Latest Ref Range: 30.0 - 36.0 g/dL 30.9  RDW Latest Ref Range: 11.5 - 15.5 % 16.8 (H)  Platelets Latest Ref Range: 150 - 400 K/uL 243  nRBC Latest Ref Range: 0 /100 WBC 0  Neutrophils Latest Units: % 5  Lymphocytes Latest Units: % 95  Monocytes Relative Latest Units: % 0  Eosinophil Latest Units: % 0  Basophil Latest Units: % 0  NEUT# Latest Ref Range: 1.7 - 7.7 K/uL 13.7 (H)  Lymphocyte # Latest Ref Range: 0.7 - 4.0 K/uL 259.9 (H)  Monocyte # Latest Ref Range: 0.1 - 1.0 K/uL 0.0 (L)  Eosinophils Absolute Latest Ref Range: 0.0 - 0.7 K/uL 0.0  Basophils Absolute Latest Ref Range: 0.0 - 0.1 K/uL 0.0  WBC Morphology Unknown ATYPICAL LYMPHOCYTES  Myelocytes Latest Units: % 0  Metamyelocytes Relative Latest Units: % 0  Promyelocytes Absolute Latest Units: % 0  Blasts Latest Units: % 0  Band Neutrophils Latest Units: % 0  Other Latest Units: % 0  Path Review Unknown Reviewed by Gregary Signs.Marland KitchenMarland Kitchen

## 2016-06-20 NOTE — Progress Notes (Signed)
Results for Tim Duncan, Tim Duncan (MRN [id number]) as of 06/20/2016 12:28  Ref. Range 06/10/2016 10:13 06/18/2016 13:22  Creatinine Latest Ref Range: 0.61 - 1.24 mg/dL 0.96   Calcium Latest Ref Range: 8.9 - 10.3 mg/dL 9.0   Anion gap Latest Ref Range: 5 - 15  9   EGFR (African American) Latest Ref Range: >60 mL/min >60   EGFR (Non-African Amer.) Latest Ref Range: >60 mL/min >60   WBC Latest Ref Range: 4.0 - 10.5 K/uL 194.0 (HH) 273.6 (HH)  RBC Latest Ref Range: 4.22 - 5.81 MIL/uL 3.62 (L) 3.82 (L)  Hemoglobin Latest Ref Range: 13.0 - 17.0 g/dL 10.9 (L) 11.5 (L)  HCT Latest Ref Range: 39.0 - 52.0 % 35.6 (L) 37.2 (L)  MCV Latest Ref Range: 78.0 - 100.0 fL 98.3 97.4  MCH Latest Ref Range: 26.0 - 34.0 pg 30.1 30.1  MCHC Latest Ref Range: 30.0 - 36.0 g/dL 30.6 30.9  RDW Latest Ref Range: 11.5 - 15.5 % 17.0 (H) 16.8 (H)  Platelets Latest Ref Range: 150 - 400 K/uL 176 243  nRBC Latest Ref Range: 0 /100 WBC  0  Neutrophils Latest Units: % 7 5  Lymphocytes Latest Units: % 89 95  Monocytes Relative Latest Units: % 2 0  Eosinophil Latest Units: % 2 0  Basophil Latest Units: % 0 0  NEUT# Latest Ref Range: 1.7 - 7.7 K/uL 13.6 (H) 13.7 (H)  Lymphocyte # Latest Ref Range: 0.7 - 4.0 K/uL 172.6 (H) 259.9 (H)  Monocyte # Latest Ref Range: 0.1 - 1.0 K/uL 3.9 (H) 0.0 (L)  Eosinophils Absolute Latest Ref Range: 0.0 - 0.7 K/uL 3.9 (H) 0.0  Basophils Absolute Latest Ref Range: 0.0 - 0.1 K/uL 0.0 0.0  WBC Morphology Unknown ATYPICAL LYMPHOCYTES ATYPICAL LYMPHOCYTES  Myelocytes Latest Units: %  0  Metamyelocytes Relative Latest Units: %  0  Promyelocytes Absolute Latest Units: %  0  Blasts Latest Units: %  0  Band Neutrophils Latest Units: %  0  Other Latest Units: %  0  Glucose Latest Ref Range: 65 - 99 mg/dL 105 (H)   Path Review Unknown  Reviewed by Gregary Signs.Marland KitchenMarland Kitchen

## 2016-06-20 NOTE — Progress Notes (Signed)
I was notified that the correct provider has been notified of results.

## 2016-06-20 NOTE — Progress Notes (Signed)
Results for KADIR, AZUCENA (MRN [id number]) as of 06/20/2016 12:15  Ref. Range 06/18/2016 13:22  WBC Latest Ref Range: 4.0 - 10.5 K/uL 273.6 (HH)  RBC Latest Ref Range: 4.22 - 5.81 MIL/uL 3.82 (L)  Hemoglobin Latest Ref Range: 13.0 - 17.0 g/dL 11.5 (L)  HCT Latest Ref Range: 39.0 - 52.0 % 37.2 (L)  MCV Latest Ref Range: 78.0 - 100.0 fL 97.4  MCH Latest Ref Range: 26.0 - 34.0 pg 30.1  MCHC Latest Ref Range: 30.0 - 36.0 g/dL 30.9  RDW Latest Ref Range: 11.5 - 15.5 % 16.8 (H)  Platelets Latest Ref Range: 150 - 400 K/uL 243  nRBC Latest Ref Range: 0 /100 WBC 0  Neutrophils Latest Units: % 5  Lymphocytes Latest Units: % 95  Monocytes Relative Latest Units: % 0  Eosinophil Latest Units: % 0  Basophil Latest Units: % 0  NEUT# Latest Ref Range: 1.7 - 7.7 K/uL 13.7 (H)  Lymphocyte # Latest Ref Range: 0.7 - 4.0 K/uL 259.9 (H)  Monocyte # Latest Ref Range: 0.1 - 1.0 K/uL 0.0 (L)  Eosinophils Absolute Latest Ref Range: 0.0 - 0.7 K/uL 0.0  Basophils Absolute Latest Ref Range: 0.0 - 0.1 K/uL 0.0  WBC Morphology Unknown ATYPICAL LYMPHOCYTES  Myelocytes Latest Units: % 0  Metamyelocytes Relative Latest Units: % 0  Promyelocytes Absolute Latest Units: % 0  Blasts Latest Units: % 0  Band Neutrophils Latest Units: % 0  Other Latest Units: % 0  Path Review Unknown Reviewed by Gregary Signs.Marland KitchenMarland Kitchen

## 2016-06-20 NOTE — Progress Notes (Signed)
06/20/16-1255-talked with Neil Crouch re:her name on Mr Liz chart and lab results being sent to her. Investigated situation and correction made with registration and her name was removed from chart. Lab results sent to correct physicians. Voiced understanding.

## 2016-06-20 NOTE — Progress Notes (Signed)
Results for Tim Duncan, Tim Duncan (MRN [id number]) as of 06/20/2016 12:28  Ref. Range 06/18/2016 13:22  WBC Latest Ref Range: 4.0 - 10.5 K/uL 273.6 (HH)  RBC Latest Ref Range: 4.22 - 5.81 MIL/uL 3.82 (L)  Hemoglobin Latest Ref Range: 13.0 - 17.0 g/dL 11.5 (L)  HCT Latest Ref Range: 39.0 - 52.0 % 37.2 (L)  MCV Latest Ref Range: 78.0 - 100.0 fL 97.4  MCH Latest Ref Range: 26.0 - 34.0 pg 30.1  MCHC Latest Ref Range: 30.0 - 36.0 g/dL 30.9  RDW Latest Ref Range: 11.5 - 15.5 % 16.8 (H)  Platelets Latest Ref Range: 150 - 400 K/uL 243  nRBC Latest Ref Range: 0 /100 WBC 0  Neutrophils Latest Units: % 5  Lymphocytes Latest Units: % 95  Monocytes Relative Latest Units: % 0  Eosinophil Latest Units: % 0  Basophil Latest Units: % 0  NEUT# Latest Ref Range: 1.7 - 7.7 K/uL 13.7 (H)  Lymphocyte # Latest Ref Range: 0.7 - 4.0 K/uL 259.9 (H)  Monocyte # Latest Ref Range: 0.1 - 1.0 K/uL 0.0 (L)  Eosinophils Absolute Latest Ref Range: 0.0 - 0.7 K/uL 0.0  Basophils Absolute Latest Ref Range: 0.0 - 0.1 K/uL 0.0  WBC Morphology Unknown ATYPICAL LYMPHOCYTES  Myelocytes Latest Units: % 0  Metamyelocytes Relative Latest Units: % 0  Promyelocytes Absolute Latest Units: % 0  Blasts Latest Units: % 0  Band Neutrophils Latest Units: % 0  Other Latest Units: % 0

## 2016-06-20 NOTE — Progress Notes (Signed)
Results for Tim Duncan, Tim Duncan (MRN [id number]) as of 06/20/2016 12:41  Ref. Range 06/18/2016 13:22  WBC Latest Ref Range: 4.0 - 10.5 K/uL 273.6 (HH)  RBC Latest Ref Range: 4.22 - 5.81 MIL/uL 3.82 (L)  Hemoglobin Latest Ref Range: 13.0 - 17.0 g/dL 11.5 (L)  HCT Latest Ref Range: 39.0 - 52.0 % 37.2 (L)  MCV Latest Ref Range: 78.0 - 100.0 fL 97.4  MCH Latest Ref Range: 26.0 - 34.0 pg 30.1  MCHC Latest Ref Range: 30.0 - 36.0 g/dL 30.9  RDW Latest Ref Range: 11.5 - 15.5 % 16.8 (H)  Platelets Latest Ref Range: 150 - 400 K/uL 243  nRBC Latest Ref Range: 0 /100 WBC 0  Neutrophils Latest Units: % 5  Lymphocytes Latest Units: % 95  Monocytes Relative Latest Units: % 0  Eosinophil Latest Units: % 0  Basophil Latest Units: % 0  NEUT# Latest Ref Range: 1.7 - 7.7 K/uL 13.7 (H)  Lymphocyte # Latest Ref Range: 0.7 - 4.0 K/uL 259.9 (H)  Monocyte # Latest Ref Range: 0.1 - 1.0 K/uL 0.0 (L)  Eosinophils Absolute Latest Ref Range: 0.0 - 0.7 K/uL 0.0  Basophils Absolute Latest Ref Range: 0.0 - 0.1 K/uL 0.0  WBC Morphology Unknown ATYPICAL LYMPHOCYTES  Myelocytes Latest Units: % 0  Metamyelocytes Relative Latest Units: % 0  Promyelocytes Absolute Latest Units: % 0  Blasts Latest Units: % 0  Band Neutrophils Latest Units: % 0  Other Latest Units: % 0  Path Review Unknown Reviewed by Gregary Signs.Marland KitchenMarland Kitchen

## 2016-06-20 NOTE — Progress Notes (Signed)
I spoke to Abbie Sons RN and she is looking into this and making sure appropriate provider gets these results. She will call me back when resolved.

## 2016-07-29 ENCOUNTER — Encounter (HOSPITAL_COMMUNITY)
Admission: RE | Admit: 2016-07-29 | Discharge: 2016-07-29 | Disposition: A | Discharge status: Home / Self Care | Payer: Medicare Other | Source: Ambulatory Visit | Attending: Oncology | Admitting: Oncology

## 2016-07-29 DIAGNOSIS — Z01812 Encounter for preprocedural laboratory examination: Secondary | ICD-10-CM | POA: Diagnosis not present

## 2016-07-29 DIAGNOSIS — C9190 Lymphoid leukemia, unspecified not having achieved remission: Secondary | ICD-10-CM | POA: Diagnosis present

## 2016-07-29 LAB — CBC WITH DIFFERENTIAL/PLATELET
Basophils Absolute: 0 10*3/uL (ref 0.0–0.1)
Basophils Relative: 0 %
Eosinophils Absolute: 0 10*3/uL (ref 0.0–0.7)
Eosinophils Relative: 0 %
HCT: 36.8 % — ABNORMAL LOW (ref 39.0–52.0)
Hemoglobin: 10.6 g/dL — ABNORMAL LOW (ref 13.0–17.0)
Lymphocytes Relative: 89 %
Lymphs Abs: 290 10*3/uL — ABNORMAL HIGH (ref 0.7–4.0)
MCH: 30 pg (ref 26.0–34.0)
MCHC: 28.8 g/dL — ABNORMAL LOW (ref 30.0–36.0)
MCV: 104.2 fL — ABNORMAL HIGH (ref 78.0–100.0)
Monocytes Absolute: 3.3 10*3/uL — ABNORMAL HIGH (ref 0.1–1.0)
Monocytes Relative: 1 %
Neutro Abs: 32.6 10*3/uL — ABNORMAL HIGH (ref 1.7–7.7)
Neutrophils Relative %: 10 %
Platelets: 211 10*3/uL (ref 150–400)
RBC: 3.53 MIL/uL — ABNORMAL LOW (ref 4.22–5.81)
RDW: 17.2 % — ABNORMAL HIGH (ref 11.5–15.5)
WBC: 325.9 10*3/uL (ref 4.0–10.5)

## 2016-07-29 NOTE — Progress Notes (Signed)
Called and spoke with Elmyra Ricks at [facility] for Dr. Lissa Merlin patients cbcdiff.  Critical results given and faxed with confirmation received.

## 2016-08-27 ENCOUNTER — Encounter (HOSPITAL_COMMUNITY)
Admission: RE | Admit: 2016-08-27 | Discharge: 2016-08-27 | Disposition: A | Discharge status: Home / Self Care | Payer: Medicare Other | Source: Ambulatory Visit | Attending: Oncology | Admitting: Oncology

## 2016-08-27 ENCOUNTER — Encounter (HOSPITAL_COMMUNITY): Payer: Self-pay

## 2016-08-27 DIAGNOSIS — C9110 Chronic lymphocytic leukemia of B-cell type not having achieved remission: Secondary | ICD-10-CM | POA: Insufficient documentation

## 2016-08-27 LAB — CBC WITH DIFFERENTIAL/PLATELET
Band Neutrophils: 0 %
Basophils Absolute: 0 10*3/uL (ref 0.0–0.1)
Basophils Relative: 0 %
Blasts: 0 %
Eosinophils Absolute: 0 10*3/uL (ref 0.0–0.7)
Eosinophils Relative: 0 %
HCT: 34.2 % — ABNORMAL LOW (ref 39.0–52.0)
Hemoglobin: 10.1 g/dL — ABNORMAL LOW (ref 13.0–17.0)
Lymphocytes Relative: 96 %
Lymphs Abs: 383.9 10*3/uL — ABNORMAL HIGH (ref 0.7–4.0)
MCH: 31.3 pg (ref 26.0–34.0)
MCHC: 29.5 g/dL — ABNORMAL LOW (ref 30.0–36.0)
MCV: 105.9 fL — ABNORMAL HIGH (ref 78.0–100.0)
Metamyelocytes Relative: 0 %
Monocytes Absolute: 0 10*3/uL — ABNORMAL LOW (ref 0.1–1.0)
Monocytes Relative: 0 %
Myelocytes: 0 %
Neutro Abs: 16 10*3/uL — ABNORMAL HIGH (ref 1.7–7.7)
Neutrophils Relative %: 4 %
Other: 0 %
Platelets: 251 10*3/uL (ref 150–400)
Promyelocytes Absolute: 0 %
RBC: 3.23 MIL/uL — ABNORMAL LOW (ref 4.22–5.81)
RDW: 17.1 % — ABNORMAL HIGH (ref 11.5–15.5)
WBC: 399.9 10*3/uL (ref 4.0–10.5)
nRBC: 0 /100 WBC

## 2016-08-27 NOTE — Progress Notes (Signed)
Results for CONN, TROMBETTA (MRN [id number]) as of 08/27/2016 14:34  Ref. Range 08/27/2016 12:40  WBC Latest Ref Range: 4.0 - 10.5 K/uL 399.9 (HH)  RBC Latest Ref Range: 4.22 - 5.81 MIL/uL 3.23 (L)  Hemoglobin Latest Ref Range: 13.0 - 17.0 g/dL 10.1 (L)  HCT Latest Ref Range: 39.0 - 52.0 % 34.2 (L)  MCV Latest Ref Range: 78.0 - 100.0 fL 105.9 (H)  MCH Latest Ref Range: 26.0 - 34.0 pg 31.3  MCHC Latest Ref Range: 30.0 - 36.0 g/dL 29.5 (L)  RDW Latest Ref Range: 11.5 - 15.5 % 17.1 (H)  Platelets Latest Ref Range: 150 - 400 K/uL 251  nRBC Latest Ref Range: 0 /100 WBC 0  Neutrophils Latest Units: % 4  Lymphocytes Latest Units: % 96  Monocytes Relative Latest Units: % 0  Eosinophil Latest Units: % 0  Basophil Latest Units: % 0  NEUT# Latest Ref Range: 1.7 - 7.7 K/uL 16.0 (H)  Lymphocyte # Latest Ref Range: 0.7 - 4.0 K/uL 383.9 (H)  Monocyte # Latest Ref Range: 0.1 - 1.0 K/uL 0.0 (L)  Eosinophils Absolute Latest Ref Range: 0.0 - 0.7 K/uL 0.0  Basophils Absolute Latest Ref Range: 0.0 - 0.1 K/uL 0.0  WBC Morphology Unknown ATYPICAL LYMPHOCYTES  Myelocytes Latest Units: % 0  Metamyelocytes Relative Latest Units: % 0  Promyelocytes Absolute Latest Units: % 0  Blasts Latest Units: % 0  Band Neutrophils Latest Units: % 0  Other Latest Units: % 0

## 2016-08-27 NOTE — Progress Notes (Signed)
Called report of critical lab to Dr. Lissa Merlin office of WBC 399.9.

## 2016-09-11 DIAGNOSIS — J9601 Acute respiratory failure with hypoxia: Secondary | ICD-10-CM | POA: Insufficient documentation

## 2016-09-24 ENCOUNTER — Encounter (HOSPITAL_COMMUNITY): Admission: RE | Admit: 2016-09-24 | Payer: Medicare Other | Source: Ambulatory Visit

## 2016-10-08 ENCOUNTER — Other Ambulatory Visit (HOSPITAL_COMMUNITY): Payer: Self-pay | Admitting: Oncology

## 2016-10-08 ENCOUNTER — Other Ambulatory Visit (HOSPITAL_COMMUNITY): Payer: Self-pay | Admitting: *Deleted

## 2016-10-08 DIAGNOSIS — C9110 Chronic lymphocytic leukemia of B-cell type not having achieved remission: Secondary | ICD-10-CM

## 2016-10-09 ENCOUNTER — Encounter (HOSPITAL_COMMUNITY): Payer: Medicare Other | Attending: Oncology

## 2016-10-09 DIAGNOSIS — C9190 Lymphoid leukemia, unspecified not having achieved remission: Secondary | ICD-10-CM | POA: Diagnosis not present

## 2016-10-09 DIAGNOSIS — C9110 Chronic lymphocytic leukemia of B-cell type not having achieved remission: Secondary | ICD-10-CM

## 2016-10-09 LAB — CBC WITH DIFFERENTIAL/PLATELET
Basophils Absolute: 0 10*3/uL (ref 0.0–0.1)
Basophils Relative: 0 %
Eosinophils Absolute: 0 10*3/uL (ref 0.0–0.7)
Eosinophils Relative: 0 %
HCT: 33.7 % — ABNORMAL LOW (ref 39.0–52.0)
Hemoglobin: 10.7 g/dL — ABNORMAL LOW (ref 13.0–17.0)
Lymphocytes Relative: 68 %
Lymphs Abs: 3.1 10*3/uL (ref 0.7–4.0)
MCH: 32.5 pg (ref 26.0–34.0)
MCHC: 31.8 g/dL (ref 30.0–36.0)
MCV: 102.4 fL — ABNORMAL HIGH (ref 78.0–100.0)
Monocytes Absolute: 0.4 10*3/uL (ref 0.1–1.0)
Monocytes Relative: 9 %
Neutro Abs: 1.1 10*3/uL — ABNORMAL LOW (ref 1.7–7.7)
Neutrophils Relative %: 23 %
Platelets: 179 10*3/uL (ref 150–400)
RBC: 3.29 MIL/uL — ABNORMAL LOW (ref 4.22–5.81)
RDW: 20.3 % — ABNORMAL HIGH (ref 11.5–15.5)
WBC: 4.6 10*3/uL (ref 4.0–10.5)

## 2016-10-09 LAB — COMPREHENSIVE METABOLIC PANEL
ALT: 14 U/L — ABNORMAL LOW (ref 17–63)
AST: 23 U/L (ref 15–41)
Albumin: 4.4 g/dL (ref 3.5–5.0)
Alkaline Phosphatase: 39 U/L (ref 38–126)
Anion gap: 9 (ref 5–15)
BUN: 17 mg/dL (ref 6–20)
CO2: 26 mmol/L (ref 22–32)
Calcium: 7.8 mg/dL — ABNORMAL LOW (ref 8.9–10.3)
Chloride: 105 mmol/L (ref 101–111)
Creatinine, Ser: 0.95 mg/dL (ref 0.61–1.24)
GFR calc Af Amer: >60 mL/min (ref >60)
GFR calc non Af Amer: >60 mL/min (ref >60)
Glucose, Bld: 124 mg/dL — ABNORMAL HIGH (ref 65–99)
Potassium: 5.1 mmol/L (ref 3.5–5.1)
Sodium: 140 mmol/L (ref 135–145)
Total Bilirubin: 1.3 mg/dL — ABNORMAL HIGH (ref 0.3–1.2)
Total Protein: 6.6 g/dL (ref 6.5–8.1)

## 2016-10-09 LAB — MAGNESIUM: Magnesium: 2 mg/dL (ref 1.7–2.4)

## 2016-10-09 LAB — URIC ACID: Uric Acid, Serum: 8.6 mg/dL — ABNORMAL HIGH (ref 4.4–7.6)

## 2016-10-09 LAB — LACTATE DEHYDROGENASE: LDH: 244 U/L — ABNORMAL HIGH (ref 98–192)

## 2017-01-10 ENCOUNTER — Other Ambulatory Visit (HOSPITAL_COMMUNITY)
Admission: RE | Admit: 2017-01-10 | Discharge: 2017-01-10 | Disposition: A | Discharge status: Home / Self Care | Payer: Medicare Other | Source: Ambulatory Visit | Attending: Oncology | Admitting: Oncology

## 2017-01-10 DIAGNOSIS — C9190 Lymphoid leukemia, unspecified not having achieved remission: Secondary | ICD-10-CM | POA: Diagnosis present

## 2017-01-10 LAB — CBC WITH DIFFERENTIAL/PLATELET
Basophils Absolute: 0 10*3/uL (ref 0.0–0.1)
Basophils Relative: 0 %
Eosinophils Absolute: 0.1 10*3/uL (ref 0.0–0.7)
Eosinophils Relative: 1 %
HCT: 44.2 % (ref 39.0–52.0)
Hemoglobin: 15 g/dL (ref 13.0–17.0)
Lymphocytes Relative: 23 %
Lymphs Abs: 2 10*3/uL (ref 0.7–4.0)
MCH: 29.4 pg (ref 26.0–34.0)
MCHC: 33.9 g/dL (ref 30.0–36.0)
MCV: 86.7 fL (ref 78.0–100.0)
Monocytes Absolute: 1 10*3/uL (ref 0.1–1.0)
Monocytes Relative: 12 %
Neutro Abs: 5.5 10*3/uL (ref 1.7–7.7)
Neutrophils Relative %: 64 %
Platelets: 256 10*3/uL (ref 150–400)
RBC: 5.1 MIL/uL (ref 4.22–5.81)
RDW: 14.9 % (ref 11.5–15.5)
WBC: 8.6 10*3/uL (ref 4.0–10.5)

## 2017-04-08 DIAGNOSIS — Z0100 Encounter for examination of eyes and vision without abnormal findings: Secondary | ICD-10-CM | POA: Diagnosis not present

## 2017-04-08 DIAGNOSIS — E119 Type 2 diabetes mellitus without complications: Secondary | ICD-10-CM | POA: Diagnosis not present

## 2017-04-14 DIAGNOSIS — I10 Essential (primary) hypertension: Secondary | ICD-10-CM | POA: Diagnosis not present

## 2017-04-14 DIAGNOSIS — E790 Hyperuricemia without signs of inflammatory arthritis and tophaceous disease: Secondary | ICD-10-CM | POA: Diagnosis not present

## 2017-04-14 DIAGNOSIS — C9110 Chronic lymphocytic leukemia of B-cell type not having achieved remission: Secondary | ICD-10-CM | POA: Diagnosis not present

## 2017-04-14 DIAGNOSIS — M793 Panniculitis, unspecified: Secondary | ICD-10-CM | POA: Diagnosis not present

## 2017-04-14 DIAGNOSIS — Z9889 Other specified postprocedural states: Secondary | ICD-10-CM | POA: Diagnosis not present

## 2017-04-14 DIAGNOSIS — Z7982 Long term (current) use of aspirin: Secondary | ICD-10-CM | POA: Diagnosis not present

## 2017-04-14 DIAGNOSIS — C9190 Lymphoid leukemia, unspecified not having achieved remission: Secondary | ICD-10-CM | POA: Diagnosis not present

## 2017-04-14 DIAGNOSIS — I4891 Unspecified atrial fibrillation: Secondary | ICD-10-CM | POA: Diagnosis not present

## 2017-04-14 DIAGNOSIS — L986 Other infiltrative disorders of the skin and subcutaneous tissue: Secondary | ICD-10-CM | POA: Diagnosis not present

## 2017-04-14 DIAGNOSIS — I96 Gangrene, not elsewhere classified: Secondary | ICD-10-CM | POA: Diagnosis not present

## 2017-04-14 DIAGNOSIS — Z79899 Other long term (current) drug therapy: Secondary | ICD-10-CM | POA: Diagnosis not present

## 2017-07-21 DIAGNOSIS — C9190 Lymphoid leukemia, unspecified not having achieved remission: Secondary | ICD-10-CM | POA: Diagnosis not present

## 2017-08-29 DIAGNOSIS — Z79899 Other long term (current) drug therapy: Secondary | ICD-10-CM | POA: Diagnosis not present

## 2017-09-01 DIAGNOSIS — I10 Essential (primary) hypertension: Secondary | ICD-10-CM | POA: Diagnosis not present

## 2017-09-01 DIAGNOSIS — Z0000 Encounter for general adult medical examination without abnormal findings: Secondary | ICD-10-CM | POA: Diagnosis not present

## 2017-09-01 DIAGNOSIS — E119 Type 2 diabetes mellitus without complications: Secondary | ICD-10-CM | POA: Diagnosis not present

## 2017-09-01 DIAGNOSIS — D509 Iron deficiency anemia, unspecified: Secondary | ICD-10-CM | POA: Diagnosis not present

## 2017-09-01 DIAGNOSIS — C9110 Chronic lymphocytic leukemia of B-cell type not having achieved remission: Secondary | ICD-10-CM | POA: Diagnosis not present

## 2017-10-20 DIAGNOSIS — C9110 Chronic lymphocytic leukemia of B-cell type not having achieved remission: Secondary | ICD-10-CM | POA: Diagnosis not present

## 2017-10-20 DIAGNOSIS — R6889 Other general symptoms and signs: Secondary | ICD-10-CM | POA: Diagnosis not present

## 2017-10-20 DIAGNOSIS — C9190 Lymphoid leukemia, unspecified not having achieved remission: Secondary | ICD-10-CM | POA: Diagnosis not present

## 2017-11-07 ENCOUNTER — Other Ambulatory Visit (HOSPITAL_COMMUNITY)
Admission: RE | Admit: 2017-11-07 | Discharge: 2017-11-07 | Disposition: A | Discharge status: Home / Self Care | Payer: Medicare Other | Source: Ambulatory Visit | Attending: Oncology | Admitting: Oncology

## 2017-11-07 DIAGNOSIS — C9190 Lymphoid leukemia, unspecified not having achieved remission: Secondary | ICD-10-CM | POA: Insufficient documentation

## 2017-11-07 LAB — COMPREHENSIVE METABOLIC PANEL
ALT: 50 U/L — ABNORMAL HIGH (ref 0–44)
AST: 38 U/L (ref 15–41)
Albumin: 4.4 g/dL (ref 3.5–5.0)
Alkaline Phosphatase: 56 U/L (ref 38–126)
Anion gap: 13 (ref 5–15)
BUN: 16 mg/dL (ref 8–23)
CO2: 21 mmol/L — ABNORMAL LOW (ref 22–32)
Calcium: 9.1 mg/dL (ref 8.9–10.3)
Chloride: 111 mmol/L (ref 98–111)
Creatinine, Ser: 1.12 mg/dL (ref 0.61–1.24)
GFR calc Af Amer: >60 mL/min (ref >60)
GFR calc non Af Amer: >60 mL/min (ref >60)
Glucose, Bld: 144 mg/dL — ABNORMAL HIGH (ref 70–99)
Potassium: 4.2 mmol/L (ref 3.5–5.1)
Sodium: 145 mmol/L (ref 135–145)
Total Bilirubin: 1.1 mg/dL (ref 0.3–1.2)
Total Protein: 7.4 g/dL (ref 6.5–8.1)

## 2017-11-07 LAB — CBC WITH DIFFERENTIAL/PLATELET
Basophils Absolute: 0 10*3/uL (ref 0.0–0.1)
Basophils Relative: 0 %
Eosinophils Absolute: 0 10*3/uL (ref 0.0–0.7)
Eosinophils Relative: 0 %
HCT: 40.3 % (ref 39.0–52.0)
Hemoglobin: 13.5 g/dL (ref 13.0–17.0)
Lymphocytes Relative: 14 %
Lymphs Abs: 1 10*3/uL (ref 0.7–4.0)
MCH: 31.5 pg (ref 26.0–34.0)
MCHC: 33.5 g/dL (ref 30.0–36.0)
MCV: 93.9 fL (ref 78.0–100.0)
Monocytes Absolute: 0.7 10*3/uL (ref 0.1–1.0)
Monocytes Relative: 10 %
Neutro Abs: 5.2 10*3/uL (ref 1.7–7.7)
Neutrophils Relative %: 76 %
Platelets: 188 10*3/uL (ref 150–400)
RBC: 4.29 MIL/uL (ref 4.22–5.81)
RDW: 15.2 % (ref 11.5–15.5)
WBC: 6.9 10*3/uL (ref 4.0–10.5)

## 2017-11-07 LAB — URIC ACID: Uric Acid, Serum: 9.3 mg/dL — ABNORMAL HIGH (ref 3.7–8.6)

## 2017-11-07 LAB — MAGNESIUM: Magnesium: 1.9 mg/dL (ref 1.7–2.4)

## 2017-11-07 LAB — PHOSPHORUS: Phosphorus: 3 mg/dL (ref 2.5–4.6)

## 2017-11-07 LAB — LACTATE DEHYDROGENASE: LDH: 184 U/L (ref 98–192)

## 2018-01-12 DIAGNOSIS — E119 Type 2 diabetes mellitus without complications: Secondary | ICD-10-CM | POA: Diagnosis not present

## 2018-01-12 DIAGNOSIS — E782 Mixed hyperlipidemia: Secondary | ICD-10-CM | POA: Diagnosis not present

## 2018-01-12 DIAGNOSIS — I10 Essential (primary) hypertension: Secondary | ICD-10-CM | POA: Diagnosis not present

## 2018-01-19 DIAGNOSIS — I313 Pericardial effusion (noninflammatory): Secondary | ICD-10-CM | POA: Diagnosis not present

## 2018-01-19 DIAGNOSIS — C9110 Chronic lymphocytic leukemia of B-cell type not having achieved remission: Secondary | ICD-10-CM | POA: Diagnosis not present

## 2018-01-19 DIAGNOSIS — I480 Paroxysmal atrial fibrillation: Secondary | ICD-10-CM | POA: Diagnosis not present

## 2018-01-19 DIAGNOSIS — I10 Essential (primary) hypertension: Secondary | ICD-10-CM | POA: Diagnosis not present

## 2018-01-19 DIAGNOSIS — E782 Mixed hyperlipidemia: Secondary | ICD-10-CM | POA: Diagnosis not present

## 2018-01-19 DIAGNOSIS — E1169 Type 2 diabetes mellitus with other specified complication: Secondary | ICD-10-CM | POA: Diagnosis not present

## 2018-01-19 DIAGNOSIS — Z79899 Other long term (current) drug therapy: Secondary | ICD-10-CM | POA: Diagnosis not present

## 2018-01-19 DIAGNOSIS — R6889 Other general symptoms and signs: Secondary | ICD-10-CM | POA: Diagnosis not present

## 2018-01-19 DIAGNOSIS — I4891 Unspecified atrial fibrillation: Secondary | ICD-10-CM | POA: Diagnosis not present

## 2018-01-19 DIAGNOSIS — E790 Hyperuricemia without signs of inflammatory arthritis and tophaceous disease: Secondary | ICD-10-CM | POA: Diagnosis not present

## 2018-01-19 DIAGNOSIS — M793 Panniculitis, unspecified: Secondary | ICD-10-CM | POA: Diagnosis not present

## 2018-01-20 DIAGNOSIS — H269 Unspecified cataract: Secondary | ICD-10-CM | POA: Diagnosis not present

## 2018-01-20 DIAGNOSIS — E876 Hypokalemia: Secondary | ICD-10-CM | POA: Diagnosis not present

## 2018-01-20 DIAGNOSIS — D509 Iron deficiency anemia, unspecified: Secondary | ICD-10-CM | POA: Diagnosis not present

## 2018-01-20 DIAGNOSIS — I480 Paroxysmal atrial fibrillation: Secondary | ICD-10-CM | POA: Diagnosis not present

## 2018-02-04 DIAGNOSIS — I10 Essential (primary) hypertension: Secondary | ICD-10-CM | POA: Diagnosis not present

## 2018-02-04 DIAGNOSIS — E782 Mixed hyperlipidemia: Secondary | ICD-10-CM | POA: Diagnosis not present

## 2018-04-27 DIAGNOSIS — I313 Pericardial effusion (noninflammatory): Secondary | ICD-10-CM | POA: Diagnosis not present

## 2018-04-27 DIAGNOSIS — I4891 Unspecified atrial fibrillation: Secondary | ICD-10-CM | POA: Diagnosis not present

## 2018-04-27 DIAGNOSIS — M793 Panniculitis, unspecified: Secondary | ICD-10-CM | POA: Diagnosis not present

## 2018-04-27 DIAGNOSIS — E790 Hyperuricemia without signs of inflammatory arthritis and tophaceous disease: Secondary | ICD-10-CM | POA: Diagnosis not present

## 2018-04-27 DIAGNOSIS — Z7982 Long term (current) use of aspirin: Secondary | ICD-10-CM | POA: Diagnosis not present

## 2018-04-27 DIAGNOSIS — C9110 Chronic lymphocytic leukemia of B-cell type not having achieved remission: Secondary | ICD-10-CM | POA: Diagnosis not present

## 2018-04-27 DIAGNOSIS — R6889 Other general symptoms and signs: Secondary | ICD-10-CM | POA: Diagnosis not present

## 2018-06-09 DIAGNOSIS — D509 Iron deficiency anemia, unspecified: Secondary | ICD-10-CM | POA: Diagnosis not present

## 2018-06-09 DIAGNOSIS — E1169 Type 2 diabetes mellitus with other specified complication: Secondary | ICD-10-CM | POA: Diagnosis not present

## 2018-06-09 DIAGNOSIS — I10 Essential (primary) hypertension: Secondary | ICD-10-CM | POA: Diagnosis not present

## 2018-06-09 DIAGNOSIS — C9110 Chronic lymphocytic leukemia of B-cell type not having achieved remission: Secondary | ICD-10-CM | POA: Diagnosis not present

## 2018-06-26 DIAGNOSIS — N182 Chronic kidney disease, stage 2 (mild): Secondary | ICD-10-CM | POA: Diagnosis not present

## 2018-06-26 DIAGNOSIS — E1165 Type 2 diabetes mellitus with hyperglycemia: Secondary | ICD-10-CM | POA: Diagnosis not present

## 2018-06-26 DIAGNOSIS — R945 Abnormal results of liver function studies: Secondary | ICD-10-CM | POA: Diagnosis not present

## 2018-06-26 DIAGNOSIS — I10 Essential (primary) hypertension: Secondary | ICD-10-CM | POA: Diagnosis not present

## 2018-06-26 DIAGNOSIS — E782 Mixed hyperlipidemia: Secondary | ICD-10-CM | POA: Diagnosis not present

## 2018-07-27 DIAGNOSIS — E790 Hyperuricemia without signs of inflammatory arthritis and tophaceous disease: Secondary | ICD-10-CM | POA: Diagnosis not present

## 2018-07-27 DIAGNOSIS — C9110 Chronic lymphocytic leukemia of B-cell type not having achieved remission: Secondary | ICD-10-CM | POA: Diagnosis not present

## 2018-07-27 DIAGNOSIS — R6889 Other general symptoms and signs: Secondary | ICD-10-CM | POA: Diagnosis not present

## 2018-07-27 DIAGNOSIS — Z9221 Personal history of antineoplastic chemotherapy: Secondary | ICD-10-CM | POA: Diagnosis not present

## 2018-10-23 DIAGNOSIS — Z0000 Encounter for general adult medical examination without abnormal findings: Secondary | ICD-10-CM | POA: Diagnosis not present

## 2018-10-26 DIAGNOSIS — C9110 Chronic lymphocytic leukemia of B-cell type not having achieved remission: Secondary | ICD-10-CM | POA: Diagnosis not present

## 2018-10-28 DIAGNOSIS — N182 Chronic kidney disease, stage 2 (mild): Secondary | ICD-10-CM | POA: Diagnosis not present

## 2018-10-28 DIAGNOSIS — E785 Hyperlipidemia, unspecified: Secondary | ICD-10-CM | POA: Diagnosis not present

## 2018-10-28 DIAGNOSIS — E1169 Type 2 diabetes mellitus with other specified complication: Secondary | ICD-10-CM | POA: Diagnosis not present

## 2018-10-28 DIAGNOSIS — D509 Iron deficiency anemia, unspecified: Secondary | ICD-10-CM | POA: Diagnosis not present

## 2018-10-28 DIAGNOSIS — E1165 Type 2 diabetes mellitus with hyperglycemia: Secondary | ICD-10-CM | POA: Diagnosis not present

## 2018-11-04 DIAGNOSIS — R945 Abnormal results of liver function studies: Secondary | ICD-10-CM | POA: Diagnosis not present

## 2018-11-04 DIAGNOSIS — E782 Mixed hyperlipidemia: Secondary | ICD-10-CM | POA: Diagnosis not present

## 2018-11-04 DIAGNOSIS — E1165 Type 2 diabetes mellitus with hyperglycemia: Secondary | ICD-10-CM | POA: Diagnosis not present

## 2018-11-04 DIAGNOSIS — I10 Essential (primary) hypertension: Secondary | ICD-10-CM | POA: Diagnosis not present

## 2018-11-04 DIAGNOSIS — C9111 Chronic lymphocytic leukemia of B-cell type in remission: Secondary | ICD-10-CM | POA: Diagnosis not present

## 2018-11-16 DIAGNOSIS — E782 Mixed hyperlipidemia: Secondary | ICD-10-CM | POA: Diagnosis not present

## 2018-11-16 DIAGNOSIS — I480 Paroxysmal atrial fibrillation: Secondary | ICD-10-CM | POA: Diagnosis not present

## 2018-11-16 DIAGNOSIS — I10 Essential (primary) hypertension: Secondary | ICD-10-CM | POA: Diagnosis not present

## 2018-11-16 DIAGNOSIS — E119 Type 2 diabetes mellitus without complications: Secondary | ICD-10-CM | POA: Diagnosis not present

## 2018-11-16 DIAGNOSIS — E1169 Type 2 diabetes mellitus with other specified complication: Secondary | ICD-10-CM | POA: Diagnosis not present

## 2018-12-16 DIAGNOSIS — E1169 Type 2 diabetes mellitus with other specified complication: Secondary | ICD-10-CM | POA: Diagnosis not present

## 2018-12-16 DIAGNOSIS — I10 Essential (primary) hypertension: Secondary | ICD-10-CM | POA: Diagnosis not present

## 2018-12-16 DIAGNOSIS — I480 Paroxysmal atrial fibrillation: Secondary | ICD-10-CM | POA: Diagnosis not present

## 2018-12-16 DIAGNOSIS — E119 Type 2 diabetes mellitus without complications: Secondary | ICD-10-CM | POA: Diagnosis not present

## 2018-12-16 DIAGNOSIS — E782 Mixed hyperlipidemia: Secondary | ICD-10-CM | POA: Diagnosis not present

## 2019-01-25 DIAGNOSIS — R6889 Other general symptoms and signs: Secondary | ICD-10-CM | POA: Diagnosis not present

## 2019-01-25 DIAGNOSIS — C9110 Chronic lymphocytic leukemia of B-cell type not having achieved remission: Secondary | ICD-10-CM | POA: Diagnosis not present

## 2019-02-02 DIAGNOSIS — E538 Deficiency of other specified B group vitamins: Secondary | ICD-10-CM | POA: Insufficient documentation

## 2019-02-03 ENCOUNTER — Other Ambulatory Visit: Payer: Self-pay

## 2019-02-03 NOTE — Patient Outreach (Signed)
[facility]) Care Management  02/03/2019  CHIKA WEB 05-04-1950 PO:[id number]   Medication Adherence call to Mr. Rudell Rutherford HIPPA Compliant Voice message left with a call back number. Mr. Stclair is showing past due on Metformin 1000 mg ,Atorvastatin 80 mg and Irbesartan 300 mg under [place].   [facility] Management Direct Dial [phone]  Fax [phone] [email or web address]

## 2019-02-10 DIAGNOSIS — D519 Vitamin B12 deficiency anemia, unspecified: Secondary | ICD-10-CM | POA: Diagnosis not present

## 2019-02-17 DIAGNOSIS — D519 Vitamin B12 deficiency anemia, unspecified: Secondary | ICD-10-CM | POA: Diagnosis not present

## 2019-02-24 DIAGNOSIS — D519 Vitamin B12 deficiency anemia, unspecified: Secondary | ICD-10-CM | POA: Diagnosis not present

## 2019-03-02 ENCOUNTER — Other Ambulatory Visit: Payer: Self-pay

## 2019-03-02 NOTE — Patient Outreach (Signed)
[facility]) Care Management  03/02/2019  Tim Duncan 06-05-1950 MD:[id number]   Medication Adherence call to Tim Duncan HIPPA Compliant Voice message left with a call back number. Tim Duncan is showing past due on Atorvastatin 80 mg,Metformin 1000 mg and Irbesartan 300 mg under [place].   [place] Management Direct Dial [phone]  Fax [phone] [email or web address]

## 2019-03-03 DIAGNOSIS — D519 Vitamin B12 deficiency anemia, unspecified: Secondary | ICD-10-CM | POA: Diagnosis not present

## 2019-03-12 DIAGNOSIS — E1165 Type 2 diabetes mellitus with hyperglycemia: Secondary | ICD-10-CM | POA: Diagnosis not present

## 2019-03-12 DIAGNOSIS — E1169 Type 2 diabetes mellitus with other specified complication: Secondary | ICD-10-CM | POA: Diagnosis not present

## 2019-03-12 DIAGNOSIS — E785 Hyperlipidemia, unspecified: Secondary | ICD-10-CM | POA: Diagnosis not present

## 2019-03-12 DIAGNOSIS — D519 Vitamin B12 deficiency anemia, unspecified: Secondary | ICD-10-CM | POA: Diagnosis not present

## 2019-03-17 DIAGNOSIS — C9111 Chronic lymphocytic leukemia of B-cell type in remission: Secondary | ICD-10-CM | POA: Diagnosis not present

## 2019-03-17 DIAGNOSIS — E782 Mixed hyperlipidemia: Secondary | ICD-10-CM | POA: Diagnosis not present

## 2019-03-17 DIAGNOSIS — E1165 Type 2 diabetes mellitus with hyperglycemia: Secondary | ICD-10-CM | POA: Diagnosis not present

## 2019-03-17 DIAGNOSIS — R945 Abnormal results of liver function studies: Secondary | ICD-10-CM | POA: Diagnosis not present

## 2019-03-17 DIAGNOSIS — I10 Essential (primary) hypertension: Secondary | ICD-10-CM | POA: Diagnosis not present

## 2019-04-12 DIAGNOSIS — D519 Vitamin B12 deficiency anemia, unspecified: Secondary | ICD-10-CM | POA: Diagnosis not present

## 2019-04-26 DIAGNOSIS — C9110 Chronic lymphocytic leukemia of B-cell type not having achieved remission: Secondary | ICD-10-CM | POA: Diagnosis not present

## 2019-04-26 DIAGNOSIS — R6889 Other general symptoms and signs: Secondary | ICD-10-CM | POA: Diagnosis not present

## 2019-04-26 DIAGNOSIS — M793 Panniculitis, unspecified: Secondary | ICD-10-CM | POA: Diagnosis not present

## 2019-04-26 DIAGNOSIS — E790 Hyperuricemia without signs of inflammatory arthritis and tophaceous disease: Secondary | ICD-10-CM | POA: Diagnosis not present

## 2019-04-26 DIAGNOSIS — E119 Type 2 diabetes mellitus without complications: Secondary | ICD-10-CM | POA: Diagnosis not present

## 2019-04-26 DIAGNOSIS — Z7984 Long term (current) use of oral hypoglycemic drugs: Secondary | ICD-10-CM | POA: Diagnosis not present

## 2019-04-26 DIAGNOSIS — Z95828 Presence of other vascular implants and grafts: Secondary | ICD-10-CM | POA: Diagnosis not present

## 2019-05-05 DIAGNOSIS — D519 Vitamin B12 deficiency anemia, unspecified: Secondary | ICD-10-CM | POA: Diagnosis not present

## 2019-06-11 DIAGNOSIS — D519 Vitamin B12 deficiency anemia, unspecified: Secondary | ICD-10-CM | POA: Diagnosis not present

## 2019-07-01 DIAGNOSIS — I480 Paroxysmal atrial fibrillation: Secondary | ICD-10-CM | POA: Diagnosis not present

## 2019-07-01 DIAGNOSIS — E782 Mixed hyperlipidemia: Secondary | ICD-10-CM | POA: Diagnosis not present

## 2019-07-01 DIAGNOSIS — I10 Essential (primary) hypertension: Secondary | ICD-10-CM | POA: Diagnosis not present

## 2019-07-01 DIAGNOSIS — E119 Type 2 diabetes mellitus without complications: Secondary | ICD-10-CM | POA: Diagnosis not present

## 2019-07-01 DIAGNOSIS — E1169 Type 2 diabetes mellitus with other specified complication: Secondary | ICD-10-CM | POA: Diagnosis not present

## 2019-07-07 DIAGNOSIS — E1165 Type 2 diabetes mellitus with hyperglycemia: Secondary | ICD-10-CM | POA: Diagnosis not present

## 2019-07-07 DIAGNOSIS — D519 Vitamin B12 deficiency anemia, unspecified: Secondary | ICD-10-CM | POA: Diagnosis not present

## 2019-07-07 DIAGNOSIS — C9111 Chronic lymphocytic leukemia of B-cell type in remission: Secondary | ICD-10-CM | POA: Diagnosis not present

## 2019-07-07 DIAGNOSIS — D509 Iron deficiency anemia, unspecified: Secondary | ICD-10-CM | POA: Diagnosis not present

## 2019-07-07 DIAGNOSIS — C9110 Chronic lymphocytic leukemia of B-cell type not having achieved remission: Secondary | ICD-10-CM | POA: Diagnosis not present

## 2019-07-13 DIAGNOSIS — I10 Essential (primary) hypertension: Secondary | ICD-10-CM | POA: Diagnosis not present

## 2019-07-13 DIAGNOSIS — C9111 Chronic lymphocytic leukemia of B-cell type in remission: Secondary | ICD-10-CM | POA: Diagnosis not present

## 2019-07-13 DIAGNOSIS — Z0001 Encounter for general adult medical examination with abnormal findings: Secondary | ICD-10-CM | POA: Diagnosis not present

## 2019-07-13 DIAGNOSIS — E782 Mixed hyperlipidemia: Secondary | ICD-10-CM | POA: Diagnosis not present

## 2019-07-13 DIAGNOSIS — R945 Abnormal results of liver function studies: Secondary | ICD-10-CM | POA: Diagnosis not present

## 2019-07-13 DIAGNOSIS — E1165 Type 2 diabetes mellitus with hyperglycemia: Secondary | ICD-10-CM | POA: Diagnosis not present

## 2019-07-26 DIAGNOSIS — Z95828 Presence of other vascular implants and grafts: Secondary | ICD-10-CM | POA: Diagnosis not present

## 2019-07-26 DIAGNOSIS — Z7984 Long term (current) use of oral hypoglycemic drugs: Secondary | ICD-10-CM | POA: Diagnosis not present

## 2019-07-26 DIAGNOSIS — C9110 Chronic lymphocytic leukemia of B-cell type not having achieved remission: Secondary | ICD-10-CM | POA: Diagnosis not present

## 2019-07-26 DIAGNOSIS — E538 Deficiency of other specified B group vitamins: Secondary | ICD-10-CM | POA: Diagnosis not present

## 2019-07-26 DIAGNOSIS — M793 Panniculitis, unspecified: Secondary | ICD-10-CM | POA: Diagnosis not present

## 2019-07-26 DIAGNOSIS — E119 Type 2 diabetes mellitus without complications: Secondary | ICD-10-CM | POA: Diagnosis not present

## 2019-07-26 DIAGNOSIS — R6889 Other general symptoms and signs: Secondary | ICD-10-CM | POA: Diagnosis not present

## 2019-07-26 DIAGNOSIS — I313 Pericardial effusion (noninflammatory): Secondary | ICD-10-CM | POA: Diagnosis not present

## 2019-07-26 DIAGNOSIS — Z79899 Other long term (current) drug therapy: Secondary | ICD-10-CM | POA: Diagnosis not present

## 2019-07-26 DIAGNOSIS — E790 Hyperuricemia without signs of inflammatory arthritis and tophaceous disease: Secondary | ICD-10-CM | POA: Diagnosis not present

## 2019-07-26 DIAGNOSIS — I4891 Unspecified atrial fibrillation: Secondary | ICD-10-CM | POA: Diagnosis not present

## 2019-08-04 DIAGNOSIS — E1122 Type 2 diabetes mellitus with diabetic chronic kidney disease: Secondary | ICD-10-CM | POA: Diagnosis not present

## 2019-08-04 DIAGNOSIS — I129 Hypertensive chronic kidney disease with stage 1 through stage 4 chronic kidney disease, or unspecified chronic kidney disease: Secondary | ICD-10-CM | POA: Diagnosis not present

## 2019-08-04 DIAGNOSIS — Z0001 Encounter for general adult medical examination with abnormal findings: Secondary | ICD-10-CM | POA: Diagnosis not present

## 2019-08-04 DIAGNOSIS — N189 Chronic kidney disease, unspecified: Secondary | ICD-10-CM | POA: Diagnosis not present

## 2019-08-04 DIAGNOSIS — D519 Vitamin B12 deficiency anemia, unspecified: Secondary | ICD-10-CM | POA: Diagnosis not present

## 2019-08-13 DIAGNOSIS — D519 Vitamin B12 deficiency anemia, unspecified: Secondary | ICD-10-CM | POA: Diagnosis not present

## 2019-09-07 DIAGNOSIS — T1490XA Injury, unspecified, initial encounter: Secondary | ICD-10-CM | POA: Diagnosis not present

## 2019-09-07 DIAGNOSIS — D519 Vitamin B12 deficiency anemia, unspecified: Secondary | ICD-10-CM | POA: Diagnosis not present

## 2019-09-07 DIAGNOSIS — W19XXXA Unspecified fall, initial encounter: Secondary | ICD-10-CM | POA: Diagnosis not present

## 2019-09-07 DIAGNOSIS — R5381 Other malaise: Secondary | ICD-10-CM | POA: Diagnosis not present

## 2019-09-09 ENCOUNTER — Emergency Department (HOSPITAL_COMMUNITY): Payer: Medicare Other

## 2019-09-09 ENCOUNTER — Emergency Department (HOSPITAL_COMMUNITY)
Admission: EM | Admit: 2019-09-09 | Discharge: 2019-09-09 | Disposition: A | Discharge status: Home / Self Care | Payer: Medicare Other | Attending: Emergency Medicine | Admitting: Emergency Medicine

## 2019-09-09 ENCOUNTER — Other Ambulatory Visit: Payer: Self-pay

## 2019-09-09 ENCOUNTER — Encounter (HOSPITAL_COMMUNITY): Payer: Self-pay | Admitting: Emergency Medicine

## 2019-09-09 DIAGNOSIS — Y998 Other external cause status: Secondary | ICD-10-CM | POA: Insufficient documentation

## 2019-09-09 DIAGNOSIS — Z87891 Personal history of nicotine dependence: Secondary | ICD-10-CM | POA: Diagnosis not present

## 2019-09-09 DIAGNOSIS — Y9389 Activity, other specified: Secondary | ICD-10-CM | POA: Diagnosis not present

## 2019-09-09 DIAGNOSIS — Y9289 Other specified places as the place of occurrence of the external cause: Secondary | ICD-10-CM | POA: Diagnosis not present

## 2019-09-09 DIAGNOSIS — M25562 Pain in left knee: Secondary | ICD-10-CM | POA: Diagnosis not present

## 2019-09-09 DIAGNOSIS — X501XXA Overexertion from prolonged static or awkward postures, initial encounter: Secondary | ICD-10-CM | POA: Diagnosis not present

## 2019-09-09 DIAGNOSIS — M25462 Effusion, left knee: Secondary | ICD-10-CM | POA: Diagnosis not present

## 2019-09-09 MED ORDER — MELOXICAM 7.5 MG PO TABS: 7.5000 mg | ORAL_TABLET | Freq: Every day | ORAL | 0 refills | Status: DC

## 2019-09-09 NOTE — Clinical Social Work Note (Signed)
Transition of Care [facility]) - Emergency Department Mini Assessment  Patient Details  Name: Tim Duncan MRN: [id number] Date of Birth: [birth year]  Transition of Care [facility]) CM/SW Contact:    Sherie Don, LCSW Phone Number: 09/09/2019, 9:33 AM  Clinical Narrative: Patient is a 69 year old male who presented to the ED for knee pain. TOC received consult for DME (walker). CSW contacted Edwin Dada with [employer] to order a walker. [employer] accepted referral. EDP updated and order for walker was entered. TOC signing off.  ED Mini Assessment: What brought you to the Emergency Department? : Knee pain Barriers to Discharge: ED Barriers Resolved Barrier interventions: Walker ordered through [employer] of departure: Car Interventions which prevented an admission or readmission: DME Provided  Patient Contact and Communications Key Contact 1: Edwin Dada Spoke with: Edwin Dada Contact Date: 09/09/19,   Contact time: 0907 Contact Phone Number: [phone] Call outcome: Gilford Rile will be delivered to ED Patient states their goals for this hospitalization and ongoing recovery are:: Discharge home CMS Medicare.gov Compare Post Acute Care list provided to:: Patient Choice offered to / list presented to : Patient  Admission diagnosis:  KNEE PAIN Patient Active Problem List   Diagnosis Date Noted  . Varicose veins of lower extremities with complications 97/74/1423  . Chronic venous insufficiency 06/23/2014  . Multiple contusions 06/23/2014  . Synovial cyst 12/15/2013  . CLL (chronic lymphocytic leukemia) ([place])   . Lymphadenopathy   . Lymphocytosis   . Thrombocytosis ([place])   . Anemia 08/26/2012   PCP:  Celene Squibb, MD Pharmacy:   [place],  - [address] [place] [address] [place] [address] [place] [address] Phone: [phone] Fax: [phone]  [place], [place], [place] - [place]  [place] [address] [place] [address] Phone: [phone] Fax: [place], [place] [address] Phone: [phone] Fax: [phone]  [place], [place], [place] [address] [place], [place] [address] [place] [address] Phone: [phone] Fax: [phone]

## 2019-09-09 NOTE — ED Provider Notes (Signed)
[facility] EMERGENCY DEPARTMENT Provider Note   CSN: [id number] Arrival date & time: 09/09/19  0749     History Chief Complaint  Patient presents with  . Knee Pain    Tim Duncan is a 69 y.o. male.  HPI   24yM with L knee pain. Onset 09/07/19. He was getting into a vehicle when he felt a "pop" in his L knee. Persistent pain since. Worse with movement. Pain worst anteriorly. Having difficulty walking because of it. No swelling. No numbness or tingling.   Past Medical History:  Diagnosis Date  . Anemia 08/26/2012  . BPH (benign prostatic hyperplasia)   . Cancer ([place])    cll  . CLL (chronic lymphocytic leukemia) ([place])   . Hypertension    off meds  . Lymphadenopathy   . Lymphocytosis   . Thrombocytosis ([place])   . Tremors of nervous system   . Wears dentures    top    Patient Active Problem List   Diagnosis Date Noted  . Varicose veins of lower extremities with complications 66/59/9357  . Chronic venous insufficiency 06/23/2014  . Multiple contusions 06/23/2014  . Synovial cyst 12/15/2013  . CLL (chronic lymphocytic leukemia) ([place])   . Lymphadenopathy   . Lymphocytosis   . Thrombocytosis ([place])   . Anemia 08/26/2012    Past Surgical History:  Procedure Laterality Date  . CATARACT EXTRACTION W/PHACO Right 06/21/2014   Procedure: CATARACT EXTRACTION PHACO AND INTRAOCULAR LENS PLACEMENT; CDE:  11.10;  Surgeon: Rutherford Guys, MD;  Location: [facility];  Service: Ophthalmology;  Laterality: Right;  . CATARACT EXTRACTION W/PHACO Left 07/12/2014   Procedure: CATARACT EXTRACTION PHACO AND INTRAOCULAR LENS PLACEMENT (IOC);  Surgeon: Rutherford Guys, MD;  Location: [facility];  Service: Ophthalmology;  Laterality: Left;  CDE:7.52  . ENDOVENOUS ABLATION SAPHENOUS VEIN W/ LASER Right 12-08-2014   endovenous laser ablation (right greater saphenous vein) by Curt Jews MD   . FRACTURE SURGERY  1990   rt ring finger  . HERNIA REPAIR  1992   umb  . ORIF ANKLE FRACTURE  1995   right  .  PORTACATH PLACEMENT  3/14  . SYNOVECTOMY Right 12/15/2013   Procedure: RIGHT RING FINGER FLEXOR SYNOVECTOMY;  Surgeon: Charlotte Crumb, MD;  Location: [facility];  Service: Orthopedics;  Laterality: Right;  . TONSILLECTOMY         Family History  Problem Relation Age of Onset  . Diabetes Mother     Social History   Tobacco Use  . Smoking status: Former Smoker    Packs/day: 2.00    Years: 20.00    Pack years: 40.00    Types: Cigarettes    Quit date: 12/13/1988    Years since quitting: 30.7  . Smokeless tobacco: Never Used  Substance Use Topics  . Alcohol use: No  . Drug use: No    Home Medications Prior to Admission medications   Medication Sig Start Date End Date Taking? Authorizing Provider  allopurinol (ZYLOPRIM) 300 MG tablet Take 300 mg by mouth daily.    [provider]  amLODipine (NORVASC) 5 MG tablet Take 10 mg by mouth daily.     [provider]  HYDROcodone-acetaminophen (NORCO/VICODIN) 5-325 MG per tablet Take 2 tablets by mouth every 4 (four) hours as needed. 12/08/13   [name], MD  ibrutinib (IMBRUVICA) 140 MG capsul Take 420 mg by mouth 3 (three) times daily.     [provider]  irbesartan (AVAPRO) 300 MG tablet Take 300 mg  by mouth daily.    [provider]  irbesartan (AVAPRO) 300 MG tablet Take by mouth.    [provider]  Multiple Vitamin (MULTIVITAMIN) tablet Take 1 tablet by mouth daily.    [provider]  naproxen (NAPROSYN) 500 MG tablet Take 1 tablet (500 mg total) by mouth 2 (two) times daily with a meal. 12/08/13   [name], MD  oxyCODONE-acetaminophen (ROXICET) 5-325 MG per tablet Take 1 tablet by mouth every 4 (four) hours as needed for severe pain. Patient not taking: Reported on 06/09/2014 12/15/13   Charlotte Crumb, MD  sulfamethoxazole-trimethoprim (SEPTRA DS) 800-160 MG per tablet Take 1 tablet by mouth every 12 (twelve) hours. 12/08/13   [name], MD  tamsulosin  (FLOMAX) 0.4 MG CAPS capsule Take 0.4 mg by mouth daily after supper.    [provider]    Allergies    Patient has no known allergies.  Review of Systems   Review of Systems All systems reviewed and negative, other than as noted in HPI.  Physical Exam Updated Vital Signs BP (!) 177/88 (BP Location: Right Arm)   Pulse 95   Temp 98.5 F (36.9 C) (Oral)   Resp 18   Ht 6' (1.829 m)   Wt 134.7 kg   SpO2 96%   BMI 40.28 kg/m   Physical Exam Vitals and nursing note reviewed.  Constitutional:      General: He is not in acute distress.    Appearance: He is well-developed.  HENT:     Head: Normocephalic and atraumatic.  Eyes:     General:        Right eye: No discharge.        Left eye: No discharge.     Conjunctiva/sclera: Conjunctivae normal.  Cardiovascular:     Rate and Rhythm: Normal rate and regular rhythm.     Heart sounds: Normal heart sounds. No murmur heard.  No friction rub. No gallop.   Pulmonary:     Effort: Pulmonary effort is normal. No respiratory distress.     Breath sounds: Normal breath sounds.  Abdominal:     General: There is no distension.     Palpations: Abdomen is soft.     Tenderness: There is no abdominal tenderness.  Musculoskeletal:     Cervical back: Neck supple.     Comments: L knee grossly normal in appearance and symmetric as compared to L. No appreciable effusion or overlying skin changes. Mild TTP medially/anteriorly. Can actively flex/ext but with some pain. Pain worst with extension against resistance. NVI.   Skin:    General: Skin is warm and dry.  Neurological:     Mental Status: He is alert.  Psychiatric:        Behavior: Behavior normal.        Thought Content: Thought content normal.     ED Results / Procedures / Treatments   Labs (all labs ordered are listed, but only abnormal results are displayed) Labs Reviewed - No data to display  EKG None  Radiology DG Knee Complete 4 Views Left  Result Date:  09/09/2019 CLINICAL DATA:  Left knee pain for 2 days since getting out of car with hearing left knee pop. EXAM: LEFT KNEE - COMPLETE 4+ VIEW COMPARISON:  None. FINDINGS: Small suprapatellar left knee joint effusion. No fracture or dislocation. No suspicious focal osseous lesions. Moderate to severe tricompartmental left knee osteoarthritis. No radiopaque foreign bodies. IMPRESSION: Small suprapatellar left knee joint effusion. No fracture or dislocation.  Moderate to severe tricompartmental left knee osteoarthritis. Electronically Signed   By: Ilona Sorrel M.D.   On: 09/09/2019 09:27    Procedures Procedures (including critical care time)  Medications Ordered in ED Medications - No data to display  ED Course  I have reviewed the triage vital signs and the nursing notes.  Pertinent labs & imaging results that were available during my care of the patient were reviewed by me and considered in my medical decision making (see chart for details).    MDM Rules/Calculators/A&P                          69yM with L knee pain. Likely soft tissue injury. Will x-ray though. Weight and poor balance preclude the use of crutches. Will see if we can arrange for a walker. PRN NSAIDs otherwise. Ortho or sports med follow-up for persistent symptoms.   Final Clinical Impression(s) / ED Diagnoses Final diagnoses:  Acute pain of left knee    Rx / DC Orders ED Discharge Orders    None       Virgel Manifold, MD 09/10/19 [phone]

## 2019-09-09 NOTE — ED Triage Notes (Signed)
Pt reports left knee pain since 09/07/19. Pt reports was getting out of car and reports heard left knee "pop". Pt able to bear weight, no obvious deformity.

## 2019-09-14 ENCOUNTER — Emergency Department (HOSPITAL_COMMUNITY): Payer: Medicare Other

## 2019-09-14 ENCOUNTER — Inpatient Hospital Stay (HOSPITAL_COMMUNITY)
Admission: AD | Admit: 2019-09-14 | Discharge: 2019-09-17 | DRG: 683 | Disposition: A | Discharge status: Skilled Nursing Facility | Payer: Medicare Other | Attending: Internal Medicine | Admitting: Internal Medicine

## 2019-09-14 ENCOUNTER — Other Ambulatory Visit: Payer: Self-pay

## 2019-09-14 ENCOUNTER — Encounter (HOSPITAL_COMMUNITY): Payer: Self-pay

## 2019-09-14 DIAGNOSIS — Z9842 Cataract extraction status, left eye: Secondary | ICD-10-CM

## 2019-09-14 DIAGNOSIS — E860 Dehydration: Secondary | ICD-10-CM | POA: Diagnosis not present

## 2019-09-14 DIAGNOSIS — Z20822 Contact with and (suspected) exposure to covid-19: Secondary | ICD-10-CM | POA: Diagnosis not present

## 2019-09-14 DIAGNOSIS — Z743 Need for continuous supervision: Secondary | ICD-10-CM | POA: Diagnosis not present

## 2019-09-14 DIAGNOSIS — N400 Enlarged prostate without lower urinary tract symptoms: Secondary | ICD-10-CM | POA: Diagnosis present

## 2019-09-14 DIAGNOSIS — Z6841 Body Mass Index (BMI) 40.0 and over, adult: Secondary | ICD-10-CM

## 2019-09-14 DIAGNOSIS — E861 Hypovolemia: Secondary | ICD-10-CM | POA: Diagnosis not present

## 2019-09-14 DIAGNOSIS — R296 Repeated falls: Secondary | ICD-10-CM | POA: Diagnosis not present

## 2019-09-14 DIAGNOSIS — E876 Hypokalemia: Secondary | ICD-10-CM | POA: Diagnosis not present

## 2019-09-14 DIAGNOSIS — Z87891 Personal history of nicotine dependence: Secondary | ICD-10-CM

## 2019-09-14 DIAGNOSIS — I517 Cardiomegaly: Secondary | ICD-10-CM | POA: Diagnosis not present

## 2019-09-14 DIAGNOSIS — E871 Hypo-osmolality and hyponatremia: Secondary | ICD-10-CM | POA: Diagnosis not present

## 2019-09-14 DIAGNOSIS — I4891 Unspecified atrial fibrillation: Secondary | ICD-10-CM

## 2019-09-14 DIAGNOSIS — Z9841 Cataract extraction status, right eye: Secondary | ICD-10-CM | POA: Diagnosis not present

## 2019-09-14 DIAGNOSIS — Y92009 Unspecified place in unspecified non-institutional (private) residence as the place of occurrence of the external cause: Secondary | ICD-10-CM

## 2019-09-14 DIAGNOSIS — Z961 Presence of intraocular lens: Secondary | ICD-10-CM | POA: Diagnosis present

## 2019-09-14 DIAGNOSIS — E870 Hyperosmolality and hypernatremia: Secondary | ICD-10-CM | POA: Diagnosis not present

## 2019-09-14 DIAGNOSIS — I10 Essential (primary) hypertension: Secondary | ICD-10-CM | POA: Diagnosis present

## 2019-09-14 DIAGNOSIS — C9110 Chronic lymphocytic leukemia of B-cell type not having achieved remission: Secondary | ICD-10-CM | POA: Diagnosis present

## 2019-09-14 DIAGNOSIS — N179 Acute kidney failure, unspecified: Principal | ICD-10-CM | POA: Diagnosis present

## 2019-09-14 DIAGNOSIS — Z79899 Other long term (current) drug therapy: Secondary | ICD-10-CM

## 2019-09-14 DIAGNOSIS — W19XXXA Unspecified fall, initial encounter: Secondary | ICD-10-CM

## 2019-09-14 DIAGNOSIS — I480 Paroxysmal atrial fibrillation: Secondary | ICD-10-CM | POA: Diagnosis present

## 2019-09-14 DIAGNOSIS — E785 Hyperlipidemia, unspecified: Secondary | ICD-10-CM | POA: Diagnosis not present

## 2019-09-14 DIAGNOSIS — B009 Herpesviral infection, unspecified: Secondary | ICD-10-CM | POA: Diagnosis present

## 2019-09-14 DIAGNOSIS — E1165 Type 2 diabetes mellitus with hyperglycemia: Secondary | ICD-10-CM | POA: Diagnosis not present

## 2019-09-14 DIAGNOSIS — J984 Other disorders of lung: Secondary | ICD-10-CM | POA: Diagnosis not present

## 2019-09-14 DIAGNOSIS — R531 Weakness: Secondary | ICD-10-CM | POA: Diagnosis not present

## 2019-09-14 DIAGNOSIS — Z7984 Long term (current) use of oral hypoglycemic drugs: Secondary | ICD-10-CM | POA: Diagnosis not present

## 2019-09-14 DIAGNOSIS — R9431 Abnormal electrocardiogram [ECG] [EKG]: Secondary | ICD-10-CM | POA: Diagnosis not present

## 2019-09-14 DIAGNOSIS — Z03818 Encounter for observation for suspected exposure to other biological agents ruled out: Secondary | ICD-10-CM | POA: Diagnosis not present

## 2019-09-14 DIAGNOSIS — M170 Bilateral primary osteoarthritis of knee: Secondary | ICD-10-CM | POA: Diagnosis not present

## 2019-09-14 DIAGNOSIS — S3993XA Unspecified injury of pelvis, initial encounter: Secondary | ICD-10-CM | POA: Diagnosis not present

## 2019-09-14 LAB — CK: Total CK: 51 U/L (ref 49–397)

## 2019-09-14 LAB — CBC WITH DIFFERENTIAL/PLATELET
Abs Immature Granulocytes: 0.04 10*3/uL (ref 0.00–0.07)
Basophils Absolute: 0 10*3/uL (ref 0.0–0.1)
Basophils Relative: 0 %
Eosinophils Absolute: 0 10*3/uL (ref 0.0–0.5)
Eosinophils Relative: 0 %
HCT: 42.1 % (ref 39.0–52.0)
Hemoglobin: 13 g/dL (ref 13.0–17.0)
Immature Granulocytes: 1 %
Lymphocytes Relative: 41 %
Lymphs Abs: 3.2 10*3/uL (ref 0.7–4.0)
MCH: 31.6 pg (ref 26.0–34.0)
MCHC: 30.9 g/dL (ref 30.0–36.0)
MCV: 102.4 fL — ABNORMAL HIGH (ref 80.0–100.0)
Monocytes Absolute: 0.8 10*3/uL (ref 0.1–1.0)
Monocytes Relative: 11 %
Neutro Abs: 3.6 10*3/uL (ref 1.7–7.7)
Neutrophils Relative %: 47 %
Platelets: 192 10*3/uL (ref 150–400)
RBC: 4.11 MIL/uL — ABNORMAL LOW (ref 4.22–5.81)
RDW: 14.8 % (ref 11.5–15.5)
WBC: 7.6 10*3/uL (ref 4.0–10.5)
nRBC: 0.3 % — ABNORMAL HIGH (ref 0.0–0.2)

## 2019-09-14 LAB — SARS CORONAVIRUS 2 BY RT PCR (HOSPITAL ORDER, PERFORMED IN ~~LOC~~ HOSPITAL LAB): SARS Coronavirus 2: NEGATIVE

## 2019-09-14 LAB — COMPREHENSIVE METABOLIC PANEL
ALT: 40 U/L (ref 0–44)
AST: 46 U/L — ABNORMAL HIGH (ref 15–41)
Albumin: 3.8 g/dL (ref 3.5–5.0)
Alkaline Phosphatase: 34 U/L — ABNORMAL LOW (ref 38–126)
Anion gap: 15 (ref 5–15)
BUN: 22 mg/dL (ref 8–23)
CO2: 21 mmol/L — ABNORMAL LOW (ref 22–32)
Calcium: 8.7 mg/dL — ABNORMAL LOW (ref 8.9–10.3)
Chloride: 110 mmol/L (ref 98–111)
Creatinine, Ser: 1.62 mg/dL — ABNORMAL HIGH (ref 0.61–1.24)
GFR calc Af Amer: 50 mL/min — ABNORMAL LOW (ref >60)
GFR calc non Af Amer: 43 mL/min — ABNORMAL LOW (ref >60)
Glucose, Bld: 354 mg/dL — ABNORMAL HIGH (ref 70–99)
Potassium: 3.4 mmol/L — ABNORMAL LOW (ref 3.5–5.1)
Sodium: 146 mmol/L — ABNORMAL HIGH (ref 135–145)
Total Bilirubin: 0.8 mg/dL (ref 0.3–1.2)
Total Protein: 6.8 g/dL (ref 6.5–8.1)

## 2019-09-14 LAB — TROPONIN I (HIGH SENSITIVITY)
Troponin I (High Sensitivity): 16 ng/L (ref <18)
Troponin I (High Sensitivity): 18 ng/L — ABNORMAL HIGH (ref <18)

## 2019-09-14 LAB — CBG MONITORING, ED: Glucose-Capillary: 225 mg/dL — ABNORMAL HIGH (ref 70–99)

## 2019-09-14 LAB — PHOSPHORUS: Phosphorus: 2.3 mg/dL — ABNORMAL LOW (ref 2.5–4.6)

## 2019-09-14 LAB — MAGNESIUM: Magnesium: 2.1 mg/dL (ref 1.7–2.4)

## 2019-09-14 MED ORDER — AMLODIPINE BESYLATE 5 MG PO TABS
10.0000 mg | ORAL_TABLET | Freq: Every day | ORAL | Status: DC
  Administered 2019-09-15 – 2019-09-17 (×3): 10 mg via ORAL
  Filled 2019-09-14 (×3): qty 2

## 2019-09-14 MED ORDER — ENOXAPARIN SODIUM 40 MG/0.4ML ~~LOC~~ SOLN
40.0000 mg | SUBCUTANEOUS | Status: DC
  Administered 2019-09-14: 40 mg via SUBCUTANEOUS
  Filled 2019-09-14: qty 0.4

## 2019-09-14 MED ORDER — AMIODARONE HCL 200 MG PO TABS
200.0000 mg | ORAL_TABLET | Freq: Every day | ORAL | Status: DC
  Administered 2019-09-15 – 2019-09-17 (×3): 200 mg via ORAL
  Filled 2019-09-14 (×3): qty 1

## 2019-09-14 MED ORDER — POTASSIUM CHLORIDE CRYS ER 20 MEQ PO TBCR
40.0000 meq | EXTENDED_RELEASE_TABLET | Freq: Once | ORAL | Status: AC
  Administered 2019-09-14: 40 meq via ORAL
  Filled 2019-09-14: qty 2

## 2019-09-14 MED ORDER — METOPROLOL TARTRATE 50 MG PO TABS
100.0000 mg | ORAL_TABLET | Freq: Two times a day (BID) | ORAL | Status: DC
  Administered 2019-09-14 – 2019-09-17 (×4): 100 mg via ORAL
  Filled 2019-09-14 (×5): qty 2

## 2019-09-14 MED ORDER — VENETOCLAX 100 MG PO TABS
200.0000 mg | ORAL_TABLET | Freq: Every day | ORAL | Status: DC
  Administered 2019-09-16 – 2019-09-17 (×2): 200 mg via ORAL
  Filled 2019-09-14: qty 1

## 2019-09-14 MED ORDER — LACTATED RINGERS IV SOLN: INTRAVENOUS | Status: DC

## 2019-09-14 MED ORDER — ALLOPURINOL 300 MG PO TABS
300.0000 mg | ORAL_TABLET | Freq: Every day | ORAL | Status: DC
  Administered 2019-09-15 – 2019-09-17 (×3): 300 mg via ORAL
  Filled 2019-09-14 (×5): qty 1

## 2019-09-14 MED ORDER — TAMSULOSIN HCL 0.4 MG PO CAPS
0.4000 mg | ORAL_CAPSULE | Freq: Every day | ORAL | Status: DC
  Administered 2019-09-15 – 2019-09-16 (×2): 0.4 mg via ORAL
  Filled 2019-09-14 (×3): qty 1

## 2019-09-14 MED ORDER — POLYETHYLENE GLYCOL 3350 17 G PO PACK: 17.0000 g | PACK | Freq: Every day | ORAL | Status: DC | PRN

## 2019-09-14 MED ORDER — SODIUM CHLORIDE 0.45 % IV SOLN: INTRAVENOUS | Status: AC

## 2019-09-14 MED ORDER — ACETAMINOPHEN 650 MG RE SUPP: 650.0000 mg | Freq: Four times a day (QID) | RECTAL | Status: DC | PRN

## 2019-09-14 MED ORDER — PROMETHAZINE HCL 12.5 MG PO TABS: 12.5000 mg | ORAL_TABLET | Freq: Four times a day (QID) | ORAL | Status: DC | PRN

## 2019-09-14 MED ORDER — SODIUM CHLORIDE 0.9 % IV BOLUS
1000.0000 mL | Freq: Once | INTRAVENOUS | Status: AC
  Administered 2019-09-14: 1000 mL via INTRAVENOUS

## 2019-09-14 MED ORDER — ACETAMINOPHEN 325 MG PO TABS: 650.0000 mg | ORAL_TABLET | Freq: Four times a day (QID) | ORAL | Status: DC | PRN

## 2019-09-14 MED ORDER — INSULIN ASPART 100 UNIT/ML ~~LOC~~ SOLN
0.0000 [IU] | Freq: Three times a day (TID) | SUBCUTANEOUS | Status: DC
  Administered 2019-09-15: 3 [IU] via SUBCUTANEOUS
  Administered 2019-09-15 (×2): 5 [IU] via SUBCUTANEOUS
  Administered 2019-09-16: 2 [IU] via SUBCUTANEOUS
  Administered 2019-09-16: 5 [IU] via SUBCUTANEOUS
  Administered 2019-09-16: 7 [IU] via SUBCUTANEOUS
  Administered 2019-09-17 (×2): 3 [IU] via SUBCUTANEOUS

## 2019-09-14 MED ORDER — ATORVASTATIN CALCIUM 40 MG PO TABS
80.0000 mg | ORAL_TABLET | Freq: Every day | ORAL | Status: DC
  Administered 2019-09-15 – 2019-09-17 (×3): 80 mg via ORAL
  Filled 2019-09-14 (×3): qty 2

## 2019-09-14 NOTE — ED Provider Notes (Signed)
[facility] EMERGENCY DEPARTMENT Provider Note   CSN: [id number] Arrival date & time: 09/14/19  1233     History Chief Complaint  Patient presents with  . Fall    Tim Duncan is a 69 y.o. male.  HPI    69 year old male comes in a chief complaint of fall. Patient has history of CLL, hypertension, borderline diabetes.  Patient reports that he had a fall at his home on Wednesday after he went to PCP.  Patient's leg just gave out.  He could not get up and was on the floor until today when the neighbor came checked on him.  Patient has not had anything to eat or drink since then.  He lives in a mobile home by himself.  Patient is not on any blood thinners.  He is not having pain anywhere right now besides his leg.  Patient denies any loss of strength, just reports that his left leg has intermittently been giving out on him.  Past Medical History:  Diagnosis Date  . Anemia 08/26/2012  . BPH (benign prostatic hyperplasia)   . Cancer ([place])    cll  . CLL (chronic lymphocytic leukemia) ([place])   . Hypertension    off meds  . Lymphadenopathy   . Lymphocytosis   . Thrombocytosis ([place])   . Tremors of nervous system   . Wears dentures    top    Patient Active Problem List   Diagnosis Date Noted  . Varicose veins of lower extremities with complications 57/84/6962  . Chronic venous insufficiency 06/23/2014  . Multiple contusions 06/23/2014  . Synovial cyst 12/15/2013  . CLL (chronic lymphocytic leukemia) ([place])   . Lymphadenopathy   . Lymphocytosis   . Thrombocytosis ([place])   . Anemia 08/26/2012    Past Surgical History:  Procedure Laterality Date  . CATARACT EXTRACTION W/PHACO Right 06/21/2014   Procedure: CATARACT EXTRACTION PHACO AND INTRAOCULAR LENS PLACEMENT; CDE:  11.10;  Surgeon: Rutherford Guys, MD;  Location: [facility];  Service: Ophthalmology;  Laterality: Right;  . CATARACT EXTRACTION W/PHACO Left 07/12/2014   Procedure: CATARACT EXTRACTION PHACO AND INTRAOCULAR LENS  PLACEMENT (IOC);  Surgeon: Rutherford Guys, MD;  Location: [facility];  Service: Ophthalmology;  Laterality: Left;  CDE:7.52  . ENDOVENOUS ABLATION SAPHENOUS VEIN W/ LASER Right 12-08-2014   endovenous laser ablation (right greater saphenous vein) by Curt Jews MD   . FRACTURE SURGERY  1990   rt ring finger  . HERNIA REPAIR  1992   umb  . ORIF ANKLE FRACTURE  1995   right  . PORTACATH PLACEMENT  3/14  . SYNOVECTOMY Right 12/15/2013   Procedure: RIGHT RING FINGER FLEXOR SYNOVECTOMY;  Surgeon: Charlotte Crumb, MD;  Location: [facility];  Service: Orthopedics;  Laterality: Right;  . TONSILLECTOMY         Family History  Problem Relation Age of Onset  . Diabetes Mother     Social History   Tobacco Use  . Smoking status: Former Smoker    Packs/day: 2.00    Years: 20.00    Pack years: 40.00    Types: Cigarettes    Quit date: 12/13/1988    Years since quitting: 30.7  . Smokeless tobacco: Never Used  Substance Use Topics  . Alcohol use: No  . Drug use: No    Home Medications Prior to Admission medications   Medication Sig Start Date End Date Taking? Authorizing Provider  allopurinol (ZYLOPRIM) 300 MG tablet Take 300 mg by mouth daily.  Yes [provider]  amiodarone (PACERONE) 200 MG tablet Take 200 mg by mouth daily. 08/19/19  Yes [provider]  amLODipine (NORVASC) 5 MG tablet Take 10 mg by mouth daily.    Yes [provider]  atorvastatin (LIPITOR) 80 MG tablet Take 80 mg by mouth daily. 08/19/19  Yes [provider]  cyanocobalamin (,VITAMIN B-12,) 1000 MCG/ML injection INJECT 1 ML INTO THE MUSCLE MONTHLY UNTIL FURTHER NOTICE FROM ELLIS MD 09/02/19  Yes [provider]  fenofibrate 160 MG tablet Take 160 mg by mouth daily.  08/19/19  Yes [provider]  irbesartan (AVAPRO) 300 MG tablet Take 300 mg by mouth daily.   Yes [provider]  meloxicam (MOBIC) 7.5 MG tablet Take 1 tablet (7.5 mg total) by  mouth daily. 09/09/19  Yes Virgel Manifold, MD  metFORMIN (GLUCOPHAGE) 1000 MG tablet Take 1,000 mg by mouth 2 (two) times daily. 08/19/19  Yes [provider]  metoprolol tartrate (LOPRESSOR) 100 MG tablet Take 100 mg by mouth 2 (two) times daily. 08/19/19  Yes [provider]  Multiple Vitamin (MULTIVITAMIN) tablet Take 1 tablet by mouth daily.   Yes [provider]  tamsulosin (FLOMAX) 0.4 MG CAPS capsule Take 0.4 mg by mouth daily after supper.   Yes [provider]  valACYclovir (VALTREX) 1000 MG tablet Take 1,000 mg by mouth daily as needed (outbreak).  07/20/19  Yes [provider]  venetoclax 100 MG TABS Take 200 mGy by mouth daily.  07/26/19  Yes [provider]    Allergies    Patient has no known allergies.  Review of Systems   Review of Systems  Constitutional: Positive for activity change.  Respiratory: Negative for shortness of breath.   Cardiovascular: Negative for chest pain.  Gastrointestinal: Negative for nausea and vomiting.  Allergic/Immunologic: Negative for immunocompromised state.  Hematological: Does not bruise/bleed easily.  All other systems reviewed and are negative.   Physical Exam Updated Vital Signs BP (!) 162/94   Pulse 78   Temp 98.1 F (36.7 C) (Oral)   Resp (!) 27   Ht 6' (1.829 m)   Wt 134 kg   SpO2 96%   BMI 40.07 kg/m   Physical Exam Vitals and nursing note reviewed.  Constitutional:      Appearance: He is well-developed.  HENT:     Head: Normocephalic and atraumatic.  Eyes:     Conjunctiva/sclera: Conjunctivae normal.     Pupils: Pupils are equal, round, and reactive to light.  Cardiovascular:     Rate and Rhythm: Normal rate and regular rhythm.  Pulmonary:     Effort: Pulmonary effort is normal.     Breath sounds: Normal breath sounds.  Abdominal:     General: Bowel sounds are normal. There is no distension.     Palpations: Abdomen is soft. There is no mass.     Tenderness:  There is no abdominal tenderness. There is no guarding or rebound.  Musculoskeletal:        General: No deformity.     Cervical back: Normal range of motion and neck supple.     Comments: Head to toe evaluation shows no hematoma, bleeding of the scalp, no facial abrasions, no spine step offs, crepitus of the chest or neck, no tenderness to palpation of the bilateral upper and lower extremities, no gross deformities, no chest tenderness, no pelvic pain.   Skin:    General: Skin is warm.  Neurological:     Mental  Status: He is alert and oriented to person, place, and time.     Cranial Nerves: No cranial nerve deficit.     Sensory: No sensory deficit.     Motor: No weakness.     ED Results / Procedures / Treatments   Labs (all labs ordered are listed, but only abnormal results are displayed) Labs Reviewed  COMPREHENSIVE METABOLIC PANEL - Abnormal; Notable for the following components:      Result Value   Sodium 146 (*)    Potassium 3.4 (*)    CO2 21 (*)    Glucose, Bld 354 (*)    Creatinine, Ser 1.62 (*)    Calcium 8.7 (*)    AST 46 (*)    Alkaline Phosphatase 34 (*)    GFR calc non Af Amer 43 (*)    GFR calc Af Amer 50 (*)    All other components within normal limits  CBC WITH DIFFERENTIAL/PLATELET - Abnormal; Notable for the following components:   RBC 4.11 (*)    MCV 102.4 (*)    nRBC 0.3 (*)    All other components within normal limits  PHOSPHORUS - Abnormal; Notable for the following components:   Phosphorus 2.3 (*)    All other components within normal limits  SARS CORONAVIRUS 2 BY RT PCR (HOSPITAL ORDER, [place] [facility])  CK  MAGNESIUM  PATHOLOGIST SMEAR REVIEW    EKG EKG Interpretation  Date/Time:  Tuesday September 14 2019 13:02:54 EDT Ventricular Rate:  96 PR Interval:    QRS Duration: 86 QT Interval:  384 QTC Calculation: 486 R Axis:   -16 Text Interpretation: Sinus rhythm Borderline left axis deviation Borderline low voltage,  extremity leads Borderline repolarization abnormality Borderline prolonged QT interval TWI are new in lateral leads Nonspecific ST and T wave abnormality Confirmed by Varney Biles [phone]) on 09/14/2019 2:24:31 PM   Radiology No results found.  Procedures Procedures (including critical care time)  Medications Ordered in ED Medications  lactated ringers infusion ( Intravenous New Bag/Given 09/14/19 1421)  sodium chloride 0.9 % bolus 1,000 mL (1,000 mLs Intravenous New Bag/Given 09/14/19 1421)    ED Course  I have reviewed the triage vital signs and the nursing notes.  Pertinent labs & imaging results that were available during my care of the patient were reviewed by me and considered in my medical decision making (see chart for details).    MDM Rules/Calculators/A&P                          69 year old with CLL comes in a chief complaint of fall.  He reports that he had a fall 5 days ago and had been on the floor since then.  Initial concerns were for rhabdo and significant electrolyte abnormality and renal failure. Labs overall are reassuring however.  I reassessed the patient and informed him about the results in the ED.   The plan is for patient to be ambulated and consulting transition of care service and physical therapy.  Patient is comfortable going home if he had home health.  He will be discharged if he is able to walk.  We also advised patient PCP follow-up for further evaluation of weakness/legs giving out.  Final Clinical Impression(s) / ED Diagnoses Final diagnoses:  Fall, initial encounter    Rx / DC Orders ED Discharge Orders    None       Varney Biles, MD 09/14/19 1612

## 2019-09-14 NOTE — Discharge Instructions (Addendum)
  All the results in the ER are normal, labs and imaging. We are not sure what is causing your symptoms.  It is prudent that you follow-up with your primary care doctor for further evaluation of the reason behind the legs giving out.  The workup in the ER is not complete, and is limited to screening for life threatening and emergent conditions only, so please see a primary care doctor for further evaluation.

## 2019-09-14 NOTE — ED Notes (Signed)
Attempt to call report x1 rn will call back

## 2019-09-14 NOTE — TOC Initial Note (Addendum)
Transition of Care [facility]) - Initial/Assessment Note   Patient Details  Name: Tim Duncan MRN: [id number] Date of Birth: 1950-05-22  Transition of Care [facility]) CM/SW Contact:    Sherie Don, LCSW Phone Number: 09/14/2019, 7:30 PM  Clinical Narrative: Patient is a 69 year old male who presented to the ED for a fall and was admitted for AKI. Patient was seen 5 days ago in the ED for knee pain at which time he was discharged home with a walker. TOC received consult for HH/DME needs, however, per conversation with EDP, it was recommended that patient be referred for SNF. Patient agreeable to SNF. CSW met with patient in ED to discuss SNF options. Patient requested [facility] as his first choice and requested the referral be sent to only [facility]. PASRR completed: [id number] A. FL2 started and is pending PT evaluation recommendations.  Expected Discharge Plan: Skilled Nursing Facility Barriers to Discharge: Continued Medical Work up  Patient Goals and CMS Choice Patient states their goals for this hospitalization and ongoing recovery are:: Discharge to SNF for rehab CMS Medicare.gov Compare Post Acute Care list provided to:: Patient Choice offered to / list presented to : Patient  Expected Discharge Plan and Services Expected Discharge Plan: [place] In-house Referral: Clinical Social Work Discharge Planning Services: NA Post Acute Care Choice: [place] Living arrangements for the past 2 months: Mobile Home         DME Arranged: N/A DME Agency: NA HH Arranged: NA [place] Agency: NA  Prior Living Arrangements/Services Living arrangements for the past 2 months: Mobile Home Lives with:: Self Patient language and need for interpreter reviewed:: Yes Do you feel safe going back to the place where you live?: Yes (Patient reported he feels safe once he has received rehab)     Need for Family Participation in Patient Care: No (Comment) Care giver support  system in place?: Yes (comment) Current home services: DME [facility]) Criminal Activity/Legal Involvement Pertinent to Current Situation/Hospitalization: No - Comment as needed  Permission Sought/Granted Permission sought to share information with : Facility Sport and exercise psychologist (SNF placements)  Emotional Assessment Appearance:: Appears stated age Attitude/Demeanor/Rapport: Engaged Affect (typically observed): Accepting Orientation: : Oriented to Self, Oriented to Place, Oriented to  Time, Oriented to Situation Alcohol / Substance Use: Not Applicable Psych Involvement: No (comment)  Admission diagnosis:  AKI (acute kidney injury) ([place]) [N17.9] Patient Active Problem List   Diagnosis Date Noted  . AKI (acute kidney injury) ([place]) 09/14/2019  . Fall 09/14/2019  . Atrial fibrillation ([place]) 09/14/2019  . Varicose veins of lower extremities with complications 95/11/3265  . Chronic venous insufficiency 06/23/2014  . Multiple contusions 06/23/2014  . Synovial cyst 12/15/2013  . CLL (chronic lymphocytic leukemia) ([place])   . Lymphadenopathy   . Lymphocytosis   . Thrombocytosis ([place])   . Anemia 08/26/2012   PCP:  Celene Squibb, MD Pharmacy:   [place] [facility], [place] - [address] [place] [address] [place] [address] Phone: [phone] Fax: [phone]  [facility], [place], [place] - [address] [place] [address] [place] [address] Phone: [phone] Fax: [facility], [address] [place] [address] Phone: [phone] Fax: [phone]  [place], [place] [address] [place], [address] [place] [address] Phone: [phone] Fax: [phone]  Readmission Risk Interventions No flowsheet data found.  

## 2019-09-14 NOTE — ED Triage Notes (Signed)
EMS reports pt fell Tuesday and went to pcp.  Reports fell again Wednesday and says has been laying in his floor since Wednesday.  Pt denies any pain.  Pt lives at home alone.  EMS says is referring pt to APS.  Pt alert and oriented per ems but says seems confused at times.

## 2019-09-14 NOTE — ED Notes (Signed)
Pt given fluids to drink and has tolerated them well.

## 2019-09-14 NOTE — H&P (Signed)
History and Physical    Tim Duncan BHA:[id number] DOB: June 20, 1950 DOA: 09/14/2019  PCP: Celene Squibb, MD   Patient coming from: Home  I have personally briefly reviewed patient's old medical records in [place]  Chief Complaint: Fall  HPI: Tim Duncan is a 69 y.o. male with medical history significant for chronic lymphocytic leukemia, hypertension, atrial fibrillation, BPH. Patient presented to the ED with reports of 2 falls.  First fall was on Tuesday, 6/8, and then he fell again the next day wednesday, and he remained on the floor  (for 7 days) till today when a neighbor came to check on him.  He lives in a mobile home by himself. He tells me his 2 legs just felt weak and buckled.  He denies prior falls.   He remained conscious the whole time-prior to, during and after the fall.  He did not hit his head.  He is not on anticoagulation.  He denies chest pains, no difficulty breathing.  Reports chronic unchanged back pain only exertion. No fever no chills, no cough, no vomiting no loose stools, no pain with urination.  ED Course: Blood pressure systolic 973Z to 329J, sodium 146.  Potassium 3.4.  Creatinine elevated 1.62.  CK 51. Pelvic X-ray without fracture.  Two-view chest x-ray without acute abnormality.  1 L bolus normal saline given.  Hospitalist to admit for acute kidney injury, falls.  Review of Systems: As per HPI all other systems reviewed and negative.  Past Medical History:  Diagnosis Date  . Anemia 08/26/2012  . BPH (benign prostatic hyperplasia)   . Cancer ([place])    cll  . CLL (chronic lymphocytic leukemia) ([place])   . Hypertension    off meds  . Lymphadenopathy   . Lymphocytosis   . Thrombocytosis ([place])   . Tremors of nervous system   . Wears dentures    top    Past Surgical History:  Procedure Laterality Date  . CATARACT EXTRACTION W/PHACO Right 06/21/2014   Procedure: CATARACT EXTRACTION PHACO AND INTRAOCULAR LENS PLACEMENT; CDE:  11.10;  Surgeon: Rutherford Guys, MD;  Location: [facility];  Service: Ophthalmology;  Laterality: Right;  . CATARACT EXTRACTION W/PHACO Left 07/12/2014   Procedure: CATARACT EXTRACTION PHACO AND INTRAOCULAR LENS PLACEMENT (IOC);  Surgeon: Rutherford Guys, MD;  Location: [facility];  Service: Ophthalmology;  Laterality: Left;  CDE:7.52  . ENDOVENOUS ABLATION SAPHENOUS VEIN W/ LASER Right 12-08-2014   endovenous laser ablation (right greater saphenous vein) by Curt Jews MD   . FRACTURE SURGERY  1990   rt ring finger  . HERNIA REPAIR  1992   umb  . ORIF ANKLE FRACTURE  1995   right  . PORTACATH PLACEMENT  3/14  . SYNOVECTOMY Right 12/15/2013   Procedure: RIGHT RING FINGER FLEXOR SYNOVECTOMY;  Surgeon: Charlotte Crumb, MD;  Location: [facility];  Service: Orthopedics;  Laterality: Right;  . TONSILLECTOMY       reports that he quit smoking about 30 years ago. His smoking use included cigarettes. He has a 40.00 pack-year smoking history. He has never used smokeless tobacco. He reports that he does not drink alcohol and does not use drugs.  No Known Allergies  Family History  Problem Relation Age of Onset  . Diabetes Mother     Prior to Admission medications   Medication Sig Start Date End Date Taking? Authorizing Provider  allopurinol (ZYLOPRIM) 300 MG tablet Take 300 mg by mouth daily.   Yes [provider]  amiodarone (PACERONE) 200 MG tablet Take 200 mg by mouth daily. 08/19/19  Yes [provider]  amLODipine (NORVASC) 5 MG tablet Take 10 mg by mouth daily.    Yes [provider]  atorvastatin (LIPITOR) 80 MG tablet Take 80 mg by mouth daily. 08/19/19  Yes [provider]  cyanocobalamin (,VITAMIN B-12,) 1000 MCG/ML injection INJECT 1 ML INTO THE MUSCLE MONTHLY UNTIL FURTHER NOTICE FROM ELLIS MD 09/02/19  Yes [provider]  fenofibrate 160 MG tablet Take 160 mg by mouth daily.  08/19/19  Yes [provider]  irbesartan (AVAPRO) 300 MG tablet Take 300 mg  by mouth daily.   Yes [provider]  meloxicam (MOBIC) 7.5 MG tablet Take 1 tablet (7.5 mg total) by mouth daily. 09/09/19  Yes Virgel Manifold, MD  metFORMIN (GLUCOPHAGE) 1000 MG tablet Take 1,000 mg by mouth 2 (two) times daily. 08/19/19  Yes [provider]  metoprolol tartrate (LOPRESSOR) 100 MG tablet Take 100 mg by mouth 2 (two) times daily. 08/19/19  Yes [provider]  Multiple Vitamin (MULTIVITAMIN) tablet Take 1 tablet by mouth daily.   Yes [provider]  tamsulosin (FLOMAX) 0.4 MG CAPS capsule Take 0.4 mg by mouth daily after supper.   Yes [provider]  valACYclovir (VALTREX) 1000 MG tablet Take 1,000 mg by mouth daily as needed (outbreak).  07/20/19  Yes [provider]  venetoclax 100 MG TABS Take 200 mGy by mouth daily.  07/26/19  Yes [provider]    Physical Exam: Vitals:   09/14/19 1600 09/14/19 1630 09/14/19 1700 09/14/19 1730  BP: (!) 146/85 118/77 134/89 (!) 146/85  Pulse: 80 78 74 70  Resp:   (!) 25 (!) 23  Temp:      TempSrc:      SpO2: 97% 97% 96% 94%  Weight:      Height:        Constitutional: NAD, calm, comfortable Vitals:   09/14/19 1600 09/14/19 1630 09/14/19 1700 09/14/19 1730  BP: (!) 146/85 118/77 134/89 (!) 146/85  Pulse: 80 78 74 70  Resp:   (!) 25 (!) 23  Temp:      TempSrc:      SpO2: 97% 97% 96% 94%  Weight:      Height:       Eyes: PERRL, lids and conjunctivae normal ENMT: Mucous membranes are moist. Posterior pharynx clear of any exudate or lesions.  Neck: normal, supple, no masses, no thyromegaly Respiratory: clear to auscultation bilaterally, no wheezing, no crackles. Normal respiratory effort. No accessory muscle use.  Cardiovascular: Regular rate and rhythm, no murmurs / rubs / gallops. No extremity edema. 2+ pedal pulses.   Abdomen: Full, with hernia, no tenderness, no masses palpated. No hepatosplenomegaly. Bowel sounds positive.  Musculoskeletal: no clubbing /  cyanosis. No joint deformity upper and lower extremities. Good ROM, no contractures. Normal muscle tone.  Skin: no rashes, lesions, ulcers. No induration Neurologic: 4+/5 strength in bilateral lower extremities, no apparent cranial nerve abnormality, 4+55 strength bilateral upper extremities. Psychiatric: Normal judgment and insight. Alert and oriented x 3. Normal mood.   Labs on Admission: I have personally reviewed following labs and imaging studies  CBC: Recent Labs  Lab 09/14/19 1404  WBC 7.6  NEUTROABS 3.6  HGB 13.0  HCT 42.1  MCV 102.4*  PLT 267   Basic Metabolic Panel: Recent Labs  Lab 09/14/19 1404  NA 146*  K 3.4*  CL 110  CO2 21*  GLUCOSE 354*  BUN 22  CREATININE 1.62*  CALCIUM 8.7*  MG 2.1  PHOS 2.3*   Liver Function Tests: Recent Labs  Lab 09/14/19 1404  AST 46*  ALT 40  ALKPHOS 34*  BILITOT 0.8  PROT 6.8  ALBUMIN 3.8   Cardiac Enzymes: Recent Labs  Lab 09/14/19 1404  CKTOTAL 51    Radiological Exams on Admission: DG Chest 2 View  Result Date: 09/14/2019 CLINICAL DATA:  Status post fall. EXAM: CHEST - 2 VIEW COMPARISON:  June 08, 2012. FINDINGS: Stable cardiomegaly. No pneumothorax or pleural effusion is noted. Both lungs are clear. The visualized skeletal structures are unremarkable. IMPRESSION: No active cardiopulmonary disease. Electronically Signed   By: Marijo Conception M.D.   On: 09/14/2019 17:10   DG Pelvis 1-2 Views  Result Date: 09/14/2019 CLINICAL DATA:  Status post fall. EXAM: PELVIS - 1-2 VIEW COMPARISON:  None. FINDINGS: There is no evidence of pelvic fracture or diastasis. No pelvic bone lesions are seen. IMPRESSION: Negative. Electronically Signed   By: Marijo Conception M.D.   On: 09/14/2019 17:11    EKG: Independently reviewed.  Sinus rhythm rate 96.  T wave inversions in leads II, III, aVF, V3 through V6 which are new.  QTc of 486.  Assessment/Plan Principal Problem:   AKI (acute kidney injury) ([place]) Active Problems:   CLL  (chronic lymphocytic leukemia) ([place])   Fall   Atrial fibrillation ([place])  Acute kidney injury-creatinine 1.62, baseline 0.9-1.1.  Likely prerenal from dehydration, unable to take p.o. due to fall.  CK  WNL  51. -500 ml Bolus given, continue 1/2 N/s 100cc/hr x 1 day -BMP in a.m - Hold irbesartan  Hypernatremia, hypokalemia- Na- 146, K 3.4,  likely from dehydration.  Normal magnesium 1.9 -Replete  Falls-mechanical.  Did not hit his head.  No loss of consciousness.  Anticoagulation. -PT evaluation, SW consulted -May need placement.  Abnormal EKG-T wave inversions in leads II, III, aVF, V3 through V6.  Chest pain no difficulty breathing. -Check troponin -Repeat EKG in the morning  Chronic lymphocytic leukemia- follows with hematologist at [facility] home venetoclax. -He is on Valtrex for herpes, he reports he takes this as needed.  History of atrial fibrillation-developed when he was critically ill as a complication of chemotherapy.  Followed with cardiology at [facility], not on anticoagulation.  It appears his amiodarone was discontinued at some point, but patient tells me he is still taking it. -Resume home amiodarone, metoprolol.   HTN-elevated. - Resume metoprolol, Norvasc.  - Hold irbesartan  Hyperglycemia-glucose 354 - Check Hgba1c - SSi-s   DVT prophylaxis: Lovenox Code Status: Full code Family Communication: None at bedside Disposition Plan: 1 to 2 days Consults called: None Admission status: Obs, telemetry   Bethena Roys MD [facility]  09/14/2019, 8:36 PM

## 2019-09-14 NOTE — ED Notes (Signed)
Pt ambulated to bathroom and back well with assistance of this NT and NT [employer].

## 2019-09-15 DIAGNOSIS — R296 Repeated falls: Secondary | ICD-10-CM | POA: Diagnosis present

## 2019-09-15 DIAGNOSIS — Z961 Presence of intraocular lens: Secondary | ICD-10-CM | POA: Diagnosis present

## 2019-09-15 DIAGNOSIS — B009 Herpesviral infection, unspecified: Secondary | ICD-10-CM | POA: Diagnosis present

## 2019-09-15 DIAGNOSIS — Z20822 Contact with and (suspected) exposure to covid-19: Secondary | ICD-10-CM | POA: Diagnosis not present

## 2019-09-15 DIAGNOSIS — Z79899 Other long term (current) drug therapy: Secondary | ICD-10-CM | POA: Diagnosis not present

## 2019-09-15 DIAGNOSIS — Z9841 Cataract extraction status, right eye: Secondary | ICD-10-CM | POA: Diagnosis not present

## 2019-09-15 DIAGNOSIS — M6281 Muscle weakness (generalized): Secondary | ICD-10-CM | POA: Diagnosis not present

## 2019-09-15 DIAGNOSIS — I4891 Unspecified atrial fibrillation: Secondary | ICD-10-CM | POA: Diagnosis not present

## 2019-09-15 DIAGNOSIS — Z7401 Bed confinement status: Secondary | ICD-10-CM | POA: Diagnosis not present

## 2019-09-15 DIAGNOSIS — I480 Paroxysmal atrial fibrillation: Secondary | ICD-10-CM | POA: Diagnosis not present

## 2019-09-15 DIAGNOSIS — C9110 Chronic lymphocytic leukemia of B-cell type not having achieved remission: Secondary | ICD-10-CM | POA: Diagnosis not present

## 2019-09-15 DIAGNOSIS — M170 Bilateral primary osteoarthritis of knee: Secondary | ICD-10-CM | POA: Diagnosis not present

## 2019-09-15 DIAGNOSIS — I10 Essential (primary) hypertension: Secondary | ICD-10-CM | POA: Diagnosis not present

## 2019-09-15 DIAGNOSIS — Z856 Personal history of leukemia: Secondary | ICD-10-CM | POA: Diagnosis not present

## 2019-09-15 DIAGNOSIS — Z9842 Cataract extraction status, left eye: Secondary | ICD-10-CM | POA: Diagnosis not present

## 2019-09-15 DIAGNOSIS — G250 Essential tremor: Secondary | ICD-10-CM | POA: Diagnosis not present

## 2019-09-15 DIAGNOSIS — E860 Dehydration: Secondary | ICD-10-CM | POA: Diagnosis present

## 2019-09-15 DIAGNOSIS — E1165 Type 2 diabetes mellitus with hyperglycemia: Secondary | ICD-10-CM | POA: Diagnosis present

## 2019-09-15 DIAGNOSIS — N179 Acute kidney failure, unspecified: Secondary | ICD-10-CM | POA: Diagnosis not present

## 2019-09-15 DIAGNOSIS — E118 Type 2 diabetes mellitus with unspecified complications: Secondary | ICD-10-CM | POA: Diagnosis not present

## 2019-09-15 DIAGNOSIS — N400 Enlarged prostate without lower urinary tract symptoms: Secondary | ICD-10-CM | POA: Diagnosis present

## 2019-09-15 DIAGNOSIS — Z7984 Long term (current) use of oral hypoglycemic drugs: Secondary | ICD-10-CM | POA: Diagnosis not present

## 2019-09-15 DIAGNOSIS — Z87891 Personal history of nicotine dependence: Secondary | ICD-10-CM | POA: Diagnosis not present

## 2019-09-15 DIAGNOSIS — E871 Hypo-osmolality and hyponatremia: Secondary | ICD-10-CM | POA: Diagnosis present

## 2019-09-15 DIAGNOSIS — E861 Hypovolemia: Secondary | ICD-10-CM | POA: Diagnosis present

## 2019-09-15 DIAGNOSIS — R69 Illness, unspecified: Secondary | ICD-10-CM | POA: Diagnosis not present

## 2019-09-15 DIAGNOSIS — E876 Hypokalemia: Secondary | ICD-10-CM | POA: Diagnosis not present

## 2019-09-15 DIAGNOSIS — E870 Hyperosmolality and hypernatremia: Secondary | ICD-10-CM | POA: Diagnosis not present

## 2019-09-15 DIAGNOSIS — W19XXXA Unspecified fall, initial encounter: Secondary | ICD-10-CM | POA: Diagnosis not present

## 2019-09-15 DIAGNOSIS — R9431 Abnormal electrocardiogram [ECG] [EKG]: Secondary | ICD-10-CM | POA: Diagnosis not present

## 2019-09-15 DIAGNOSIS — Y92009 Unspecified place in unspecified non-institutional (private) residence as the place of occurrence of the external cause: Secondary | ICD-10-CM | POA: Diagnosis not present

## 2019-09-15 DIAGNOSIS — E785 Hyperlipidemia, unspecified: Secondary | ICD-10-CM | POA: Diagnosis present

## 2019-09-15 DIAGNOSIS — Z6841 Body Mass Index (BMI) 40.0 and over, adult: Secondary | ICD-10-CM | POA: Diagnosis not present

## 2019-09-15 DIAGNOSIS — Z743 Need for continuous supervision: Secondary | ICD-10-CM | POA: Diagnosis not present

## 2019-09-15 DIAGNOSIS — R262 Difficulty in walking, not elsewhere classified: Secondary | ICD-10-CM | POA: Diagnosis not present

## 2019-09-15 LAB — TROPONIN I (HIGH SENSITIVITY)
Troponin I (High Sensitivity): 15 ng/L (ref <18)
Troponin I (High Sensitivity): 15 ng/L (ref <18)

## 2019-09-15 LAB — BASIC METABOLIC PANEL
Anion gap: 13 (ref 5–15)
BUN: 17 mg/dL (ref 8–23)
CO2: 21 mmol/L — ABNORMAL LOW (ref 22–32)
Calcium: 7.7 mg/dL — ABNORMAL LOW (ref 8.9–10.3)
Chloride: 105 mmol/L (ref 98–111)
Creatinine, Ser: 1.1 mg/dL (ref 0.61–1.24)
GFR calc Af Amer: >60 mL/min (ref >60)
GFR calc non Af Amer: >60 mL/min (ref >60)
Glucose, Bld: 239 mg/dL — ABNORMAL HIGH (ref 70–99)
Potassium: 3.5 mmol/L (ref 3.5–5.1)
Sodium: 139 mmol/L (ref 135–145)

## 2019-09-15 LAB — LIPID PANEL
Cholesterol: 313 mg/dL — ABNORMAL HIGH (ref 0–200)
HDL: 28 mg/dL — ABNORMAL LOW (ref >40)
LDL Cholesterol: UNDETERMINED mg/dL (ref 0–99)
Total CHOL/HDL Ratio: 11.2 RATIO
Triglycerides: 640 mg/dL — ABNORMAL HIGH (ref <150)
VLDL: UNDETERMINED mg/dL (ref 0–40)

## 2019-09-15 LAB — GLUCOSE, CAPILLARY
Glucose-Capillary: 214 mg/dL — ABNORMAL HIGH (ref 70–99)
Glucose-Capillary: 265 mg/dL — ABNORMAL HIGH (ref 70–99)
Glucose-Capillary: 229 mg/dL — ABNORMAL HIGH (ref 70–99)
Glucose-Capillary: 261 mg/dL — ABNORMAL HIGH (ref 70–99)

## 2019-09-15 LAB — PATHOLOGIST SMEAR REVIEW

## 2019-09-15 LAB — HIV ANTIBODY (ROUTINE TESTING W REFLEX): HIV Screen 4th Generation wRfx: NONREACTIVE

## 2019-09-15 MED ORDER — IRBESARTAN 150 MG PO TABS
300.0000 mg | ORAL_TABLET | Freq: Every day | ORAL | Status: DC
  Administered 2019-09-16 – 2019-09-17 (×2): 300 mg via ORAL
  Filled 2019-09-15 (×2): qty 2

## 2019-09-15 MED ORDER — INSULIN GLARGINE 100 UNIT/ML ~~LOC~~ SOLN
20.0000 [IU] | Freq: Every day | SUBCUTANEOUS | Status: DC
  Administered 2019-09-15 – 2019-09-16 (×2): 20 [IU] via SUBCUTANEOUS
  Filled 2019-09-15 (×3): qty 0.2

## 2019-09-15 MED ORDER — INSULIN STARTER KIT- PEN NEEDLES (ENGLISH)
1.0000 | Freq: Once | Status: DC
  Filled 2019-09-15: qty 1

## 2019-09-15 MED ORDER — LIVING WELL WITH DIABETES BOOK: Freq: Once | Status: AC

## 2019-09-15 MED ORDER — CHLORHEXIDINE GLUCONATE CLOTH 2 % EX PADS
6.0000 | MEDICATED_PAD | Freq: Every day | CUTANEOUS | Status: DC
  Administered 2019-09-15 – 2019-09-16 (×2): 6 via TOPICAL

## 2019-09-15 MED ORDER — ASPIRIN EC 81 MG PO TBEC
81.0000 mg | DELAYED_RELEASE_TABLET | Freq: Every day | ORAL | Status: DC
  Administered 2019-09-15 – 2019-09-17 (×3): 81 mg via ORAL
  Filled 2019-09-15 (×3): qty 1

## 2019-09-15 MED ORDER — ENOXAPARIN SODIUM 80 MG/0.8ML ~~LOC~~ SOLN
65.0000 mg | SUBCUTANEOUS | Status: DC
  Administered 2019-09-15 – 2019-09-16 (×2): 65 mg via SUBCUTANEOUS
  Filled 2019-09-15 (×2): qty 0.8

## 2019-09-15 NOTE — Plan of Care (Signed)
  Problem: Acute Rehab PT Goals(only PT should resolve) Goal: Pt Will Go Supine/Side To Sit Outcome: Progressing Flowsheets (Taken 09/15/2019 1445) Pt will go Supine/Side to Sit:  with modified independence  with supervision Goal: Patient Will Transfer Sit To/From Stand Outcome: Progressing Flowsheets (Taken 09/15/2019 1445) Patient will transfer sit to/from stand:  with min guard assist  with supervision Goal: Pt Will Transfer Bed To Chair/Chair To Bed Outcome: Progressing Flowsheets (Taken 09/15/2019 1445) Pt will Transfer Bed to Chair/Chair to Bed:  min guard assist  with supervision Goal: Pt Will Ambulate Outcome: Progressing Flowsheets (Taken 09/15/2019 1445) Pt will Ambulate:  75 feet  with min guard assist  with rolling walker   2:45 PM, 09/15/19 Lonell Grandchild, MPT Physical Therapist with [facility] [phone] office [phone] mobile phone

## 2019-09-15 NOTE — Progress Notes (Signed)
PROGRESS NOTE    Tim Duncan  ZJQ:[id number] DOB: October 27, 1950 DOA: 09/14/2019 PCP: Celene Squibb, MD    Brief Narrative:  Tim Duncan is a 69 y.o. male with medical history significant for chronic lymphocytic leukemia, hypertension, atrial fibrillation, BPH. Patient presented to the ED with reports of 2 falls.  First fall was on Tuesday, 6/8, and then he fell again the next day wednesday, and he remained on the floor  (for 7 days) till today when a neighbor came to check on him.  He lives in a mobile home by himself. He tells me his 2 legs just felt weak and buckled.  He denies prior falls.   He remained conscious the whole time-prior to, during and after the fall.  He did not hit his head.  He is not on anticoagulation.  He denies chest pains, no difficulty breathing.  Reports chronic unchanged back pain only exertion. No fever no chills, no cough, no vomiting no loose stools, no pain with urination.  In the Ed, blood pressure systolic 240X to 735H, sodium 146.  Potassium 3.4.  Creatinine elevated 1.62. CK 51. Pelvic X-ray without fracture.  Two-view chest x-ray without acute abnormality.  1 L bolus normal saline given.  Hospitalist to admit for acute kidney injury, falls.   Assessment & Plan:   Principal Problem:   AKI (acute kidney injury) ([place]) Active Problems:   CLL (chronic lymphocytic leukemia) ([place])   Fall   Atrial fibrillation (HCC)   Acute renal failure Creatinine on admission 1.62 with a baseline 0.9-1.1.  Likely prerenal azotemia secondary to dehydration.  CK level 51, within normal limits. --Cr 1.62>1.10 --Continue IVF with 1/2 NS at 100 mL's per hour for 24 hours --Restart home irbesartan tomorrow morning --Follow BMP daily  Hypernatremia Hypokalemia Sodium 146 with potassium 3.4 on admission; likely secondary to poor oral intake/dehydration. --Potassium repleted, 3.5 this morning, will give additional potassium supplement today --Sodium 139 today,  improved. --Continue IV fluid hydration for 24 hours total --Follow electrolytes daily to include magnesium  Mechanical falls Patient denies loss of consciousness, states left knee bothersome at times with buckling.  Pelvis x-ray with no acute findings.  Left knee x-ray with small suprapatellar joint effusion without fracture or dislocation and notable tricompartmental osteoarthritis. --PT evaluation --Likely need SNF placement, nursing reports his landlord states his trailer is severely disheveled with holes on the floor and is uninhabitable  Elevated troponin with EKG changes Troponin 16>18, 18.  EKG with T wave inversions II, II, aVF and V3 through V6.  Review of previous EKG March this year shows change.  Patient follows with cardiology at [facility].  Review in care everywhere, it does not look like patient has been seen since February 2017. --Currently denies chest pain --Check TTE --Lipid panel --Continue to trend troponin --repeat EKG --Start aspirin 81 mg p.o. daily --Continue atorvastatin 80 mg p.o. daily --Consult cardiology given EKG changes from March 2021 in the setting of mildly elevated troponin of 18; for consideration of stratification with stress test versus other invasive testing.  Chronic lymphocytic leukemia Follows with medical hematology at [facility]. --Resume home venetoclax. --reports on Valtrex for herpes; reports he takes this as needed. --Continue outpatient follow-up  History of paroxysmal atrial fibrillation Patient reports developed this when he was critically ill as a complication of his chemotherapy.  He is followed by cardiology at [facility].  Currently not on anticoagulation likely secondary to falls.  Patient on amiodarone and metoprolol.  Currently in normal sinus rhythm. --Continue amiodarone 200 mg p.o. daily --Continue metoprolol tartrate 100 mg p.o. twice daily --Continue monitor on  telemetry  Essential hypertension BP 122/62 this morning, well controlled. --Continue metoprolol 100 mg p.o. twice daily, amlodipine 10 mg p.o. daily --Resume home irbesartan in the a.m. --Continue statin  Type 2 diabetes mellitus Hemoglobin A1c 13.0, poorly controlled.  On Metformin 1000 mg p.o. twice daily at home. --Start Lantus 20 units subcutaneously daily --Continue insulin sliding scale for coverage --CBGs before every meal/at bedtime --Consistent carbohydrate diet --Diabetic educator consult  HLD: Continue atorvastatin 80 mg p.o. daily  Ethics: Nursing reports that patient's landlord evaluated his living condition which was apparently deplorable with trash throughout his trailer with holes in the floor; and landlord states that his trailer is uninhabitable at this time.  Patient likely needs placement with his recurrent falls and inability to care for himself. --Transition of care for assistance in placement  DVT prophylaxis: Lovenox Code Status: Full code Family Communication: Updated patient extensively at bedside, no family present.  Disposition Plan:  Status is: Observation  The patient remains OBS appropriate and will d/c before 2 midnights.  Dispo: The patient is from: Home              Anticipated d/c is to: SNF              Anticipated d/c date is: 1 day              Patient currently is not medically stable to d/c.   Consultants:   Cardiology  Procedures:   TTE: pending  Antimicrobials:   none   Subjective: Patient seen and examined bedside, resting comfortably.  Just finished walking with physical therapy.  Feels slightly stronger this morning.  Continues with mild left knee pain.  No other specific complaints at this time.  Denies headache, no visual changes, no chest pain, no palpitations, no shortness of breath, no nausea/vomiting/diarrhea, no fever/chills/night sweats, no abdominal pain, no paresthesias.  Objective: Vitals:   09/15/19 0431  09/15/19 0830 09/15/19 1157 09/15/19 1224  BP: 122/62 (!) 105/58 105/64 130/75  Pulse: (!) 50 (!) 59 (!) 54 64  Resp: 20 20 20 20   Temp: 97.9 F (36.6 C) (!) 97.4 F (36.3 C) 98.2 F (36.8 C) 97.7 F (36.5 C)  TempSrc: Oral Oral  Oral  SpO2: 98% 98% 97% 99%  Weight:      Height:        Intake/Output Summary (Last 24 hours) at 09/15/2019 1352 Last data filed at 09/15/2019 1007 Gross per 24 hour  Intake 2036.67 ml  Output --  Net 2036.67 ml   Filed Weights   09/14/19 1246  Weight: 134 kg    Examination:  General exam: Appears calm and comfortable, resting in bedside chair Respiratory system: Clear to auscultation. Respiratory effort normal.  Oxygenating well on room air Cardiovascular system: S1 & S2 heard, RRR. No JVD, murmurs, rubs, gallops or clicks. No pedal edema. Gastrointestinal system: Abdomen is nondistended, soft and nontender. No organomegaly or masses felt. Normal bowel sounds heard. Central nervous system: Alert and oriented. No focal neurological deficits. Extremities: Symmetric 5 x 5 power. Skin: No rashes, lesions or ulcers Psychiatry: Judgement and insight appear poor. Mood & affect appropriate.     Data Reviewed: I have personally reviewed following labs and imaging studies  CBC: Recent Labs  Lab 09/14/19 1404  WBC 7.6  NEUTROABS 3.6  HGB 13.0  HCT 42.1  MCV 102.4*  PLT 735   Basic Metabolic Panel: Recent Labs  Lab 09/14/19 1404 09/15/19 0552  NA 146* 139  K 3.4* 3.5  CL 110 105  CO2 21* 21*  GLUCOSE 354* 239*  BUN 22 17  CREATININE 1.62* 1.10  CALCIUM 8.7* 7.7*  MG 2.1  --   PHOS 2.3*  --    GFR: Estimated Creatinine Clearance: 91.1 mL/min (by C-G formula based on SCr of 1.1 mg/dL). Liver Function Tests: Recent Labs  Lab 09/14/19 1404  AST 46*  ALT 40  ALKPHOS 34*  BILITOT 0.8  PROT 6.8  ALBUMIN 3.8   No results for input(s): LIPASE, AMYLASE in the last 168 hours. No results for input(s): AMMONIA in the last 168  hours. Coagulation Profile: No results for input(s): INR, PROTIME in the last 168 hours. Cardiac Enzymes: Recent Labs  Lab 09/14/19 1404  CKTOTAL 51   BNP (last 3 results) No results for input(s): PROBNP in the last 8760 hours. HbA1C: No results for input(s): HGBA1C in the last 72 hours. CBG: Recent Labs  Lab 09/14/19 2150 09/15/19 0731 09/15/19 1140  GLUCAP 225* 214* 265*   Lipid Profile: No results for input(s): CHOL, HDL, LDLCALC, TRIG, CHOLHDL, LDLDIRECT in the last 72 hours. Thyroid Function Tests: No results for input(s): TSH, T4TOTAL, FREET4, T3FREE, THYROIDAB in the last 72 hours. Anemia Panel: No results for input(s): VITAMINB12, FOLATE, FERRITIN, TIBC, IRON, RETICCTPCT in the last 72 hours. Sepsis Labs: No results for input(s): PROCALCITON, LATICACIDVEN in the last 168 hours.  Recent Results (from the past 240 hour(s))  SARS Coronavirus 2 by RT PCR (hospital order, performed in [facility] hospital lab) Nasopharyngeal Nasopharyngeal Swab     Status: None   Collection Time: 09/14/19  2:34 PM   Specimen: Nasopharyngeal Swab  Result Value Ref Range Status   SARS Coronavirus 2 NEGATIVE NEGATIVE Final    Comment: (NOTE) SARS-CoV-2 target nucleic acids are NOT DETECTED.  The SARS-CoV-2 RNA is generally detectable in upper and lower respiratory specimens during the acute phase of infection. The lowest concentration of SARS-CoV-2 viral copies this assay can detect is 250 copies / mL. A negative result does not preclude SARS-CoV-2 infection and should not be used as the sole basis for treatment or other patient management decisions.  A negative result may occur with improper specimen collection / handling, submission of specimen other than nasopharyngeal swab, presence of viral mutation(s) within the areas targeted by this assay, and inadequate number of viral copies (<250 copies / mL). A negative result must be combined with clinical observations, patient history,  and epidemiological information.  Fact Sheet for Patients:   StrictlyIdeas.no  Fact Sheet for Healthcare Providers: BankingDealers.co.za  This test is not yet approved or  cleared by the [place] FDA and has been authorized for detection and/or diagnosis of SARS-CoV-2 by FDA under an Emergency Use Authorization (EUA).  This EUA will remain in effect (meaning this test can be used) for the duration of the COVID-19 declaration under Section 564(b)(1) of the Act, 21 U.S.C. section 360bbb-3(b)(1), unless the authorization is terminated or revoked sooner.  Performed at [facility], [address], [place], [place] [address]          Radiology Studies: DG Chest 2 View  Result Date: 09/14/2019 CLINICAL DATA:  Status post fall. EXAM: CHEST - 2 VIEW COMPARISON:  June 08, 2012. FINDINGS: Stable cardiomegaly. No pneumothorax or pleural effusion is noted. Both lungs are clear. The visualized  skeletal structures are unremarkable. IMPRESSION: No active cardiopulmonary disease. Electronically Signed   By: Marijo Conception M.D.   On: 09/14/2019 17:10   DG Pelvis 1-2 Views  Result Date: 09/14/2019 CLINICAL DATA:  Status post fall. EXAM: PELVIS - 1-2 VIEW COMPARISON:  None. FINDINGS: There is no evidence of pelvic fracture or diastasis. No pelvic bone lesions are seen. IMPRESSION: Negative. Electronically Signed   By: Marijo Conception M.D.   On: 09/14/2019 17:11        Scheduled Meds: . allopurinol  300 mg Oral Daily  . amiodarone  200 mg Oral Daily  . amLODipine  10 mg Oral Daily  . atorvastatin  80 mg Oral Daily  . Chlorhexidine Gluconate Cloth  6 each Topical Daily  . enoxaparin (LOVENOX) injection  65 mg Subcutaneous Q24H  . insulin aspart  0-9 Units Subcutaneous TID WC  . insulin glargine  20 Units Subcutaneous Daily  . metoprolol tartrate  100 mg Oral BID  . tamsulosin  0.4 mg Oral QPC supper  . venetoclax  200 mg Oral Daily    Continuous Infusions: . sodium chloride 100 mL/hr at 09/15/19 0639     LOS: 0 days    Time spent: 44 minutes spent on chart review, discussion with nursing staff, consultants, updating family and interview/physical exam; more than 50% of that time was spent in counseling and/or coordination of care.    Arrin Pintor J [place] DO [facility] Available via Epic secure chat 7am-7pm After these hours, please refer to coverage provider listed on [email or web address] 09/15/2019, 1:52 PM

## 2019-09-15 NOTE — TOC Progression Note (Signed)
Transition of Care [facility]) - Progression Note   Patient Details  Name: BERWYN BIGLEY MRN: [id number] Date of Birth: April 20, 1950  Transition of Care [facility]) CM/SW [place], LCSW Phone Number: 09/15/2019, 4:19 PM  Clinical Narrative: FL2 completed and cosigned. Patient referred out to SNFs in [place] and [place]. CSW called Bernadene Bell to start insurance authorization and spoke with [place]. Reference # [id number]. Clinicals faxed to [facility].   Expected Discharge Plan: [place] Barriers to Discharge: Continued Medical Work up  Expected Discharge Plan and Services Expected Discharge Plan: [place] In-house Referral: Clinical Social Work Discharge Planning Services: NA Post Acute Care Choice: [place] Living arrangements for the past 2 months: Mobile Home            DME Arranged: N/A DME Agency: NA HH Arranged: NA [place] Agency: NA  Readmission Risk Interventions No flowsheet data found.

## 2019-09-15 NOTE — Progress Notes (Signed)
**Note De-identified Tim Duncan Obfuscation** EKG complete and placed in patient chart 

## 2019-09-15 NOTE — Evaluation (Signed)
Physical Therapy Evaluation Patient Details Name: Tim Duncan MRN: [id number] DOB: 1950/09/21 Today's Date: 09/15/2019   History of Present Illness  Tim Duncan is a 69 y.o. male with medical history significant for chronic lymphocytic leukemia, hypertension, atrial fibrillation, BPH.Patient presented to the ED with reports of 2 falls.  First fall was on Tuesday, 6/8, and then he fell again the next day wednesday, and he remained on the floor  (for 7 days) till today when a neighbor came to check on him.  He lives in a mobile home by himself.He tells me his 2 legs just felt weak and buckled.  He denies prior falls.   He remained conscious the whole time-prior to, during and after the fall.  He did not hit his head.  He is not on anticoagulation.  He denies chest pains, no difficulty breathing.  Reports chronic unchanged back pain only exertion.No fever no chills, no cough, no vomiting no loose stools, no pain with urination.    Clinical Impression  Patient limited for functional mobility as stated below secondary to BLE weakness, fatigue and poor standing balance.  Patient tends to push RW to far in front during ambulation resulting in Mod/severe fall risk, has difficulty making turns and limited for gait training due to c/o fatigue.  Patient tolerated sitting up in chair after therapy - nursing staff aware.  Patient will benefit from continued physical therapy in hospital and recommended venue below to increase strength, balance, endurance for safe ADLs and gait.     Follow Up Recommendations SNF    Equipment Recommendations  None recommended by PT    Recommendations for Other Services       Precautions / Restrictions Precautions Precautions: Fall Restrictions Weight Bearing Restrictions: No      Mobility  Bed Mobility Overal bed mobility: Needs Assistance Bed Mobility: Supine to Sit     Supine to sit: Supervision;HOB elevated     General bed mobility comments: had to use  bedrail, increased time, labored movement  Transfers Overall transfer level: Needs assistance Equipment used: Rolling walker (2 wheeled);None Transfers: Sit to/from [employer] to Stand: Min assist Stand pivot transfers: Min assist       General transfer comment: Patient unable to complete sit to stands without AD due to BLE weakness, required use of RW  Ambulation/Gait Ambulation/Gait assistance: Min assist Gait Distance (Feet): 50 Feet Assistive device: Rolling walker (2 wheeled) Gait Pattern/deviations: Decreased step length - right;Decreased step length - left;Decreased stride length;Trunk flexed Gait velocity: decreased   General Gait Details: labored cadence with tendency to push RW to far in front due to flexed trunk and generalized weakness, limited secondary to c/o fatigue  Stairs            Wheelchair Mobility    Modified Rankin (Stroke Patients Only)       Balance Overall balance assessment: Needs assistance Sitting-balance support: Feet supported;No upper extremity supported Sitting balance-Leahy Scale: Fair Sitting balance - Comments: fair/good seated at EOB   Standing balance support: During functional activity;No upper extremity supported Standing balance-Leahy Scale: Poor Standing balance comment: fair using RW                             Pertinent Vitals/Pain Pain Assessment: No/denies pain    Home Living Family/patient expects to be discharged to:: Private residence Living Arrangements: Alone Available Help at Discharge: Neighbor;Available PRN/intermittently Type of Home: Mobile home Home  Access: Stairs to enter Entrance Stairs-Rails: Right;Left;Can reach both Entrance Stairs-Number of Steps: 2 Home Layout: One level Home Equipment: Clinical cytogeneticist - 2 wheels      Prior Function Level of Independence: Independent         Comments: household and short distanced community ambulator, drives      Journalist, newspaper        Extremity/Trunk Assessment   Upper Extremity Assessment Upper Extremity Assessment: Generalized weakness    Lower Extremity Assessment Lower Extremity Assessment: Generalized weakness    Cervical / Trunk Assessment Cervical / Trunk Assessment: Kyphotic  Communication   Communication: No difficulties  Cognition Arousal/Alertness: Awake/alert Behavior During Therapy: WFL for tasks assessed/performed Overall Cognitive Status: Within Functional Limits for tasks assessed                                        General Comments      Exercises     Assessment/Plan    PT Assessment Patient needs continued PT services  PT Problem List Decreased strength;Decreased activity tolerance;Decreased balance;Decreased mobility       PT Treatment Interventions Balance training;Gait training;Stair training;Functional mobility training;Therapeutic activities;Therapeutic exercise;Patient/family education    PT Goals (Current goals can be found in the Care Plan section)  Acute Rehab PT Goals Patient Stated Goal: return home after rehab PT Goal Formulation: With patient Time For Goal Achievement: 09/29/19 Potential to Achieve Goals: Good    Frequency Min 3X/week   Barriers to discharge        Co-evaluation               AM-PAC PT "6 Clicks" Mobility  Outcome Measure Help needed turning from your back to your side while in a flat bed without using bedrails?: None Help needed moving from lying on your back to sitting on the side of a flat bed without using bedrails?: A Little Help needed moving to and from a bed to a chair (including a wheelchair)?: A Little Help needed standing up from a chair using your arms (e.g., wheelchair or bedside chair)?: A Little Help needed to walk in hospital room?: A Lot Help needed climbing 3-5 steps with a railing? : A Lot 6 Click Score: 17    End of Session   Activity Tolerance: Patient tolerated  treatment well;Patient limited by fatigue Patient left: in chair;with call bell/phone within reach Nurse Communication: Mobility status PT Visit Diagnosis: Unsteadiness on feet (R26.81);Other abnormalities of gait and mobility (R26.89);Muscle weakness (generalized) (M62.81)    Time: 3794-3276 PT Time Calculation (min) (ACUTE ONLY): 33 min   Charges:   PT Evaluation $PT Eval Moderate Complexity: 1 Mod PT Treatments $Therapeutic Activity: 23-37 mins        2:43 PM, 09/15/19 Lonell Grandchild, MPT Physical Therapist with [facility] [phone] office [phone] mobile phone

## 2019-09-15 NOTE — NC FL2 (Signed)
[place] LEVEL OF CARE SCREENING TOOL     IDENTIFICATION  Patient Name: Tim Duncan Birthdate: May 15, 1950 Sex: male Admission Date (Current Location): 09/14/2019  [facility] and [place] Number:  [employer] and Address:  [place] [address], [place]      Provider Number: [id number]  Attending Physician Name and Address:  [place] Eric J, DO  Relative Name and Phone Number:  Angie Fava (friend) [facility]: [phone]    Current Level of Care: Hospital Recommended Level of Care: [place] Prior Approval Number:    Date Approved/Denied:   PASRR Number: [id number]  Discharge Plan: SNF    Current Diagnoses: Patient Active Problem List   Diagnosis Date Noted  . AKI (acute kidney injury) ([place]) 09/14/2019  . Fall 09/14/2019  . Atrial fibrillation ([place]) 09/14/2019  . Varicose veins of lower extremities with complications 16/94/5038  . Chronic venous insufficiency 06/23/2014  . Multiple contusions 06/23/2014  . Synovial cyst 12/15/2013  . CLL (chronic lymphocytic leukemia) ([place])   . Lymphadenopathy   . Lymphocytosis   . Thrombocytosis ([place])   . Anemia 08/26/2012    Orientation RESPIRATION BLADDER Height & Weight     Self, Time, Situation, Place  Normal Continent Weight: 295 lb 6.7 oz (134 kg) Height:  6' (182.9 cm)  BEHAVIORAL SYMPTOMS/MOOD NEUROLOGICAL BOWEL NUTRITION STATUS      Continent Diet (Heart healthy)  AMBULATORY STATUS COMMUNICATION OF NEEDS Skin   Limited Assist Verbally Skin abrasions, Other (Comment) (Abrasion/Ecchymosis: left chest; MASD: right abdomen, left buttocks)                       Personal Care Assistance Level of Assistance  Bathing, Feeding, Dressing Bathing Assistance: Limited assistance Feeding assistance: Independent Dressing Assistance: Limited assistance     Functional Limitations Info  Sight, Hearing, Speech Sight Info: Adequate Hearing Info:  Adequate Speech Info: Adequate    SPECIAL CARE FACTORS FREQUENCY  PT (By licensed PT)     PT Frequency: Min 3x's/week              Contractures Contractures Info: Not present    Additional Factors Info  Allergies, Code Status Code Status Info: Full Allergies Info: NKA           Current Medications (09/15/2019):  This is the current hospital active medication list Current Facility-Administered Medications  Medication Dose Route Frequency Provider Last Rate Last Admin  . 0.45 % sodium chloride infusion   Intravenous Continuous Emokpae, Ejiroghene E, MD 100 mL/hr at 09/15/19 0639 New Bag at 09/15/19 8828  . acetaminophen (TYLENOL) tablet 650 mg  650 mg Oral Q6H PRN Emokpae, Ejiroghene E, MD       Or  . acetaminophen (TYLENOL) suppository 650 mg  650 mg Rectal Q6H PRN Emokpae, Ejiroghene E, MD      . allopurinol (ZYLOPRIM) tablet 300 mg  300 mg Oral Daily Emokpae, Ejiroghene E, MD   300 mg at 09/15/19 1013  . amiodarone (PACERONE) tablet 200 mg  200 mg Oral Daily Emokpae, Ejiroghene E, MD   200 mg at 09/15/19 1013  . amLODipine (NORVASC) tablet 10 mg  10 mg Oral Daily Emokpae, Ejiroghene E, MD   10 mg at 09/15/19 1013  . aspirin EC tablet 81 mg  81 mg Oral Daily [place] Eric J, DO      . atorvastatin (LIPITOR) tablet 80 mg  80 mg Oral Daily Emokpae, Leanne Chang, MD  80 mg at 09/15/19 1014  . Chlorhexidine Gluconate Cloth 2 % PADS 6 each  6 each Topical Daily [place] Donnamarie Poag, DO      . enoxaparin (LOVENOX) injection 65 mg  65 mg Subcutaneous Q24H [place] Eric J, DO      . insulin aspart (novoLOG) injection 0-9 Units  0-9 Units Subcutaneous TID WC Emokpae, Ejiroghene E, MD   5 Units at 09/15/19 1200  . insulin glargine (LANTUS) injection 20 Units  20 Units Subcutaneous Daily [place] Eric J, DO      . insulin starter kit- pen needles (English) 1 kit  1 kit Other Once [place] Donnamarie Poag, DO      . living well with diabetes book MISC   Does not apply Once [place] Donnamarie Poag, DO       . metoprolol tartrate (LOPRESSOR) tablet 100 mg  100 mg Oral BID Emokpae, Ejiroghene E, MD   100 mg at 09/15/19 1014  . polyethylene glycol (MIRALAX / GLYCOLAX) packet 17 g  17 g Oral Daily PRN Emokpae, Ejiroghene E, MD      . promethazine (PHENERGAN) tablet 12.5 mg  12.5 mg Oral Q6H PRN Emokpae, Ejiroghene E, MD      . tamsulosin (FLOMAX) capsule 0.4 mg  0.4 mg Oral QPC supper Emokpae, Ejiroghene E, MD      . venetoclax TABS 200 mg  200 mg Oral Daily Emokpae, Ejiroghene E, MD         Discharge Medications: Please see discharge summary for a list of discharge medications.  Relevant Imaging Results:  Relevant Lab Results:   Additional Information SSN#: [id number]  Sherie Don, LCSW

## 2019-09-15 NOTE — Progress Notes (Addendum)
Inpatient Diabetes Program Recommendations  AACE/ADA: New Consensus Statement on Inpatient Glycemic Control (2015)  Target Ranges:  Prepandial:   less than 140 mg/dL      Peak postprandial:   less than 180 mg/dL (1-2 hours)      Critically ill patients:  140 - 180 mg/dL   Lab Results  Component Value Date   GLUCAP 265 (H) 09/15/2019    Review of Glycemic Control Results for Tim Duncan, Tim Duncan (MRN [id number]) as of 09/15/2019 14:06  Ref. Range 09/14/2019 21:50 09/15/2019 07:31 09/15/2019 11:40  Glucose-Capillary Latest Ref Range: 70 - 99 mg/dL 225 (H) 214 (H) 265 (H)   Diabetes history: DM2 Outpatient Diabetes medications: Metformin 1 gm bid Current orders for Inpatient glycemic control: Lantus 20 units qd + Novolog sensitive correction tid  Inpatient Diabetes Program Recommendations:   Received consult. Ordered Living Well With Diabetes book & starter kit of insulin pen needles. Will follow.  Thank you, Nani Gasser. Janielle Mittelstadt, RN, MSN, CDE  Diabetes Coordinator Inpatient Glycemic Control Team Team Pager [phone] (8am-5pm) 09/15/2019 2:14 PM

## 2019-09-16 ENCOUNTER — Inpatient Hospital Stay (HOSPITAL_COMMUNITY): Payer: Medicare Other

## 2019-09-16 DIAGNOSIS — C9110 Chronic lymphocytic leukemia of B-cell type not having achieved remission: Secondary | ICD-10-CM

## 2019-09-16 DIAGNOSIS — R9431 Abnormal electrocardiogram [ECG] [EKG]: Secondary | ICD-10-CM

## 2019-09-16 DIAGNOSIS — W19XXXA Unspecified fall, initial encounter: Secondary | ICD-10-CM

## 2019-09-16 DIAGNOSIS — I4891 Unspecified atrial fibrillation: Secondary | ICD-10-CM

## 2019-09-16 LAB — BASIC METABOLIC PANEL
Anion gap: 13 (ref 5–15)
BUN: 15 mg/dL (ref 8–23)
CO2: 22 mmol/L (ref 22–32)
Calcium: 7.9 mg/dL — ABNORMAL LOW (ref 8.9–10.3)
Chloride: 102 mmol/L (ref 98–111)
Creatinine, Ser: 1.08 mg/dL (ref 0.61–1.24)
GFR calc Af Amer: >60 mL/min (ref >60)
GFR calc non Af Amer: >60 mL/min (ref >60)
Glucose, Bld: 280 mg/dL — ABNORMAL HIGH (ref 70–99)
Potassium: 3.3 mmol/L — ABNORMAL LOW (ref 3.5–5.1)
Sodium: 137 mmol/L (ref 135–145)

## 2019-09-16 LAB — ECHOCARDIOGRAM COMPLETE
Height: 72 in
Weight: 4726.66 oz

## 2019-09-16 LAB — GLUCOSE, CAPILLARY
Glucose-Capillary: 318 mg/dL — ABNORMAL HIGH (ref 70–99)
Glucose-Capillary: 286 mg/dL — ABNORMAL HIGH (ref 70–99)
Glucose-Capillary: 153 mg/dL — ABNORMAL HIGH (ref 70–99)
Glucose-Capillary: 187 mg/dL — ABNORMAL HIGH (ref 70–99)

## 2019-09-16 LAB — HEMOGLOBIN A1C
Hgb A1c MFr Bld: 9 % — ABNORMAL HIGH (ref 4.8–5.6)
Mean Plasma Glucose: 212 mg/dL

## 2019-09-16 LAB — MAGNESIUM: Magnesium: 1.8 mg/dL (ref 1.7–2.4)

## 2019-09-16 LAB — LDL CHOLESTEROL, DIRECT: Direct LDL: 174 mg/dL — ABNORMAL HIGH (ref 0–99)

## 2019-09-16 MED ORDER — INSULIN ASPART 100 UNIT/ML ~~LOC~~ SOLN
5.0000 [IU] | Freq: Three times a day (TID) | SUBCUTANEOUS | Status: DC
  Administered 2019-09-16 – 2019-09-17 (×4): 5 [IU] via SUBCUTANEOUS

## 2019-09-16 MED ORDER — POTASSIUM CHLORIDE CRYS ER 20 MEQ PO TBCR
40.0000 meq | EXTENDED_RELEASE_TABLET | ORAL | Status: AC
  Administered 2019-09-16 (×2): 40 meq via ORAL
  Filled 2019-09-16 (×2): qty 2

## 2019-09-16 MED ORDER — INSULIN GLARGINE 100 UNIT/ML ~~LOC~~ SOLN
10.0000 [IU] | Freq: Once | SUBCUTANEOUS | Status: AC
  Administered 2019-09-16: 10 [IU] via SUBCUTANEOUS
  Filled 2019-09-16: qty 0.1

## 2019-09-16 MED ORDER — INSULIN GLARGINE 100 UNIT/ML ~~LOC~~ SOLN
30.0000 [IU] | Freq: Every day | SUBCUTANEOUS | Status: DC
  Administered 2019-09-17: 30 [IU] via SUBCUTANEOUS
  Filled 2019-09-16 (×5): qty 0.3

## 2019-09-16 NOTE — Progress Notes (Signed)
Inpatient Diabetes Program Recommendations  AACE/ADA: New Consensus Statement on Inpatient Glycemic Control (2015)  Target Ranges:  Prepandial:   less than 140 mg/dL      Peak postprandial:   less than 180 mg/dL (1-2 hours)      Critically ill patients:  140 - 180 mg/dL   Lab Results  Component Value Date   GLUCAP 318 (H) 09/16/2019   HGBA1C 9.0 (H) 09/14/2019    Review of Glycemic Control  Diabetes history: DM2 Outpatient Diabetes medications: Metformin 1 gm bid Current orders for Inpatient glycemic control: Lantus 20 units qd + Novolog sensitive correction tid  Inpatient Diabetes Program Recommendations:   Noted CBGs continue to be elevated. -Increase Lantus to 30 units -Add Novolog 5 units tid meal coverage if eats 50%  Thank you, Bethena Roys E. Marya Lowden, RN, MSN, CDE  Diabetes Coordinator Inpatient Glycemic Control Team Team Pager [phone] (8am-5pm) 09/16/2019 9:42 AM

## 2019-09-16 NOTE — Progress Notes (Signed)
PROGRESS NOTE    Tim Duncan  VZC:[id number] DOB: [birth year] DOA: 09/14/2019 PCP: Celene Squibb, MD    Brief Narrative:  Patient admitted to the hospital with a working diagnosis of acute kidney injury.  68 year old male with past medical history for chronic lymphocytic leukemia, hypertension, atrial fibrillation, and BPH.  Patient experiencing frequent falls, last event 6/9, apparently remained on the floor for 7 days until neighbor found him down.  He lives in a mobile home by himself.  He was too weak to stand up again.  On his initial physical examination blood pressure 146/85, heart rate 78, respiratory rate 25, oxygen saturation 94%.  He had moist mucous membranes, his lungs were clear to auscultation bilaterally, heart S1-S2, present and rhythmic, soft abdomen, no lower extremity edema, he had decreased strength 4/5 for 4 extremities. Sodium 146, potassium 3.4, chloride 110, bicarb 21, glucose 354, BUN 22, creatinine 1.62, anion gap 15, magnesium 2.1, AST 46, ALT 40, CK 51, troponin I 16, white count 7.6, hemoglobin 13.0, hematocrit 42.1, platelets 192.  SARS COVID-19 negative.  Chest radiograph negative for infiltrates, pelvic x-rays negative for fractures.  EKG 96 bpm, normal axis, normal intervals, sinus rhythm, no ST segment changes, negative T waves V4 through V6.  Patient has been placed on intravenous fluids with improvement of kidney function along with electrolytes.  Patient was evaluated by physical therapy, recommendations to transfer to skilled nursing facility.  Further work-up with echocardiography, pending report.   Assessment & Plan:   Principal Problem:   AKI (acute kidney injury) ([place]) Active Problems:   CLL (chronic lymphocytic leukemia) ([place])   Fall   Atrial fibrillation ([place])   1. Hypovolemic AKI, complicated with hyponatremia, and hypokalemia. Renal function with serum cr down to 1,08, K at 3,3 and Na at 137 with serum bicarbonate at 22. Patient with improved  po intake.  Will hold on IV fluids and will continue K correction with Kcl 40 meq x 2, will follow up with renal function and electrolytes in am.   2. Paroxysmal atrial fibrillation. Rate controlled, continue with metoprolol and amiodarone. Not on anticoagulation. Continue with low dose aspirin.   3. Ambulatory dysfunction/ bilateral knee osteoarthritis. No acute flare, but positive ambulatory dysfunction with frequent falls. Continue PT and OT, follow with SNF.   4. Chronic lymphocytic leukemia. Cell count has been stable. Continue with venetoclax.   5. HTN. Continue blood pressure control with irbesartan and amlodipine. Pending echocardiogram.   6. Uncontrolled T2DM (Hgb A1c 13.0)/ dyslipidemia. Glucose 280 this am fasting, capillary 261 and 318.  Will increase basal insulin to 30 units and add 5 units aspart with meals. Continue glucose monitoring and coverage with insulin sliding scale.  Continue with atorvastatin.    7. BPH. Continue with tamsulosin.   8. Class 3 obesity. Calculated BMI is 40,06. Will need close follow up as outpatient.   Status is: Inpatient  Remains inpatient appropriate because:Inpatient level of care appropriate due to severity of illness   Dispo:  Patient From: Home  Planned Disposition: [place]  Expected discharge date: 09/17/19  Medically stable for discharge: No   DVT prophylaxis: Enoxaparin   Code Status:   full  Family Communication:  No family at the bedside       Subjective: Patient feeling better, but continue to be very weak and deconditioned, no nausea or vomiting, no dyspnea or chest pain.   Objective: Vitals:   09/15/19 1224 09/15/19 2013 09/15/19 2029 09/16/19 0445  BP:  130/75  108/64 (!) 110/59  Pulse: 64  (!) 48 (!) 57  Resp: 20  20 17   Temp: 97.7 F (36.5 C)  97.7 F (36.5 C) (!) 96.3 F (35.7 C)  TempSrc: Oral  Oral Axillary  SpO2: 99% 99% 96% 97%  Weight:      Height:        Intake/Output Summary  (Last 24 hours) at 09/16/2019 0950 Last data filed at 09/16/2019 0900 Gross per 24 hour  Intake 480 ml  Output 2000 ml  Net -1520 ml   Filed Weights   09/14/19 1246  Weight: 134 kg    Examination:   General: Not in pain or dyspnea, deconditioned  Neurology: Awake and alert, non focal  E ENT: mild pallor, no icterus, oral mucosa moist Cardiovascular: No JVD. S1-S2 present, rhythmic, no gallops, rubs, or murmurs. No lower extremity edema. Pulmonary: positive breath sounds bilaterally with no wheezing, rhonchi or rales. Gastrointestinal. Abdomen with no organomegaly, non tender, no rebound or guarding Skin. No rashes Musculoskeletal: no joint deformities     Data Reviewed: I have personally reviewed following labs and imaging studies  CBC: Recent Labs  Lab 09/14/19 1404  WBC 7.6  NEUTROABS 3.6  HGB 13.0  HCT 42.1  MCV 102.4*  PLT 361   Basic Metabolic Panel: Recent Labs  Lab 09/14/19 1404 09/15/19 0552 09/16/19 0657  NA 146* 139 137  K 3.4* 3.5 3.3*  CL 110 105 102  CO2 21* 21* 22  GLUCOSE 354* 239* 280*  BUN 22 17 15   CREATININE 1.62* 1.10 1.08  CALCIUM 8.7* 7.7* 7.9*  MG 2.1  --  1.8  PHOS 2.3*  --   --    GFR: Estimated Creatinine Clearance: 92.8 mL/min (by C-G formula based on SCr of 1.08 mg/dL). Liver Function Tests: Recent Labs  Lab 09/14/19 1404  AST 46*  ALT 40  ALKPHOS 34*  BILITOT 0.8  PROT 6.8  ALBUMIN 3.8   No results for input(s): LIPASE, AMYLASE in the last 168 hours. No results for input(s): AMMONIA in the last 168 hours. Coagulation Profile: No results for input(s): INR, PROTIME in the last 168 hours. Cardiac Enzymes: Recent Labs  Lab 09/14/19 1404  CKTOTAL 51   BNP (last 3 results) No results for input(s): PROBNP in the last 8760 hours. HbA1C: Recent Labs    09/14/19 1405  HGBA1C 9.0*   CBG: Recent Labs  Lab 09/15/19 0731 09/15/19 1140 09/15/19 1624 09/15/19 2030 09/16/19 0744  GLUCAP 214* 265* 229* 261* 318*     Lipid Profile: Recent Labs    09/15/19 1426  CHOL 313*  HDL 28*  LDLCALC UNABLE TO CALCULATE IF TRIGLYCERIDE OVER 400 mg/dL  TRIG 640*  CHOLHDL 11.2  LDLDIRECT 174.0*   Thyroid Function Tests: No results for input(s): TSH, T4TOTAL, FREET4, T3FREE, THYROIDAB in the last 72 hours. Anemia Panel: No results for input(s): VITAMINB12, FOLATE, FERRITIN, TIBC, IRON, RETICCTPCT in the last 72 hours.    Radiology Studies: I have reviewed all of the imaging during this hospital visit personally     Scheduled Meds: . allopurinol  300 mg Oral Daily  . amiodarone  200 mg Oral Daily  . amLODipine  10 mg Oral Daily  . aspirin EC  81 mg Oral Daily  . atorvastatin  80 mg Oral Daily  . Chlorhexidine Gluconate Cloth  6 each Topical Daily  . enoxaparin (LOVENOX) injection  65 mg Subcutaneous Q24H  . insulin aspart  0-9 Units Subcutaneous TID WC  .  insulin glargine  20 Units Subcutaneous Daily  . insulin starter kit- pen needles  1 kit Other Once  . irbesartan  300 mg Oral Daily  . metoprolol tartrate  100 mg Oral BID  . tamsulosin  0.4 mg Oral QPC supper  . venetoclax  200 mg Oral Daily   Continuous Infusions:   LOS: 1 day        Icey Tello Gerome Apley, MD

## 2019-09-16 NOTE — Progress Notes (Signed)
IV access lost.  Patient has no IV fluids or IV medications ordered.  On-call MD notified.  Okay to leave access out until day MD can reassess need.  Patient has a PORT that can be accessed if the need is established.

## 2019-09-16 NOTE — Progress Notes (Signed)
*  PRELIMINARY RESULTS* Echocardiogram 2D Echocardiogram has been performed.  Leavy Cella 09/16/2019, 9:18 AM

## 2019-09-16 NOTE — Progress Notes (Signed)
Awaiting INS AUTH for [place].  Sent email to [place] for financial consult. Per Hilda Blades - friend  Feels he will qualify for medicaid.

## 2019-09-17 DIAGNOSIS — E876 Hypokalemia: Secondary | ICD-10-CM | POA: Diagnosis not present

## 2019-09-17 DIAGNOSIS — Z7401 Bed confinement status: Secondary | ICD-10-CM | POA: Diagnosis not present

## 2019-09-17 DIAGNOSIS — Z743 Need for continuous supervision: Secondary | ICD-10-CM | POA: Diagnosis not present

## 2019-09-17 DIAGNOSIS — R69 Illness, unspecified: Secondary | ICD-10-CM | POA: Diagnosis not present

## 2019-09-17 DIAGNOSIS — E782 Mixed hyperlipidemia: Secondary | ICD-10-CM | POA: Diagnosis not present

## 2019-09-17 DIAGNOSIS — W19XXXD Unspecified fall, subsequent encounter: Secondary | ICD-10-CM | POA: Diagnosis not present

## 2019-09-17 DIAGNOSIS — G250 Essential tremor: Secondary | ICD-10-CM | POA: Diagnosis not present

## 2019-09-17 DIAGNOSIS — E119 Type 2 diabetes mellitus without complications: Secondary | ICD-10-CM | POA: Diagnosis not present

## 2019-09-17 DIAGNOSIS — M170 Bilateral primary osteoarthritis of knee: Secondary | ICD-10-CM | POA: Diagnosis not present

## 2019-09-17 DIAGNOSIS — I10 Essential (primary) hypertension: Secondary | ICD-10-CM | POA: Diagnosis not present

## 2019-09-17 DIAGNOSIS — D649 Anemia, unspecified: Secondary | ICD-10-CM | POA: Diagnosis not present

## 2019-09-17 DIAGNOSIS — E1169 Type 2 diabetes mellitus with other specified complication: Secondary | ICD-10-CM | POA: Diagnosis not present

## 2019-09-17 DIAGNOSIS — N179 Acute kidney failure, unspecified: Secondary | ICD-10-CM | POA: Diagnosis not present

## 2019-09-17 DIAGNOSIS — W19XXXA Unspecified fall, initial encounter: Secondary | ICD-10-CM | POA: Diagnosis not present

## 2019-09-17 DIAGNOSIS — R262 Difficulty in walking, not elsewhere classified: Secondary | ICD-10-CM | POA: Diagnosis not present

## 2019-09-17 DIAGNOSIS — E118 Type 2 diabetes mellitus with unspecified complications: Secondary | ICD-10-CM | POA: Diagnosis not present

## 2019-09-17 DIAGNOSIS — I480 Paroxysmal atrial fibrillation: Secondary | ICD-10-CM

## 2019-09-17 DIAGNOSIS — R7309 Other abnormal glucose: Secondary | ICD-10-CM | POA: Diagnosis not present

## 2019-09-17 DIAGNOSIS — Z856 Personal history of leukemia: Secondary | ICD-10-CM | POA: Diagnosis not present

## 2019-09-17 DIAGNOSIS — E870 Hyperosmolality and hypernatremia: Secondary | ICD-10-CM | POA: Diagnosis not present

## 2019-09-17 DIAGNOSIS — C9110 Chronic lymphocytic leukemia of B-cell type not having achieved remission: Secondary | ICD-10-CM | POA: Diagnosis not present

## 2019-09-17 DIAGNOSIS — E785 Hyperlipidemia, unspecified: Secondary | ICD-10-CM | POA: Diagnosis not present

## 2019-09-17 DIAGNOSIS — M6281 Muscle weakness (generalized): Secondary | ICD-10-CM | POA: Diagnosis not present

## 2019-09-17 LAB — BASIC METABOLIC PANEL
Anion gap: 11 (ref 5–15)
BUN: 16 mg/dL (ref 8–23)
CO2: 23 mmol/L (ref 22–32)
Calcium: 8 mg/dL — ABNORMAL LOW (ref 8.9–10.3)
Chloride: 103 mmol/L (ref 98–111)
Creatinine, Ser: 1.14 mg/dL (ref 0.61–1.24)
GFR calc Af Amer: >60 mL/min (ref >60)
GFR calc non Af Amer: >60 mL/min (ref >60)
Glucose, Bld: 248 mg/dL — ABNORMAL HIGH (ref 70–99)
Potassium: 4 mmol/L (ref 3.5–5.1)
Sodium: 137 mmol/L (ref 135–145)

## 2019-09-17 LAB — GLUCOSE, CAPILLARY
Glucose-Capillary: 213 mg/dL — ABNORMAL HIGH (ref 70–99)
Glucose-Capillary: 244 mg/dL — ABNORMAL HIGH (ref 70–99)

## 2019-09-17 MED ORDER — POTASSIUM CHLORIDE CRYS ER 20 MEQ PO TBCR: 40.0000 meq | EXTENDED_RELEASE_TABLET | Freq: Once | ORAL | Status: DC

## 2019-09-17 MED ORDER — INSULIN ASPART 100 UNIT/ML ~~LOC~~ SOLN: 0.0000 [IU] | Freq: Three times a day (TID) | SUBCUTANEOUS | 11 refills | Status: AC

## 2019-09-17 MED ORDER — ASPIRIN 81 MG PO TBEC: 81.0000 mg | DELAYED_RELEASE_TABLET | Freq: Every day | ORAL | 11 refills | Status: AC

## 2019-09-17 MED ORDER — INSULIN GLARGINE 100 UNIT/ML ~~LOC~~ SOLN: 30.0000 [IU] | Freq: Every day | SUBCUTANEOUS | 11 refills | Status: AC

## 2019-09-17 MED ORDER — POLYETHYLENE GLYCOL 3350 17 G PO PACK: 17.0000 g | PACK | Freq: Every day | ORAL | 0 refills | Status: AC | PRN

## 2019-09-17 MED ORDER — INSULIN ASPART 100 UNIT/ML ~~LOC~~ SOLN: 5.0000 [IU] | Freq: Three times a day (TID) | SUBCUTANEOUS | 11 refills | Status: DC

## 2019-09-17 NOTE — TOC Transition Note (Signed)
Transition of Care [facility]) - CM/SW Discharge Note   Patient Details  Name: Tim Duncan MRN: [id number] Date of Birth: September 16, 1950  [facility]) CM/SW Contact:  Boneta Lucks, RN Phone Number: 09/17/2019, 10:21 AM   Clinical Narrative:   Insurance Auth received on patient. [name] at [facility] updated. Patient has not had Covid vaccines. Nurse will sent cancer medication with patient to continue at [facility]. RN to call report. TOC printed medical necessity. EMS will be schedule when ready. Clinicals sent in the hub.     Final next level of care: Skilled Nursing Facility Barriers to Discharge: Barriers Resolved   Patient Goals and CMS Choice Patient states their goals for this hospitalization and ongoing recovery are:: Discharge to SNF for rehab CMS Medicare.gov Compare Post Acute Care list provided to:: Patient Choice offered to / list presented to : Patient  Discharge Placement              Patient chooses bed at: Other - please specify in the comment section below: ([place]) Patient to be transferred to facility by: EMS Name of family member notified: Hilda Blades -friend and Patient Patient and family notified of of transfer: 09/17/19  Discharge Plan and Services In-house Referral: Clinical Social Work Discharge Planning Services: NA Post Acute Care Choice: [place]          DME Arranged: N/A DME Agency: NA       HH Arranged: NA [place] Agency: NA

## 2019-09-17 NOTE — Care Management Important Message (Signed)
Important Message  Patient Details  Name: Tim Duncan MRN: [id number] Date of Birth: February 23, 1951   Medicare Important Message Given:  Yes     [name] 09/17/2019, 1:38 PM

## 2019-09-17 NOTE — Progress Notes (Addendum)
Report called to RN at [facility]. Patient's medication stored in pharmacy retrieved and placed with discharge packet. Patient's personal belongings stored with Security returned to patient

## 2019-09-17 NOTE — Discharge Summary (Signed)
Physician Discharge Summary   Patient ID: LADD CEN MRN: [id number] DOB/AGE: Aug 14, 1950 69 y.o.  Admit date: 09/14/2019 Discharge date: 09/17/2019  Primary Care Physician:  Celene Squibb, MD   Recommendations for Outpatient Follow-up:  1. Follow up with PCP in 1-2 weeks 2. Patient started on Lantus, NovoLog insulin with meals, follow hemoglobin A1c.  Metformin discontinued 3. Fall precautions  Home Health: Patient going to skilled nursing facility Equipment/Devices:   Discharge Condition: stable  CODE STATUS: FULL  Diet recommendation: Carb modified diet   Discharge Diagnoses:    . AKI (acute kidney injury) ([place]) Hypernatremia Hypokalemia Uncontrolled diabetes mellitus type 2, with dyslipidemia, hyperglycemia Paroxysmal atrial fibrillation Ambulatory dysfunction, bilateral knee osteoarthritis Essential hypertension . CLL (chronic lymphocytic leukemia) ([facility]) BPH Morbid obesity  Consults: Diabetic coordinator    Allergies:  No Known Allergies   DISCHARGE MEDICATIONS: Allergies as of 09/17/2019   No Known Allergies     Medication List    STOP taking these medications   fenofibrate 160 MG tablet   meloxicam 7.5 MG tablet Commonly known as: Mobic   metFORMIN 1000 MG tablet Commonly known as: GLUCOPHAGE     TAKE these medications   allopurinol 300 MG tablet Commonly known as: ZYLOPRIM Take 300 mg by mouth daily.   amiodarone 200 MG tablet Commonly known as: PACERONE Take 200 mg by mouth daily.   amLODipine 5 MG tablet Commonly known as: NORVASC Take 10 mg by mouth daily.   aspirin 81 MG EC tablet Take 1 tablet (81 mg total) by mouth daily. Swallow whole.   atorvastatin 80 MG tablet Commonly known as: LIPITOR Take 80 mg by mouth daily.   cyanocobalamin 1000 MCG/ML injection Commonly known as: (VITAMIN B-12) INJECT 1 ML INTO THE MUSCLE MONTHLY UNTIL FURTHER NOTICE FROM ELLIS MD   insulin aspart 100 UNIT/ML injection Commonly known as:  novoLOG Inject 5 Units into the skin 3 (three) times daily with meals.   insulin aspart 100 UNIT/ML injection Commonly known as: novoLOG Inject 0-9 Units into the skin 3 (three) times daily with meals. Sliding scale CBG 70 - 120: 0 units CBG 121 - 150: 1 unit,  CBG 151 - 200: 2 units,  CBG 201 - 250: 3 units,  CBG 251 - 300: 5 units,  CBG 301 - 350: 7 units,  CBG 351 - 400: 9 units   CBG > 400: 9 units and notify your MD   insulin glargine 100 UNIT/ML injection Commonly known as: LANTUS Inject 0.3 mLs (30 Units total) into the skin daily.   irbesartan 300 MG tablet Commonly known as: AVAPRO Take 300 mg by mouth daily.   metoprolol tartrate 100 MG tablet Commonly known as: LOPRESSOR Take 100 mg by mouth 2 (two) times daily.   multivitamin tablet Take 1 tablet by mouth daily.   polyethylene glycol 17 g packet Commonly known as: MIRALAX / GLYCOLAX Take 17 g by mouth daily as needed for mild constipation.   tamsulosin 0.4 MG Caps capsule Commonly known as: FLOMAX Take 0.4 mg by mouth daily after supper.   valACYclovir 1000 MG tablet Commonly known as: VALTREX Take 1,000 mg by mouth daily as needed (outbreak).   venetoclax 100 MG Tabs Take 200 mGy by mouth daily.        Brief H and P: For complete details please refer to admission H and P, but in brief *69 year old male with past medical history for chronic lymphocytic leukemia, hypertension, atrial fibrillation, and BPH.  Patient experiencing frequent  falls, last event 6/9, apparently remained on the floor for 7 days until neighbor found him down.  He lives in a mobile home by himself.  He was too weak to stand up again.  On his initial physical examination blood pressure 146/85, heart rate 78, respiratory rate 25, oxygen saturation 94%.  He had moist mucous membranes, his lungs were clear to auscultation bilaterally, heart S1-S2, present and rhythmic, soft abdomen, no lower extremity edema, he had decreased strength 4/5 for 4  extremities. Sodium 146, potassium 3.4, chloride 110, bicarb 21, glucose 354, BUN 22, creatinine 1.62, anion gap 15, magnesium 2.1, AST 46, ALT 40, CK 51, troponin I 16, white count 7.6, hemoglobin 13.0, hematocrit 42.1, platelets 192.  SARS COVID-19 negative.  Chest radiograph negative for infiltrates, pelvic x-rays negative for fractures.  EKG 96 bpm, normal axis, normal intervals, sinus rhythm, no ST segment changes, negative T waves V4 through V6.  Hospital Course:     AKI (acute kidney injury) ([place]) complicated with hypernatremia, hypokalemia -Patient presented with a sodium of 146, potassium 3.4, blood glucose 354, BUN 22, creatinine 1.6 Baseline creatinine 1.1 on 11/07/2017 -Renal function has improved, creatinine 1.1 at baseline on discharge, sodium 137, potassium 4.0 -Meloxicam discontinued  Uncontrolled diabetes mellitus type 2, hemoglobin A1c 13.0 with hyper glycemia -CBGs uncontrolled, 354 at the time of admission -Diabetic coordinator consult was obtained -Patient placed on 30 units daily basal insulin, NovoLog 5 units 3 times daily AC with meals and sliding scale insulin -Continue same regimen at discharge and adjust outpatient  Paroxysmal atrial fibrillation -Rate controlled, continue with metoprolol, amiodarone -Continue low-dose aspirin, not on anticoagulation due to falls.    CLL (chronic lymphocytic leukemia) ([facility]) -Cell counts has been stable, continue venetoclax  Essential hypertension BP currently stable, continue amlodipine, irbesartan -2D echo showed EF of 6065%, no regional wall motion abnormalities  Ambulatory dysfunction/gait instability multifactorial, bilateral knee osteoarthritis -No acute flare but ambulatory dysfunction with frequent falls -PT recommended skilled nursing facility  BPH Continue tamsulosin  Class III obesity BMI 40, recommended diet and weight control  Day of Discharge S: Doing well, no acute issues, hoping to go to rehab today.   Afebrile, tolerating diet  BP (!) 96/48 (BP Location: Left Arm)   Pulse 60   Temp 97.7 F (36.5 C) (Oral)   Resp 16   Ht 6' (1.829 m)   Wt 134 kg   SpO2 97%   BMI 40.07 kg/m   Physical Exam: General: Alert and awake oriented x3 not in any acute distress. HEENT: anicteric sclera, pupils reactive to light and accommodation CVS: S1-S2 clear no murmur rubs or gallops Chest: clear to auscultation bilaterally, no wheezing rales or rhonchi Abdomen: soft nontender, nondistended, normal bowel sounds Extremities: no cyanosis, clubbing or edema noted bilaterally, right lower extremity chronic venous stasis changes Neuro: Cranial nerves II-XII intact, no focal neurological deficits    Get Medicines reviewed and adjusted: Please take all your medications with you for your next visit with your Primary MD  Please request your Primary MD to go over all hospital tests and procedure/radiological results at the follow up. Please ask your Primary MD to get all Hospital records sent to his/her office.  If you experience worsening of your admission symptoms, develop shortness of breath, life threatening emergency, suicidal or homicidal thoughts you must seek medical attention immediately by calling 911 or calling your MD immediately  if symptoms less severe.  You must read complete instructions/literature along with all the possible  adverse reactions/side effects for all the Medicines you take and that have been prescribed to you. Take any new Medicines after you have completely understood and accept all the possible adverse reactions/side effects.   Do not drive when taking pain medications.   Do not take more than prescribed Pain, Sleep and Anxiety Medications  Special Instructions: If you have smoked or chewed Tobacco  in the last 2 yrs please stop smoking, stop any regular Alcohol  and or any Recreational drug use.  Wear Seat belts while driving.  Please note  You were cared for by a  hospitalist during your hospital stay. Once you are discharged, your primary care physician will handle any further medical issues. Please note that NO REFILLS for any discharge medications will be authorized once you are discharged, as it is imperative that you return to your primary care physician (or establish a relationship with a primary care physician if you do not have one) for your aftercare needs so that they can reassess your need for medications and monitor your lab values.   The results of significant diagnostics from this hospitalization (including imaging, microbiology, ancillary and laboratory) are listed below for reference.      Procedures/Studies:  DG Chest 2 View  Result Date: 09/14/2019 CLINICAL DATA:  Status post fall. EXAM: CHEST - 2 VIEW COMPARISON:  June 08, 2012. FINDINGS: Stable cardiomegaly. No pneumothorax or pleural effusion is noted. Both lungs are clear. The visualized skeletal structures are unremarkable. IMPRESSION: No active cardiopulmonary disease. Electronically Signed   By: Marijo Conception M.D.   On: 09/14/2019 17:10   DG Pelvis 1-2 Views  Result Date: 09/14/2019 CLINICAL DATA:  Status post fall. EXAM: PELVIS - 1-2 VIEW COMPARISON:  None. FINDINGS: There is no evidence of pelvic fracture or diastasis. No pelvic bone lesions are seen. IMPRESSION: Negative. Electronically Signed   By: Marijo Conception M.D.   On: 09/14/2019 17:11   DG Knee Complete 4 Views Left  Result Date: 09/09/2019 CLINICAL DATA:  Left knee pain for 2 days since getting out of car with hearing left knee pop. EXAM: LEFT KNEE - COMPLETE 4+ VIEW COMPARISON:  None. FINDINGS: Small suprapatellar left knee joint effusion. No fracture or dislocation. No suspicious focal osseous lesions. Moderate to severe tricompartmental left knee osteoarthritis. No radiopaque foreign bodies. IMPRESSION: Small suprapatellar left knee joint effusion. No fracture or dislocation. Moderate to severe tricompartmental  left knee osteoarthritis. Electronically Signed   By: Ilona Sorrel M.D.   On: 09/09/2019 09:27   ECHOCARDIOGRAM COMPLETE  Result Date: 09/16/2019    ECHOCARDIOGRAM REPORT   Patient Name:   Tim Duncan Date of Exam: 09/16/2019 Medical Rec #:  [id number]      Height:       72.0 in Accession #:    [id number]     Weight:       295.4 lb Date of Birth:  [birth year]       BSA:          2.515 m Patient Age:    57 years       BP:           110/59 mmHg Patient Gender: M              HR:           57 bpm. Exam Location:  [facility] Procedure: 2D Echo Indications:    Dyspnea 786.09 / R06.00  History:        Patient  has no prior history of Echocardiogram examinations.                 Arrythmias:Atrial Fibrillation; Risk Factors:Former Smoker and                 Hypertension. Thrombocytosis, Lymphadenopathy.  Sonographer:    Leavy Cella RDCS (AE) Referring Phys: [id number] ERIC J [place] IMPRESSIONS  1. Left ventricular ejection fraction, by estimation, is 60 to 65%. The left ventricle has normal function. The left ventricle has no regional wall motion abnormalities. Left ventricular diastolic parameters were normal.  2. Not weel seen basal and mid cavity appear moderately dilated on apical views . Right ventricular systolic function was not well visualized. The right ventricular size is normal.  3. Left atrial size was mildly dilated.  4. The mitral valve is normal in structure. No evidence of mitral valve regurgitation. No evidence of mitral stenosis.  5. The aortic valve is tricuspid. Aortic valve regurgitation is not visualized. Mild aortic valve sclerosis is present, with no evidence of aortic valve stenosis.  6. The inferior vena cava is normal in size with greater than 50% respiratory variability, suggesting right atrial pressure of 3 mmHg. FINDINGS  Left Ventricle: Left ventricular ejection fraction, by estimation, is 60 to 65%. The left ventricle has normal function. The left ventricle has no regional wall motion  abnormalities. The left ventricular internal cavity size was normal in size. There is  borderline left ventricular hypertrophy. Left ventricular diastolic parameters were normal. Right Ventricle: Not weel seen basal and mid cavity appear moderately dilated on apical views. The right ventricular size is normal. No increase in right ventricular wall thickness. Right ventricular systolic function was not well visualized. Left Atrium: Left atrial size was mildly dilated. Right Atrium: Right atrial size was normal in size. Pericardium: There is no evidence of pericardial effusion. Mitral Valve: The mitral valve is normal in structure. Normal mobility of the mitral valve leaflets. No evidence of mitral valve regurgitation. No evidence of mitral valve stenosis. Tricuspid Valve: The tricuspid valve is normal in structure. Tricuspid valve regurgitation is trivial. No evidence of tricuspid stenosis. Aortic Valve: The aortic valve is tricuspid. Aortic valve regurgitation is not visualized. Mild aortic valve sclerosis is present, with no evidence of aortic valve stenosis. Pulmonic Valve: The pulmonic valve was normal in structure. Pulmonic valve regurgitation is not visualized. No evidence of pulmonic stenosis. Aorta: The aortic root is normal in size and structure. Venous: The inferior vena cava is normal in size with greater than 50% respiratory variability, suggesting right atrial pressure of 3 mmHg. IAS/Shunts: The interatrial septum was not well visualized.  LEFT VENTRICLE PLAX 2D LVIDd:         5.02 cm  Diastology LVIDs:         3.29 cm  LV e' lateral:   7.40 cm/s LV PW:         1.37 cm  LV E/e' lateral: 8.3 LV IVS:        1.19 cm  LV e' medial:    6.74 cm/s LVOT diam:     2.10 cm  LV E/e' medial:  9.1 LVOT Area:     3.46 cm  RIGHT VENTRICLE RV S prime:     14.80 cm/s TAPSE (M-mode): 1.6 cm LEFT ATRIUM             Index       RIGHT ATRIUM           Index LA diam:  4.20 cm 1.67 cm/m  RA Area:     17.70 cm LA Vol  (A2C):   71.6 ml 28.47 ml/m RA Volume:   51.30 ml  20.40 ml/m LA Vol (A4C):   36.6 ml 14.55 ml/m LA Biplane Vol: 53.7 ml 21.35 ml/m   AORTA Ao Root diam: 3.80 cm MITRAL VALVE MV Area (PHT): 2.16 cm    SHUNTS MV Decel Time: 352 msec    Systemic Diam: 2.10 cm MV E velocity: 61.60 cm/s MV A velocity: 85.40 cm/s MV E/A ratio:  0.72 Jenkins Rouge MD Electronically signed by Jenkins Rouge MD Signature Date/Time: 09/16/2019/11:43:30 AM    Final        LAB RESULTS: Basic Metabolic Panel: Recent Labs  Lab 09/14/19 1404 09/15/19 0552 09/16/19 0657 09/17/19 0725  NA 146*   < > 137 137  K 3.4*   < > 3.3* 4.0  CL 110   < > 102 103  CO2 21*   < > 22 23  GLUCOSE 354*   < > 280* 248*  BUN 22   < > 15 16  CREATININE 1.62*   < > 1.08 1.14  CALCIUM 8.7*   < > 7.9* 8.0*  MG 2.1  --  1.8  --   PHOS 2.3*  --   --   --    < > = values in this interval not displayed.   Liver Function Tests: Recent Labs  Lab 09/14/19 1404  AST 46*  ALT 40  ALKPHOS 34*  BILITOT 0.8  PROT 6.8  ALBUMIN 3.8   No results for input(s): LIPASE, AMYLASE in the last 168 hours. No results for input(s): AMMONIA in the last 168 hours. CBC: Recent Labs  Lab 09/14/19 1404  WBC 7.6  NEUTROABS 3.6  HGB 13.0  HCT 42.1  MCV 102.4*  PLT 192   Cardiac Enzymes: Recent Labs  Lab 09/14/19 1404  CKTOTAL 51   BNP: Invalid input(s): POCBNP CBG: Recent Labs  Lab 09/16/19 2128 09/17/19 0815  GLUCAP 187* 213*       Disposition and Follow-up: Discharge Instructions    Diet Carb Modified   Complete by: As directed    Increase activity slowly   Complete by: As directed        DISPOSITION: Skilled nursing facility   [place] information for follow-up providers    Celene Squibb, MD In 1 week.   Specialty: Internal Medicine Why: For evaluation of legs giving out Contact information: [place] [facility] [address] [phone]            Contact information for  after-discharge care    [facility] .   Service: Skilled Nursing Contact information: [place] [phone]                   Time coordinating discharge:  35 minutes  Signed:   Estill Cotta M.D. [facility] 09/17/2019, 8:52 AM

## 2019-09-20 DIAGNOSIS — I10 Essential (primary) hypertension: Secondary | ICD-10-CM | POA: Diagnosis not present

## 2019-09-20 DIAGNOSIS — W19XXXD Unspecified fall, subsequent encounter: Secondary | ICD-10-CM | POA: Diagnosis not present

## 2019-09-20 DIAGNOSIS — C9110 Chronic lymphocytic leukemia of B-cell type not having achieved remission: Secondary | ICD-10-CM | POA: Diagnosis not present

## 2019-09-20 DIAGNOSIS — R262 Difficulty in walking, not elsewhere classified: Secondary | ICD-10-CM | POA: Diagnosis not present

## 2019-09-20 DIAGNOSIS — E1169 Type 2 diabetes mellitus with other specified complication: Secondary | ICD-10-CM | POA: Diagnosis not present

## 2019-09-28 DIAGNOSIS — R262 Difficulty in walking, not elsewhere classified: Secondary | ICD-10-CM | POA: Diagnosis not present

## 2019-09-28 DIAGNOSIS — I10 Essential (primary) hypertension: Secondary | ICD-10-CM | POA: Diagnosis not present

## 2019-10-01 DIAGNOSIS — E1169 Type 2 diabetes mellitus with other specified complication: Secondary | ICD-10-CM | POA: Diagnosis not present

## 2019-10-01 DIAGNOSIS — C9110 Chronic lymphocytic leukemia of B-cell type not having achieved remission: Secondary | ICD-10-CM | POA: Diagnosis not present

## 2019-10-01 DIAGNOSIS — W19XXXD Unspecified fall, subsequent encounter: Secondary | ICD-10-CM | POA: Diagnosis not present

## 2019-10-01 DIAGNOSIS — I10 Essential (primary) hypertension: Secondary | ICD-10-CM | POA: Diagnosis not present

## 2019-10-02 DIAGNOSIS — E782 Mixed hyperlipidemia: Secondary | ICD-10-CM | POA: Diagnosis not present

## 2019-10-02 DIAGNOSIS — E785 Hyperlipidemia, unspecified: Secondary | ICD-10-CM | POA: Diagnosis not present

## 2019-10-05 DIAGNOSIS — Z9181 History of falling: Secondary | ICD-10-CM | POA: Diagnosis not present

## 2019-10-05 DIAGNOSIS — E870 Hyperosmolality and hypernatremia: Secondary | ICD-10-CM | POA: Diagnosis not present

## 2019-10-05 DIAGNOSIS — Z87891 Personal history of nicotine dependence: Secondary | ICD-10-CM | POA: Diagnosis not present

## 2019-10-05 DIAGNOSIS — C9110 Chronic lymphocytic leukemia of B-cell type not having achieved remission: Secondary | ICD-10-CM | POA: Diagnosis not present

## 2019-10-05 DIAGNOSIS — E119 Type 2 diabetes mellitus without complications: Secondary | ICD-10-CM | POA: Diagnosis not present

## 2019-10-05 DIAGNOSIS — I10 Essential (primary) hypertension: Secondary | ICD-10-CM | POA: Diagnosis not present

## 2019-10-05 DIAGNOSIS — N179 Acute kidney failure, unspecified: Secondary | ICD-10-CM | POA: Diagnosis not present

## 2019-10-05 DIAGNOSIS — Z794 Long term (current) use of insulin: Secondary | ICD-10-CM | POA: Diagnosis not present

## 2019-10-05 DIAGNOSIS — E876 Hypokalemia: Secondary | ICD-10-CM | POA: Diagnosis not present

## 2019-10-05 DIAGNOSIS — I4891 Unspecified atrial fibrillation: Secondary | ICD-10-CM | POA: Diagnosis not present

## 2019-10-05 DIAGNOSIS — M170 Bilateral primary osteoarthritis of knee: Secondary | ICD-10-CM | POA: Diagnosis not present

## 2019-10-08 DIAGNOSIS — I10 Essential (primary) hypertension: Secondary | ICD-10-CM | POA: Diagnosis not present

## 2019-10-08 DIAGNOSIS — Z87891 Personal history of nicotine dependence: Secondary | ICD-10-CM | POA: Diagnosis not present

## 2019-10-08 DIAGNOSIS — Z9181 History of falling: Secondary | ICD-10-CM | POA: Diagnosis not present

## 2019-10-08 DIAGNOSIS — C9110 Chronic lymphocytic leukemia of B-cell type not having achieved remission: Secondary | ICD-10-CM | POA: Diagnosis not present

## 2019-10-08 DIAGNOSIS — I4891 Unspecified atrial fibrillation: Secondary | ICD-10-CM | POA: Diagnosis not present

## 2019-10-08 DIAGNOSIS — M170 Bilateral primary osteoarthritis of knee: Secondary | ICD-10-CM | POA: Diagnosis not present

## 2019-10-08 DIAGNOSIS — N179 Acute kidney failure, unspecified: Secondary | ICD-10-CM | POA: Diagnosis not present

## 2019-10-08 DIAGNOSIS — Z794 Long term (current) use of insulin: Secondary | ICD-10-CM | POA: Diagnosis not present

## 2019-10-08 DIAGNOSIS — E870 Hyperosmolality and hypernatremia: Secondary | ICD-10-CM | POA: Diagnosis not present

## 2019-10-08 DIAGNOSIS — E119 Type 2 diabetes mellitus without complications: Secondary | ICD-10-CM | POA: Diagnosis not present

## 2019-10-08 DIAGNOSIS — E876 Hypokalemia: Secondary | ICD-10-CM | POA: Diagnosis not present

## 2019-10-11 DIAGNOSIS — Z794 Long term (current) use of insulin: Secondary | ICD-10-CM | POA: Diagnosis not present

## 2019-10-11 DIAGNOSIS — I10 Essential (primary) hypertension: Secondary | ICD-10-CM | POA: Diagnosis not present

## 2019-10-11 DIAGNOSIS — C9110 Chronic lymphocytic leukemia of B-cell type not having achieved remission: Secondary | ICD-10-CM | POA: Diagnosis not present

## 2019-10-11 DIAGNOSIS — E119 Type 2 diabetes mellitus without complications: Secondary | ICD-10-CM | POA: Diagnosis not present

## 2019-10-11 DIAGNOSIS — Z9181 History of falling: Secondary | ICD-10-CM | POA: Diagnosis not present

## 2019-10-11 DIAGNOSIS — M170 Bilateral primary osteoarthritis of knee: Secondary | ICD-10-CM | POA: Diagnosis not present

## 2019-10-11 DIAGNOSIS — I4891 Unspecified atrial fibrillation: Secondary | ICD-10-CM | POA: Diagnosis not present

## 2019-10-11 DIAGNOSIS — E870 Hyperosmolality and hypernatremia: Secondary | ICD-10-CM | POA: Diagnosis not present

## 2019-10-11 DIAGNOSIS — E876 Hypokalemia: Secondary | ICD-10-CM | POA: Diagnosis not present

## 2019-10-11 DIAGNOSIS — N179 Acute kidney failure, unspecified: Secondary | ICD-10-CM | POA: Diagnosis not present

## 2019-10-11 DIAGNOSIS — Z87891 Personal history of nicotine dependence: Secondary | ICD-10-CM | POA: Diagnosis not present

## 2019-10-12 DIAGNOSIS — Z87891 Personal history of nicotine dependence: Secondary | ICD-10-CM | POA: Diagnosis not present

## 2019-10-12 DIAGNOSIS — M170 Bilateral primary osteoarthritis of knee: Secondary | ICD-10-CM | POA: Diagnosis not present

## 2019-10-12 DIAGNOSIS — E870 Hyperosmolality and hypernatremia: Secondary | ICD-10-CM | POA: Diagnosis not present

## 2019-10-12 DIAGNOSIS — C9110 Chronic lymphocytic leukemia of B-cell type not having achieved remission: Secondary | ICD-10-CM | POA: Diagnosis not present

## 2019-10-12 DIAGNOSIS — I10 Essential (primary) hypertension: Secondary | ICD-10-CM | POA: Diagnosis not present

## 2019-10-12 DIAGNOSIS — I4891 Unspecified atrial fibrillation: Secondary | ICD-10-CM | POA: Diagnosis not present

## 2019-10-12 DIAGNOSIS — Z9181 History of falling: Secondary | ICD-10-CM | POA: Diagnosis not present

## 2019-10-12 DIAGNOSIS — E876 Hypokalemia: Secondary | ICD-10-CM | POA: Diagnosis not present

## 2019-10-12 DIAGNOSIS — N179 Acute kidney failure, unspecified: Secondary | ICD-10-CM | POA: Diagnosis not present

## 2019-10-12 DIAGNOSIS — E119 Type 2 diabetes mellitus without complications: Secondary | ICD-10-CM | POA: Diagnosis not present

## 2019-10-12 DIAGNOSIS — Z794 Long term (current) use of insulin: Secondary | ICD-10-CM | POA: Diagnosis not present

## 2019-10-13 ENCOUNTER — Emergency Department (HOSPITAL_COMMUNITY): Payer: Medicare Other

## 2019-10-13 ENCOUNTER — Encounter (HOSPITAL_COMMUNITY): Payer: Self-pay

## 2019-10-13 ENCOUNTER — Emergency Department (HOSPITAL_COMMUNITY)
Admission: EM | Admit: 2019-10-13 | Discharge: 2019-10-13 | Disposition: A | Discharge status: Home / Self Care | Payer: Medicare Other | Attending: Emergency Medicine | Admitting: Emergency Medicine

## 2019-10-13 ENCOUNTER — Other Ambulatory Visit: Payer: Self-pay

## 2019-10-13 DIAGNOSIS — Y929 Unspecified place or not applicable: Secondary | ICD-10-CM | POA: Diagnosis not present

## 2019-10-13 DIAGNOSIS — S80912A Unspecified superficial injury of left knee, initial encounter: Secondary | ICD-10-CM | POA: Diagnosis not present

## 2019-10-13 DIAGNOSIS — Z743 Need for continuous supervision: Secondary | ICD-10-CM | POA: Diagnosis not present

## 2019-10-13 DIAGNOSIS — W19XXXA Unspecified fall, initial encounter: Secondary | ICD-10-CM | POA: Diagnosis not present

## 2019-10-13 DIAGNOSIS — I10 Essential (primary) hypertension: Secondary | ICD-10-CM | POA: Diagnosis not present

## 2019-10-13 DIAGNOSIS — G8929 Other chronic pain: Secondary | ICD-10-CM | POA: Insufficient documentation

## 2019-10-13 DIAGNOSIS — M1712 Unilateral primary osteoarthritis, left knee: Secondary | ICD-10-CM | POA: Insufficient documentation

## 2019-10-13 DIAGNOSIS — M25562 Pain in left knee: Secondary | ICD-10-CM

## 2019-10-13 DIAGNOSIS — Y999 Unspecified external cause status: Secondary | ICD-10-CM | POA: Insufficient documentation

## 2019-10-13 DIAGNOSIS — S8992XA Unspecified injury of left lower leg, initial encounter: Secondary | ICD-10-CM | POA: Diagnosis not present

## 2019-10-13 DIAGNOSIS — R531 Weakness: Secondary | ICD-10-CM | POA: Diagnosis not present

## 2019-10-13 DIAGNOSIS — M7732 Calcaneal spur, left foot: Secondary | ICD-10-CM | POA: Diagnosis not present

## 2019-10-13 DIAGNOSIS — Y939 Activity, unspecified: Secondary | ICD-10-CM | POA: Insufficient documentation

## 2019-10-13 DIAGNOSIS — S99912A Unspecified injury of left ankle, initial encounter: Secondary | ICD-10-CM | POA: Diagnosis not present

## 2019-10-13 DIAGNOSIS — M19072 Primary osteoarthritis, left ankle and foot: Secondary | ICD-10-CM | POA: Diagnosis not present

## 2019-10-13 LAB — BASIC METABOLIC PANEL
Anion gap: 13 (ref 5–15)
BUN: 11 mg/dL (ref 8–23)
CO2: 23 mmol/L (ref 22–32)
Calcium: 8.9 mg/dL (ref 8.9–10.3)
Chloride: 103 mmol/L (ref 98–111)
Creatinine, Ser: 0.95 mg/dL (ref 0.61–1.24)
GFR calc Af Amer: >60 mL/min (ref >60)
GFR calc non Af Amer: >60 mL/min (ref >60)
Glucose, Bld: 162 mg/dL — ABNORMAL HIGH (ref 70–99)
Potassium: 3.4 mmol/L — ABNORMAL LOW (ref 3.5–5.1)
Sodium: 139 mmol/L (ref 135–145)

## 2019-10-13 LAB — CBC WITH DIFFERENTIAL/PLATELET
Basophils Absolute: 0 10*3/uL (ref 0.0–0.1)
Basophils Relative: 0 %
Eosinophils Absolute: 0 10*3/uL (ref 0.0–0.5)
Eosinophils Relative: 0 %
HCT: 39 % (ref 39.0–52.0)
Hemoglobin: 12.6 g/dL — ABNORMAL LOW (ref 13.0–17.0)
Lymphocytes Relative: 47 %
Lymphs Abs: 4.7 10*3/uL — ABNORMAL HIGH (ref 0.7–4.0)
MCH: 31.8 pg (ref 26.0–34.0)
MCHC: 32.3 g/dL (ref 30.0–36.0)
MCV: 98.5 fL (ref 80.0–100.0)
Monocytes Absolute: 1.4 10*3/uL — ABNORMAL HIGH (ref 0.1–1.0)
Monocytes Relative: 14 %
Neutro Abs: 3.9 10*3/uL (ref 1.7–7.7)
Neutrophils Relative %: 39 %
Platelets: 154 10*3/uL (ref 150–400)
RBC: 3.96 MIL/uL — ABNORMAL LOW (ref 4.22–5.81)
RDW: 15.6 % — ABNORMAL HIGH (ref 11.5–15.5)
WBC: 10.1 10*3/uL (ref 4.0–10.5)
nRBC: 0 % (ref 0.0–0.2)

## 2019-10-13 LAB — CK: Total CK: 32 U/L — ABNORMAL LOW (ref 49–397)

## 2019-10-13 MED ORDER — HYDROCODONE-ACETAMINOPHEN 5-325 MG PO TABS: 2.0000 | ORAL_TABLET | ORAL | 0 refills | Status: DC | PRN

## 2019-10-13 MED ORDER — HYDROCODONE-ACETAMINOPHEN 5-325 MG PO TABS: 1.0000 | ORAL_TABLET | ORAL | 0 refills | Status: DC | PRN

## 2019-10-13 MED ORDER — HYDROCODONE-ACETAMINOPHEN 5-325 MG PO TABS
1.0000 | ORAL_TABLET | Freq: Once | ORAL | Status: AC
  Administered 2019-10-13: 1 via ORAL
  Filled 2019-10-13: qty 1

## 2019-10-13 NOTE — ED Provider Notes (Addendum)
[facility] Provider Note   CSN: [id number] Arrival date & time: 10/13/19  1345     History Chief Complaint  Patient presents with  . Fall    Tim Duncan is a 69 y.o. male with a history as outlined below, most significant for insulin-dependent diabetes, CLL, paroxysmal atrial fibrillation on aspirin but not anticoagulation secondary to falls, hypertension with known severe left knee osteoarthritis which causes frequent falls who underwent recent hospitalization for similar fall causing 7 days lying on his floor before being found by a neighbor requiring hospitalization followed by skilled nursing placement for rehab, presents for evaluation of a another fall which occurred this morning.  He describe constant pain in the left knee which is worsened with weightbearing and which triggers collapse of this knee.  He fell this morning and was on the floor for about 4 hours prior to being checked on by a neighbor.  He does have a lifeline device but it did not work, he believes it is not charged.  He denies head injury with this fall, states fell forward landing directly on the left knee.  He was too weak to help himself up.  He is requesting assistance with placement in an assisted living facility.  HPI     Past Medical History:  Diagnosis Date  . Anemia 08/26/2012  . BPH (benign prostatic hyperplasia)   . Cancer ([place])    cll  . CLL (chronic lymphocytic leukemia) ([place])   . Hypertension    off meds  . Lymphadenopathy   . Lymphocytosis   . Thrombocytosis ([place])   . Tremors of nervous system   . Wears dentures    top    Patient Active Problem List   Diagnosis Date Noted  . AKI (acute kidney injury) ([place]) 09/14/2019  . Fall 09/14/2019  . Atrial fibrillation ([place]) 09/14/2019  . Varicose veins of lower extremities with complications 57/84/6962  . Chronic venous insufficiency 06/23/2014  . Multiple contusions 06/23/2014  . Synovial cyst 12/15/2013  . CLL  (chronic lymphocytic leukemia) ([place])   . Lymphadenopathy   . Lymphocytosis   . Thrombocytosis ([place])   . Anemia 08/26/2012    Past Surgical History:  Procedure Laterality Date  . CATARACT EXTRACTION W/PHACO Right 06/21/2014   Procedure: CATARACT EXTRACTION PHACO AND INTRAOCULAR LENS PLACEMENT; CDE:  11.10;  Surgeon: Rutherford Guys, MD;  Location: [facility];  Service: Ophthalmology;  Laterality: Right;  . CATARACT EXTRACTION W/PHACO Left 07/12/2014   Procedure: CATARACT EXTRACTION PHACO AND INTRAOCULAR LENS PLACEMENT (IOC);  Surgeon: Rutherford Guys, MD;  Location: [facility];  Service: Ophthalmology;  Laterality: Left;  CDE:7.52  . ENDOVENOUS ABLATION SAPHENOUS VEIN W/ LASER Right 12-08-2014   endovenous laser ablation (right greater saphenous vein) by Curt Jews MD   . FRACTURE SURGERY  1990   rt ring finger  . HERNIA REPAIR  1992   umb  . ORIF ANKLE FRACTURE  1995   right  . PORTACATH PLACEMENT  3/14  . SYNOVECTOMY Right 12/15/2013   Procedure: RIGHT RING FINGER FLEXOR SYNOVECTOMY;  Surgeon: Charlotte Crumb, MD;  Location: [facility];  Service: Orthopedics;  Laterality: Right;  . TONSILLECTOMY         Family History  Problem Relation Age of Onset  . Diabetes Mother     Social History   Tobacco Use  . Smoking status: Former Smoker    Packs/day: 2.00    Years: 20.00    Pack years: 40.00  Types: Cigarettes    Quit date: 12/13/1988    Years since quitting: 30.8  . Smokeless tobacco: Never Used  Substance Use Topics  . Alcohol use: No  . Drug use: No    Home Medications Prior to Admission medications   Medication Sig Start Date End Date Taking? Authorizing Provider  allopurinol (ZYLOPRIM) 300 MG tablet Take 300 mg by mouth daily.   Yes [provider]  amiodarone (PACERONE) 200 MG tablet Take 200 mg by mouth daily. 08/19/19  Yes [provider]  amLODipine (NORVASC) 5 MG tablet Take 10 mg by mouth daily.    Yes [provider]  aspirin  EC 81 MG EC tablet Take 1 tablet (81 mg total) by mouth daily. Swallow whole. 09/17/19  Yes Rai, Ripudeep K, MD  atorvastatin (LIPITOR) 80 MG tablet Take 80 mg by mouth daily. 08/19/19  Yes [provider]  cyanocobalamin (,VITAMIN B-12,) 1000 MCG/ML injection INJECT 1 ML INTO THE MUSCLE MONTHLY UNTIL FURTHER NOTICE FROM ELLIS MD 09/02/19  Yes [provider]  insulin glargine (LANTUS) 100 UNIT/ML injection Inject 0.3 mLs (30 Units total) into the skin daily. 09/17/19  Yes Rai, Ripudeep K, MD  irbesartan (AVAPRO) 300 MG tablet Take 300 mg by mouth daily.   Yes [provider]  metoprolol tartrate (LOPRESSOR) 100 MG tablet Take 100 mg by mouth 2 (two) times daily. 08/19/19  Yes [provider]  Multiple Vitamin (MULTIVITAMIN) tablet Take 1 tablet by mouth daily.   Yes [provider]  polyethylene glycol (MIRALAX / GLYCOLAX) 17 g packet Take 17 g by mouth daily as needed for mild constipation. 09/17/19  Yes Rai, Ripudeep K, MD  tamsulosin (FLOMAX) 0.4 MG CAPS capsule Take 0.4 mg by mouth daily after supper.   Yes [provider]  valACYclovir (VALTREX) 1000 MG tablet Take 1,000 mg by mouth daily as needed (outbreak).  07/20/19  Yes [provider]  venetoclax 100 MG TABS Take 200 mGy by mouth daily.  07/26/19  Yes [provider]  HYDROcodone-acetaminophen (NORCO/VICODIN) 5-325 MG tablet Take 1 tablet by mouth every 4 (four) hours as needed for moderate pain. 10/13/19   Evalee Jefferson, PA-C  insulin aspart (NOVOLOG) 100 UNIT/ML injection Inject 5 Units into the skin 3 (three) times daily with meals. Patient not taking: Reported on 10/13/2019 09/17/19   Rai, Vernelle Emerald, MD  insulin aspart (NOVOLOG) 100 UNIT/ML injection Inject 0-9 Units into the skin 3 (three) times daily with meals. Sliding scale CBG 70 - 120: 0 units CBG 121 - 150: 1 unit,  CBG 151 - 200: 2 units,  CBG 201 - 250: 3 units,  CBG 251 - 300: 5 units,  CBG 301 - 350: 7 units,  CBG 351  - 400: 9 units   CBG > 400: 9 units and notify your MD Patient not taking: Reported on 10/13/2019 09/17/19   Mendel Corning, MD    Allergies    Patient has no known allergies.  Review of Systems   Review of Systems  Constitutional: Negative for chills and fever.  HENT: Negative.   Respiratory: Negative.   Cardiovascular: Negative.   Gastrointestinal: Negative.   Musculoskeletal: Positive for arthralgias and joint swelling. Negative for myalgias.  Neurological: Positive for weakness. Negative for numbness and headaches.    Physical Exam Updated Vital Signs BP (!) 149/83   Pulse 73   Temp 98.2 F (36.8 C) (Oral)   Resp (!) 24   Ht 6' (1.829 m)  Wt (!) 141.5 kg   SpO2 92%   BMI 42.31 kg/m   Physical Exam Vitals and nursing note reviewed.  Constitutional:      Appearance: He is well-developed.  HENT:     Head: Normocephalic and atraumatic.  Eyes:     Conjunctiva/sclera: Conjunctivae normal.  Cardiovascular:     Rate and Rhythm: Normal rate and regular rhythm.     Heart sounds: Normal heart sounds.  Pulmonary:     Effort: Pulmonary effort is normal.     Breath sounds: Normal breath sounds. No wheezing.  Abdominal:     General: Bowel sounds are normal.     Palpations: Abdomen is soft.     Tenderness: There is no abdominal tenderness.  Musculoskeletal:        General: Swelling and tenderness present. No deformity. Normal range of motion.     Cervical back: Normal range of motion.     Left knee: No deformity. Tenderness present. No LCL laxity, MCL laxity, ACL laxity or PCL laxity.    Comments: Tender to palpation left knee joint space both medial and lateral.  There is no laxity.  Dorsalis pedal pulse is full.  Skin:    General: Skin is warm and dry.  Neurological:     Mental Status: He is alert.     ED Results / Procedures / Treatments   Labs (all labs ordered are listed, but only abnormal results are displayed) Labs Reviewed  CBC WITH DIFFERENTIAL/PLATELET -  Abnormal; Notable for the following components:      Result Value   RBC 3.96 (*)    Hemoglobin 12.6 (*)    RDW 15.6 (*)    Lymphs Abs 4.7 (*)    Monocytes Absolute 1.4 (*)    All other components within normal limits  BASIC METABOLIC PANEL - Abnormal; Notable for the following components:   Potassium 3.4 (*)    Glucose, Bld 162 (*)    All other components within normal limits  CK - Abnormal; Notable for the following components:   Total CK 32 (*)    All other components within normal limits    EKG EKG Interpretation  Date/Time:  Wednesday October 13 2019 14:06:04 EDT Ventricular Rate:  77 PR Interval:    QRS Duration: 92 QT Interval:  452 QTC Calculation: 512 R Axis:   -41 Text Interpretation: Normal sinus rhythm Left anterior fascicular block Borderline repolarization abnormality Prolonged QT interval Confirmed by [name] [id number]) on 10/13/2019 3:46:25 PM   Radiology DG Ankle Complete Left  Result Date: 10/13/2019 CLINICAL DATA:  [place], pain EXAM: LEFT KNEE - COMPLETE 4+ VIEW; LEFT ANKLE COMPLETE - 3+ VIEW COMPARISON:  09/09/2019 FINDINGS: Left knee: Frontal, bilateral oblique, lateral views of the left knee demonstrate stable severe 3 compartmental osteoarthritis. No fracture, subluxation, or dislocation. No joint effusion. Left ankle: Frontal, oblique, lateral views demonstrate no fractures. Alignment is anatomic. Mild midfoot osteoarthritis. Inferior calcaneal spur. Soft tissues are normal. IMPRESSION: 1. Severe 3 compartmental osteoarthritis of the left knee. No acute fracture. 2. Unremarkable left ankle. Electronically Signed   By: Randa Ngo M.D.   On: 10/13/2019 15:24   DG Knee Complete 4 Views Left  Result Date: 10/13/2019 CLINICAL DATA:  [place], pain EXAM: LEFT KNEE - COMPLETE 4+ VIEW; LEFT ANKLE COMPLETE - 3+ VIEW COMPARISON:  09/09/2019 FINDINGS: Left knee: Frontal, bilateral oblique, lateral views of the left knee demonstrate stable severe 3 compartmental  osteoarthritis. No fracture, subluxation, or dislocation. No joint effusion. Left  ankle: Frontal, oblique, lateral views demonstrate no fractures. Alignment is anatomic. Mild midfoot osteoarthritis. Inferior calcaneal spur. Soft tissues are normal. IMPRESSION: 1. Severe 3 compartmental osteoarthritis of the left knee. No acute fracture. 2. Unremarkable left ankle. Electronically Signed   By: Randa Ngo M.D.   On: 10/13/2019 15:24    Procedures Procedures (including critical care time)  Medications Ordered in ED Medications  HYDROcodone-acetaminophen (NORCO/VICODIN) 5-325 MG per tablet 1 tablet (1 tablet Oral Given 10/13/19 1748)    ED Course  I have reviewed the triage vital signs and the nursing notes.  Pertinent labs & imaging results that were available during my care of the patient were reviewed by me and considered in my medical decision making (see chart for details).    MDM Rules/Calculators/A&P                          Imaging reviewed and discussed with patient.  Attempt to ambulate patient in the department because increased pain in the left knee.  He was able to weight-bear but unable to move forward secondary to pain.  He was given hydrocodone, reattempt at walking, this time using a walker which he usually uses at home.  He was able to ambulate in his room to the door turn and back to his bed without any difficulty or unsteady gait.  He does endorse pain in the knee but more tolerable.  He was placed in a knee immobilizer to give the knee better support.  There is no obvious weakness in the knee on exam, I suspect it is a pain issue that causes the knee to collapse with weightbearing.  He was given referral to Dr. Aline Brochure for further management of this painful knee.  He was strongly encouraged to use his walker at all times to make his gait safer.  He was also seen by transition of care team prior to discharge home as he expresses interest in being placed in assisted living.  He  was given information and recommendations regarding how to make this happen.  There is no obvious indication for admission here today.  He was prescribed a small quantity of hydrocodone for home use.  We also discussed elevation and warm compresses to the knee.  Of note, prior to discharge patient was asked about pharmacy use.  He uses [facility] clinical out of [place], states his medications are usually received within a week.  He resisted having his hydrocodone prescribed to a local pharmacy.  He was therefore given a prepack of hydrocodone to help with his pain while he is awaiting this prescription.   Final Clinical Impression(s) / ED Diagnoses Final diagnoses:  Fall, initial encounter  Chronic pain of left knee  Primary osteoarthritis of left knee    Rx / DC Orders ED Discharge Orders         Ordered    HYDROcodone-acetaminophen (NORCO/VICODIN) 5-325 MG tablet  Every 4 hours PRN     Discontinue  Reprint     10/13/19 1829           Evalee Jefferson, PA-C 10/13/19 1854    Evalee Jefferson, PA-C 10/13/19 Cristino Martes, MD 10/14/19 1755

## 2019-10-13 NOTE — ED Notes (Signed)
Ambulated pt. With walker. Pt. Was able to stand and walk 30 ft and than turn around and walk 30 ft back to bed.

## 2019-10-13 NOTE — ED Notes (Addendum)
Pt. States they use a walker at home and has been having cramping issues in the calf of the left leg.

## 2019-10-13 NOTE — ED Notes (Signed)
Attempted to ambulate pt. In room. Pt. Was able to stand but pt. Stated they could not walk around chair.

## 2019-10-13 NOTE — Discharge Instructions (Signed)
Wear the knee immobilizer given to help support your knee.  You may use the hydrocodone if needed for increased pain relief, however use caution with this medication as it will make you drowsy.  Do not drive within 4 hours of taking this medication.  Elevation and warm compresses may also help with your knee pain.  Call Dr. Aline Brochure for further evaluation of this severe arthritis.  Use your walker at all times at home to help prevent further falls.

## 2019-10-13 NOTE — Clinical Social Work Note (Signed)
Transition of Care [facility]) - Emergency Department Mini Assessment  Patient Details  Name: Tim Duncan MRN: [id number] Date of Birth: 08-Jun-1950  [facility] CM/SW Contact:    Sherie Don, LCSW Phone Number: 10/13/2019, 4:53 PM  Clinical Narrative: Patient is a 69 year old male who presented to the ED due to weakness. Patient discharged from [facility] for SNF last week. TOC received consult for assistance with placement. CSW met with patient in ED and provided patient with list of family care homes and ALFs. Patient reported he cannot afford private pay for ALFs. CSW explained patient will have to sign over his monthly check for other facilities and will need to contact the [facility] and [facility] facilities to see if there are beds available. Patient agreeable to CSW making referral to financial counselor to determine Medicaid eligibility. CSW made referral to [facility] [place]. TOC signing off.  ED Mini Assessment: What brought you to the Emergency Department? : Weakness Barriers to Discharge: ED Barriers Resolved Barrier interventions: Referred to financial counselor, given list of family care homes in [facility] of departure: Ambulance Interventions which prevented an admission or readmission: Other (must enter comment) (Family care home resources; referral to financial counselor)  Patient Contact and Communications Key Contact 1: Financial counselor Tito Dine) Contact Date: 10/13/19,  Patient states their goals for this hospitalization and ongoing recovery are:: Find long-term placement CMS Medicare.gov Compare Post Acute Care list provided to:: Patient Choice offered to / list presented to : Patient  Admission diagnosis:  fall Patient Active Problem List   Diagnosis Date Noted  . AKI (acute kidney injury) ([place]) 09/14/2019  . Fall 09/14/2019  . Atrial fibrillation ([place]) 09/14/2019  . Varicose veins of lower extremities with complications 38/33/3832  . Chronic venous  insufficiency 06/23/2014  . Multiple contusions 06/23/2014  . Synovial cyst 12/15/2013  . CLL (chronic lymphocytic leukemia) ([place])   . Lymphadenopathy   . Lymphocytosis   . Thrombocytosis ([place])   . Anemia 08/26/2012   PCP:  Celene Squibb, MD [facility]acy:   [facility],  - [address] [place] [address] [place] [address] [place] [address] Phone: [phone] Fax: [phone]  [facility], [place], [place] - [address] [place] [address] [place] [address] Phone: [phone] Fax: [place], [place] [address] Phone: [phone] Fax: [phone]  [place], [place], [place] [address] [place], [address] [place] [address] Phone: [phone] Fax: [phone]

## 2019-10-13 NOTE — ED Triage Notes (Signed)
EMS reports pt released from [facility] last week.  Reports feeling generalized weakness today.  Reports left knee "gave out" and he fell today.  Denies hitting head, no loss of consciousness.  Pt requesting placement in an assisted living facility.

## 2019-10-14 ENCOUNTER — Other Ambulatory Visit: Payer: Self-pay

## 2019-10-14 ENCOUNTER — Observation Stay (HOSPITAL_COMMUNITY)
Admission: EM | Admit: 2019-10-14 | Discharge: 2019-10-17 | Disposition: A | Discharge status: Home / Self Care | Payer: Medicare Other | Attending: Internal Medicine | Admitting: Internal Medicine

## 2019-10-14 ENCOUNTER — Encounter (HOSPITAL_COMMUNITY): Payer: Self-pay | Admitting: Emergency Medicine

## 2019-10-14 ENCOUNTER — Emergency Department (HOSPITAL_COMMUNITY): Payer: Medicare Other

## 2019-10-14 DIAGNOSIS — I959 Hypotension, unspecified: Secondary | ICD-10-CM | POA: Diagnosis not present

## 2019-10-14 DIAGNOSIS — C9110 Chronic lymphocytic leukemia of B-cell type not having achieved remission: Principal | ICD-10-CM | POA: Diagnosis present

## 2019-10-14 DIAGNOSIS — E876 Hypokalemia: Secondary | ICD-10-CM | POA: Diagnosis not present

## 2019-10-14 DIAGNOSIS — N179 Acute kidney failure, unspecified: Secondary | ICD-10-CM | POA: Diagnosis not present

## 2019-10-14 DIAGNOSIS — E1165 Type 2 diabetes mellitus with hyperglycemia: Secondary | ICD-10-CM | POA: Diagnosis not present

## 2019-10-14 DIAGNOSIS — M1990 Unspecified osteoarthritis, unspecified site: Secondary | ICD-10-CM

## 2019-10-14 DIAGNOSIS — M1712 Unilateral primary osteoarthritis, left knee: Secondary | ICD-10-CM | POA: Diagnosis not present

## 2019-10-14 DIAGNOSIS — M170 Bilateral primary osteoarthritis of knee: Secondary | ICD-10-CM | POA: Diagnosis not present

## 2019-10-14 DIAGNOSIS — W1839XA Other fall on same level, initial encounter: Secondary | ICD-10-CM | POA: Insufficient documentation

## 2019-10-14 DIAGNOSIS — Y999 Unspecified external cause status: Secondary | ICD-10-CM | POA: Insufficient documentation

## 2019-10-14 DIAGNOSIS — Z20822 Contact with and (suspected) exposure to covid-19: Secondary | ICD-10-CM | POA: Diagnosis not present

## 2019-10-14 DIAGNOSIS — D649 Anemia, unspecified: Secondary | ICD-10-CM

## 2019-10-14 DIAGNOSIS — R531 Weakness: Secondary | ICD-10-CM | POA: Diagnosis not present

## 2019-10-14 DIAGNOSIS — I10 Essential (primary) hypertension: Secondary | ICD-10-CM | POA: Diagnosis not present

## 2019-10-14 DIAGNOSIS — Z87891 Personal history of nicotine dependence: Secondary | ICD-10-CM | POA: Diagnosis not present

## 2019-10-14 DIAGNOSIS — W19XXXA Unspecified fall, initial encounter: Secondary | ICD-10-CM | POA: Diagnosis present

## 2019-10-14 DIAGNOSIS — R17 Unspecified jaundice: Secondary | ICD-10-CM

## 2019-10-14 DIAGNOSIS — E870 Hyperosmolality and hypernatremia: Secondary | ICD-10-CM | POA: Diagnosis not present

## 2019-10-14 DIAGNOSIS — Z794 Long term (current) use of insulin: Secondary | ICD-10-CM | POA: Insufficient documentation

## 2019-10-14 DIAGNOSIS — R739 Hyperglycemia, unspecified: Secondary | ICD-10-CM

## 2019-10-14 DIAGNOSIS — E872 Acidosis, unspecified: Secondary | ICD-10-CM

## 2019-10-14 DIAGNOSIS — R7401 Elevation of levels of liver transaminase levels: Secondary | ICD-10-CM

## 2019-10-14 DIAGNOSIS — R52 Pain, unspecified: Secondary | ICD-10-CM | POA: Diagnosis not present

## 2019-10-14 DIAGNOSIS — Z7982 Long term (current) use of aspirin: Secondary | ICD-10-CM | POA: Diagnosis not present

## 2019-10-14 DIAGNOSIS — I4891 Unspecified atrial fibrillation: Secondary | ICD-10-CM | POA: Diagnosis present

## 2019-10-14 DIAGNOSIS — Y939 Activity, unspecified: Secondary | ICD-10-CM | POA: Diagnosis not present

## 2019-10-14 DIAGNOSIS — E119 Type 2 diabetes mellitus without complications: Secondary | ICD-10-CM | POA: Diagnosis not present

## 2019-10-14 DIAGNOSIS — Y929 Unspecified place or not applicable: Secondary | ICD-10-CM | POA: Diagnosis not present

## 2019-10-14 DIAGNOSIS — R404 Transient alteration of awareness: Secondary | ICD-10-CM | POA: Diagnosis not present

## 2019-10-14 DIAGNOSIS — E669 Obesity, unspecified: Secondary | ICD-10-CM

## 2019-10-14 DIAGNOSIS — Z9181 History of falling: Secondary | ICD-10-CM | POA: Diagnosis not present

## 2019-10-14 DIAGNOSIS — Z743 Need for continuous supervision: Secondary | ICD-10-CM | POA: Diagnosis not present

## 2019-10-14 DIAGNOSIS — R9431 Abnormal electrocardiogram [ECG] [EKG]: Secondary | ICD-10-CM

## 2019-10-14 LAB — CBG MONITORING, ED: Glucose-Capillary: 240 mg/dL — ABNORMAL HIGH (ref 70–99)

## 2019-10-14 MED ORDER — LACTATED RINGERS IV BOLUS
1000.0000 mL | Freq: Once | INTRAVENOUS | Status: AC
  Administered 2019-10-14: 1000 mL via INTRAVENOUS

## 2019-10-14 MED ORDER — SODIUM CHLORIDE 0.9% FLUSH
3.0000 mL | Freq: Once | INTRAVENOUS | Status: AC
  Administered 2019-10-14: 3 mL via INTRAVENOUS

## 2019-10-14 MED FILL — Hydrocodone-Acetaminophen Tab 5-325 MG: ORAL | Qty: 6 | Status: AC

## 2019-10-14 NOTE — ED Triage Notes (Signed)
Ems called to help get pt out of chair. Pt is unable to walk due to weakness and lives at home by himself. Pt needs placement in facility. Ems states pt's home is unliveable.

## 2019-10-14 NOTE — ED Provider Notes (Signed)
[facility] EMERGENCY DEPARTMENT Provider Note   CSN: [id number] Arrival date & time: 10/14/19  2235   History Chief Complaint  Patient presents with  . Weakness    Tim Duncan is a 69 y.o. male.  The history is provided by the patient.  Weakness He has history of atrial fibrillation, chronic lymphocytic leukemia, hypertension but not on medication and comes in because of weakness.  He has arthritis in his left knee.  He states he fell off of his chair and was unable to get himself back up.  He called for an ambulance and they brought him in here.  He is complaining of arthritic pain in his left knee which is unchanged from baseline.  He denies abdominal pain, chest pain, back pain.  He denies nausea or vomiting.  There is a very slight cough which is nonproductive.  He had been in the emergency department yesterday and is working with social service to get moved into a long-term care facility.  Past Medical History:  Diagnosis Date  . Anemia 08/26/2012  . BPH (benign prostatic hyperplasia)   . Cancer ([place])    cll  . CLL (chronic lymphocytic leukemia) ([place])   . Hypertension    off meds  . Lymphadenopathy   . Lymphocytosis   . Thrombocytosis ([place])   . Tremors of nervous system   . Wears dentures    top    Patient Active Problem List   Diagnosis Date Noted  . AKI (acute kidney injury) ([place]) 09/14/2019  . Fall 09/14/2019  . Atrial fibrillation ([place]) 09/14/2019  . Varicose veins of lower extremities with complications 93/26/7124  . Chronic venous insufficiency 06/23/2014  . Multiple contusions 06/23/2014  . Synovial cyst 12/15/2013  . CLL (chronic lymphocytic leukemia) ([place])   . Lymphadenopathy   . Lymphocytosis   . Thrombocytosis ([place])   . Anemia 08/26/2012    Past Surgical History:  Procedure Laterality Date  . CATARACT EXTRACTION W/PHACO Right 06/21/2014   Procedure: CATARACT EXTRACTION PHACO AND INTRAOCULAR LENS PLACEMENT; CDE:  11.10;  Surgeon: Rutherford Guys,  MD;  Location: [facility];  Service: Ophthalmology;  Laterality: Right;  . CATARACT EXTRACTION W/PHACO Left 07/12/2014   Procedure: CATARACT EXTRACTION PHACO AND INTRAOCULAR LENS PLACEMENT (IOC);  Surgeon: Rutherford Guys, MD;  Location: [facility];  Service: Ophthalmology;  Laterality: Left;  CDE:7.52  . ENDOVENOUS ABLATION SAPHENOUS VEIN W/ LASER Right 12-08-2014   endovenous laser ablation (right greater saphenous vein) by Curt Jews MD   . FRACTURE SURGERY  1990   rt ring finger  . HERNIA REPAIR  1992   umb  . ORIF ANKLE FRACTURE  1995   right  . PORTACATH PLACEMENT  3/14  . SYNOVECTOMY Right 12/15/2013   Procedure: RIGHT RING FINGER FLEXOR SYNOVECTOMY;  Surgeon: [place] Crumb, MD;  Location: [place];  Service: Orthopedics;  Laterality: Right;  . TONSILLECTOMY         Family History  Problem Relation Age of Onset  . Diabetes Mother     Social History   Tobacco Use  . Smoking status: Former Smoker    Packs/day: 2.00    Years: 20.00    Pack years: 40.00    Types: Cigarettes    Quit date: 12/13/1988    Years since quitting: 30.8  . Smokeless tobacco: Never Used  Substance Use Topics  . Alcohol use: No  . Drug use: No    Home Medications Prior to Admission medications   Medication Sig Start  Date End Date Taking? Authorizing Provider  allopurinol (ZYLOPRIM) 300 MG tablet Take 300 mg by mouth daily.    [provider]  amiodarone (PACERONE) 200 MG tablet Take 200 mg by mouth daily. 08/19/19   [provider]  amLODipine (NORVASC) 5 MG tablet Take 10 mg by mouth daily.     [provider]  aspirin EC 81 MG EC tablet Take 1 tablet (81 mg total) by mouth daily. Swallow whole. 09/17/19   Rai, Vernelle Emerald, MD  atorvastatin (LIPITOR) 80 MG tablet Take 80 mg by mouth daily. 08/19/19   [provider]  cyanocobalamin (,VITAMIN B-12,) 1000 MCG/ML injection INJECT 1 ML INTO THE MUSCLE MONTHLY UNTIL FURTHER NOTICE FROM ELLIS MD 09/02/19    [provider]  HYDROcodone-acetaminophen (NORCO/VICODIN) 5-325 MG tablet Take 1 tablet by mouth every 4 (four) hours as needed for moderate pain. 10/13/19   Evalee Jefferson, PA-C  HYDROcodone-acetaminophen (NORCO/VICODIN) 5-325 MG tablet Take 2 tablets by mouth every 4 (four) hours as needed. 10/13/19   Evalee Jefferson, PA-C  insulin aspart (NOVOLOG) 100 UNIT/ML injection Inject 5 Units into the skin 3 (three) times daily with meals. Patient not taking: Reported on 10/13/2019 09/17/19   Rai, Vernelle Emerald, MD  insulin aspart (NOVOLOG) 100 UNIT/ML injection Inject 0-9 Units into the skin 3 (three) times daily with meals. Sliding scale CBG 70 - 120: 0 units CBG 121 - 150: 1 unit,  CBG 151 - 200: 2 units,  CBG 201 - 250: 3 units,  CBG 251 - 300: 5 units,  CBG 301 - 350: 7 units,  CBG 351 - 400: 9 units   CBG > 400: 9 units and notify your MD Patient not taking: Reported on 10/13/2019 09/17/19   Rai, Ripudeep K, MD  insulin glargine (LANTUS) 100 UNIT/ML injection Inject 0.3 mLs (30 Units total) into the skin daily. 09/17/19   Rai, Ripudeep K, MD  irbesartan (AVAPRO) 300 MG tablet Take 300 mg by mouth daily.    [provider]  metoprolol tartrate (LOPRESSOR) 100 MG tablet Take 100 mg by mouth 2 (two) times daily. 08/19/19   [provider]  Multiple Vitamin (MULTIVITAMIN) tablet Take 1 tablet by mouth daily.    [provider]  polyethylene glycol (MIRALAX / GLYCOLAX) 17 g packet Take 17 g by mouth daily as needed for mild constipation. 09/17/19   Rai, Vernelle Emerald, MD  tamsulosin (FLOMAX) 0.4 MG CAPS capsule Take 0.4 mg by mouth daily after supper.    [provider]  valACYclovir (VALTREX) 1000 MG tablet Take 1,000 mg by mouth daily as needed (outbreak).  07/20/19   [provider]  venetoclax 100 MG TABS Take 200 mGy by mouth daily.  07/26/19   [provider]    Allergies    Patient has no known allergies.  Review of Systems   Review of Systems   Neurological: Positive for weakness.  All other systems reviewed and are negative.   Physical Exam Updated Vital Signs BP (!) 74/43   Pulse 85   Temp 97.9 F (36.6 C) (Axillary)   Resp 18   Ht 6' (1.829 m)   Wt (!) 141.5 kg   SpO2 93%   BMI 42.31 kg/m   Physical Exam Vitals and nursing note reviewed.   69 year old male, resting comfortably and in no acute distress. Vital signs are significant for low blood pressure. Oxygen saturation is 93%, which is normal. Head is normocephalic and atraumatic. PERRLA, EOMI. Oropharynx  is clear. Neck is nontender and supple without adenopathy or JVD. Back is nontender and there is no CVA tenderness. Lungs are clear without rales, wheezes, or rhonchi. Chest is nontender. Heart has regular rate and rhythm without murmur. Abdomen is soft, flat, nontender without masses or hepatosplenomegaly and peristalsis is normoactive. Extremities have 2-3+ edema with moderate venous stasis changes bilaterally, full range of motion is present. Skin is warm and dry without rash. Neurologic: Mental status is normal, cranial nerves are intact, there are no motor or sensory deficits.  ED Results / Procedures / Treatments   Labs (all labs ordered are listed, but only abnormal results are displayed) Labs Reviewed  COMPREHENSIVE METABOLIC PANEL - Abnormal; Notable for the following components:      Result Value   Potassium 3.4 (*)    CO2 20 (*)    Glucose, Bld 237 (*)    Creatinine, Ser 2.42 (*)    AST 57 (*)    ALT 48 (*)    Total Bilirubin 1.6 (*)    GFR calc non Af Amer 26 (*)    GFR calc Af Amer 31 (*)    Anion gap 16 (*)    All other components within normal limits  CBC WITH DIFFERENTIAL/PLATELET - Abnormal; Notable for the following components:   WBC 11.1 (*)    RBC 3.97 (*)    Hemoglobin 12.4 (*)    RDW 15.8 (*)    Monocytes Absolute 2.5 (*)    All other components within normal limits  LACTIC ACID, PLASMA - Abnormal; Notable for the  following components:   Lactic Acid, Venous 4.6 (*)    All other components within normal limits  CBG MONITORING, ED - Abnormal; Notable for the following components:   Glucose-Capillary 240 (*)    All other components within normal limits  SARS CORONAVIRUS 2 BY RT PCR (HOSPITAL ORDER, [place] [facility])  URINALYSIS, ROUTINE W REFLEX MICROSCOPIC  LACTIC ACID, PLASMA    EKG EKG Interpretation  Date/Time:  Thursday October 14 2019 23:12:55 EDT Ventricular Rate:  81 PR Interval:    QRS Duration: 85 QT Interval:  445 QTC Calculation: 517 R Axis:   15 Text Interpretation: Sinus rhythm Nonspecific T abnormalities, lateral leads Prolonged QT interval When compared with ECG of 10/13/2019, No significant change was found Confirmed by Delora Fuel ([id number]) on 10/15/2019 12:48:30 AM   Radiology DG Ankle Complete Left  Result Date: 10/13/2019 CLINICAL DATA:  [place], pain EXAM: LEFT KNEE - COMPLETE 4+ VIEW; LEFT ANKLE COMPLETE - 3+ VIEW COMPARISON:  09/09/2019 FINDINGS: Left knee: Frontal, bilateral oblique, lateral views of the left knee demonstrate stable severe 3 compartmental osteoarthritis. No fracture, subluxation, or dislocation. No joint effusion. Left ankle: Frontal, oblique, lateral views demonstrate no fractures. Alignment is anatomic. Mild midfoot osteoarthritis. Inferior calcaneal spur. Soft tissues are normal. IMPRESSION: 1. Severe 3 compartmental osteoarthritis of the left knee. No acute fracture. 2. Unremarkable left ankle. Electronically Signed   By: Randa Ngo M.D.   On: 10/13/2019 15:24   DG Chest Port 1 View  Result Date: 10/14/2019 CLINICAL DATA:  Hypotension, weakness, leukemia, previous tobacco abuse EXAM: PORTABLE CHEST 1 VIEW COMPARISON:  09/14/2019 FINDINGS: 2 frontal views of the chest demonstrate a stable cardiac silhouette. No airspace disease, effusion, or pneumothorax. No acute bony abnormalities. IMPRESSION: 1. No acute intrathoracic process.  Electronically Signed   By: Randa Ngo M.D.   On: 10/14/2019 23:59   DG Knee Complete 4 Views Left  Result Date: 10/13/2019 CLINICAL DATA:  [place], pain EXAM: LEFT KNEE - COMPLETE 4+ VIEW; LEFT ANKLE COMPLETE - 3+ VIEW COMPARISON:  09/09/2019 FINDINGS: Left knee: Frontal, bilateral oblique, lateral views of the left knee demonstrate stable severe 3 compartmental osteoarthritis. No fracture, subluxation, or dislocation. No joint effusion. Left ankle: Frontal, oblique, lateral views demonstrate no fractures. Alignment is anatomic. Mild midfoot osteoarthritis. Inferior calcaneal spur. Soft tissues are normal. IMPRESSION: 1. Severe 3 compartmental osteoarthritis of the left knee. No acute fracture. 2. Unremarkable left ankle. Electronically Signed   By: Randa Ngo M.D.   On: 10/13/2019 15:24    Procedures Procedures  CRITICAL CARE Performed by: Delora Fuel Total critical care time: 190 minutes Critical care time was exclusive of separately billable procedures and treating other patients. Critical care was necessary to treat or prevent imminent or life-threatening deterioration. Critical care was time spent personally by me on the following activities: development of treatment plan with patient and/or surrogate as well as nursing, discussions with consultants, evaluation of patient's response to treatment, examination of patient, obtaining history from patient or surrogate, ordering and performing treatments and interventions, ordering and review of laboratory studies, ordering and review of radiographic studies, pulse oximetry and re-evaluation of patient's condition.  Medications Ordered in ED Medications  potassium chloride SA (KLOR-CON) CR tablet 40 mEq (has no administration in time range)  sodium chloride flush (NS) 0.9 % injection 3 mL (3 mLs Intravenous Given 10/14/19 2329)  lactated ringers bolus 1,000 mL (0 mLs Intravenous Stopped 10/15/19 0033)  lactated ringers bolus 1,000 mL (0 mLs  Intravenous Stopped 10/15/19 0121)  lactated ringers bolus 1,000 mL (1,000 mLs Intravenous New Bag/Given 10/15/19 0121)    ED Course  I have reviewed the triage vital signs and the nursing notes.  Pertinent labs & imaging results that were available during my care of the patient were reviewed by me and considered in my medical decision making (see chart for details).  MDM Rules/Calculators/A&P Weakness secondary to hypotension.  Cause for hypotension is not clear.  In spite of low blood pressure, patient is completely nontoxic in appearance.  He will be given IV hydration and screening labs obtained.  Will check for occult infection with chest x-ray and urinalysis.  Old records are reviewed, and he was in the ED yesterday following a fall, but blood pressure was normal.  Labs showed mild anemia but were otherwise unremarkable.  He also had been hospitalized June 15 following a fall and had an acute kidney injury at that time.  12:20 AM Blood pressure has only come up to 81/48, will give additional fluid.  1:10 AM Blood pressure still not improving. Lactate is elevated, felt to be secondary to hypotension, not sepsis. Will give additional fluid.  2:83 AM Metabolic panel shows evidence of acute kidney injury, mild hypokalemia.  He is given a dose of oral potassium.  Also, mild elevation of transaminases not significantly changed from prior.  Mild elevation of bilirubin noted of uncertain significance.  CBC shows mild anemia which is stable.  With third liter of lactated Ringer's, blood pressure has come up over 100.  Case is discussed with Dr. Josephine Cables of [facility], who agrees to admit the patient.   Final Clinical Impression(s) / ED Diagnoses Final diagnoses:  Hypotension, unspecified hypotension type  Acute kidney injury (nontraumatic) (HCC)  Hypokalemia  Normochromic normocytic anemia  Elevated transaminase level    Rx / DC Orders ED Discharge Orders    None  Delora Fuel, MD 34/37/35 [phone]

## 2019-10-15 DIAGNOSIS — Z6841 Body Mass Index (BMI) 40.0 and over, adult: Secondary | ICD-10-CM

## 2019-10-15 DIAGNOSIS — R739 Hyperglycemia, unspecified: Secondary | ICD-10-CM

## 2019-10-15 DIAGNOSIS — M1990 Unspecified osteoarthritis, unspecified site: Secondary | ICD-10-CM

## 2019-10-15 DIAGNOSIS — C9110 Chronic lymphocytic leukemia of B-cell type not having achieved remission: Secondary | ICD-10-CM

## 2019-10-15 DIAGNOSIS — I959 Hypotension, unspecified: Secondary | ICD-10-CM

## 2019-10-15 DIAGNOSIS — N179 Acute kidney failure, unspecified: Secondary | ICD-10-CM | POA: Diagnosis not present

## 2019-10-15 DIAGNOSIS — W19XXXA Unspecified fall, initial encounter: Secondary | ICD-10-CM

## 2019-10-15 DIAGNOSIS — E872 Acidosis, unspecified: Secondary | ICD-10-CM

## 2019-10-15 DIAGNOSIS — I480 Paroxysmal atrial fibrillation: Secondary | ICD-10-CM

## 2019-10-15 DIAGNOSIS — E119 Type 2 diabetes mellitus without complications: Secondary | ICD-10-CM

## 2019-10-15 DIAGNOSIS — E876 Hypokalemia: Secondary | ICD-10-CM | POA: Diagnosis not present

## 2019-10-15 DIAGNOSIS — E669 Obesity, unspecified: Secondary | ICD-10-CM

## 2019-10-15 DIAGNOSIS — D649 Anemia, unspecified: Secondary | ICD-10-CM | POA: Diagnosis not present

## 2019-10-15 DIAGNOSIS — I10 Essential (primary) hypertension: Secondary | ICD-10-CM

## 2019-10-15 DIAGNOSIS — R9431 Abnormal electrocardiogram [ECG] [EKG]: Secondary | ICD-10-CM

## 2019-10-15 DIAGNOSIS — N1832 Chronic kidney disease, stage 3b: Secondary | ICD-10-CM

## 2019-10-15 LAB — CBC WITH DIFFERENTIAL/PLATELET
Abs Immature Granulocytes: 0.06 10*3/uL (ref 0.00–0.07)
Basophils Absolute: 0 10*3/uL (ref 0.0–0.1)
Basophils Relative: 0 %
Eosinophils Absolute: 0 10*3/uL (ref 0.0–0.5)
Eosinophils Relative: 0 %
HCT: 39.1 % (ref 39.0–52.0)
Hemoglobin: 12.4 g/dL — ABNORMAL LOW (ref 13.0–17.0)
Immature Granulocytes: 1 %
Lymphocytes Relative: 30 %
Lymphs Abs: 3.3 10*3/uL (ref 0.7–4.0)
MCH: 31.2 pg (ref 26.0–34.0)
MCHC: 31.7 g/dL (ref 30.0–36.0)
MCV: 98.5 fL (ref 80.0–100.0)
Monocytes Absolute: 2.5 10*3/uL — ABNORMAL HIGH (ref 0.1–1.0)
Monocytes Relative: 22 %
Neutro Abs: 5.2 10*3/uL (ref 1.7–7.7)
Neutrophils Relative %: 47 %
Platelets: 194 10*3/uL (ref 150–400)
RBC: 3.97 MIL/uL — ABNORMAL LOW (ref 4.22–5.81)
RDW: 15.8 % — ABNORMAL HIGH (ref 11.5–15.5)
WBC: 11.1 10*3/uL — ABNORMAL HIGH (ref 4.0–10.5)
nRBC: 0 % (ref 0.0–0.2)

## 2019-10-15 LAB — GLUCOSE, CAPILLARY
Glucose-Capillary: 160 mg/dL — ABNORMAL HIGH (ref 70–99)
Glucose-Capillary: 164 mg/dL — ABNORMAL HIGH (ref 70–99)
Glucose-Capillary: 195 mg/dL — ABNORMAL HIGH (ref 70–99)
Glucose-Capillary: 147 mg/dL — ABNORMAL HIGH (ref 70–99)

## 2019-10-15 LAB — COMPREHENSIVE METABOLIC PANEL
ALT: 48 U/L — ABNORMAL HIGH (ref 0–44)
AST: 57 U/L — ABNORMAL HIGH (ref 15–41)
Albumin: 4.4 g/dL (ref 3.5–5.0)
Alkaline Phosphatase: 49 U/L (ref 38–126)
Anion gap: 16 — ABNORMAL HIGH (ref 5–15)
BUN: 18 mg/dL (ref 8–23)
CO2: 20 mmol/L — ABNORMAL LOW (ref 22–32)
Calcium: 8.9 mg/dL (ref 8.9–10.3)
Chloride: 102 mmol/L (ref 98–111)
Creatinine, Ser: 2.42 mg/dL — ABNORMAL HIGH (ref 0.61–1.24)
GFR calc Af Amer: 31 mL/min — ABNORMAL LOW (ref >60)
GFR calc non Af Amer: 26 mL/min — ABNORMAL LOW (ref >60)
Glucose, Bld: 237 mg/dL — ABNORMAL HIGH (ref 70–99)
Potassium: 3.4 mmol/L — ABNORMAL LOW (ref 3.5–5.1)
Sodium: 138 mmol/L (ref 135–145)
Total Bilirubin: 1.6 mg/dL — ABNORMAL HIGH (ref 0.3–1.2)
Total Protein: 7 g/dL (ref 6.5–8.1)

## 2019-10-15 LAB — URINALYSIS, ROUTINE W REFLEX MICROSCOPIC
Bilirubin Urine: NEGATIVE
Glucose, UA: NEGATIVE mg/dL
Ketones, ur: NEGATIVE mg/dL
Nitrite: NEGATIVE
Protein, ur: 100 mg/dL — AB
Specific Gravity, Urine: 1.01 (ref 1.005–1.030)
WBC, UA: >50 WBC/hpf — ABNORMAL HIGH (ref 0–5)
pH: 5 (ref 5.0–8.0)

## 2019-10-15 LAB — LACTIC ACID, PLASMA
Lactic Acid, Venous: 4.6 mmol/L (ref 0.5–1.9)
Lactic Acid, Venous: 3.7 mmol/L (ref 0.5–1.9)

## 2019-10-15 LAB — SARS CORONAVIRUS 2 BY RT PCR (HOSPITAL ORDER, PERFORMED IN ~~LOC~~ HOSPITAL LAB): SARS Coronavirus 2: NEGATIVE

## 2019-10-15 MED ORDER — INSULIN ASPART 100 UNIT/ML ~~LOC~~ SOLN
0.0000 [IU] | Freq: Three times a day (TID) | SUBCUTANEOUS | Status: DC
  Administered 2019-10-15 – 2019-10-17 (×7): 3 [IU] via SUBCUTANEOUS
  Administered 2019-10-17: 2 [IU] via SUBCUTANEOUS

## 2019-10-15 MED ORDER — INSULIN ASPART 100 UNIT/ML ~~LOC~~ SOLN: 0.0000 [IU] | Freq: Every day | SUBCUTANEOUS | Status: DC

## 2019-10-15 MED ORDER — LACTATED RINGERS IV BOLUS
1000.0000 mL | Freq: Once | INTRAVENOUS | Status: AC
  Administered 2019-10-15: 1000 mL via INTRAVENOUS

## 2019-10-15 MED ORDER — ATORVASTATIN CALCIUM 40 MG PO TABS
80.0000 mg | ORAL_TABLET | Freq: Every day | ORAL | Status: DC
  Administered 2019-10-15 – 2019-10-17 (×3): 80 mg via ORAL
  Filled 2019-10-15 (×3): qty 2

## 2019-10-15 MED ORDER — POTASSIUM CHLORIDE CRYS ER 20 MEQ PO TBCR
40.0000 meq | EXTENDED_RELEASE_TABLET | Freq: Once | ORAL | Status: AC
  Administered 2019-10-15: 40 meq via ORAL
  Filled 2019-10-15: qty 2

## 2019-10-15 MED ORDER — SODIUM CHLORIDE 0.9 % IV SOLN: Freq: Once | INTRAVENOUS | Status: AC

## 2019-10-15 MED ORDER — ACETAMINOPHEN 325 MG PO TABS
650.0000 mg | ORAL_TABLET | Freq: Four times a day (QID) | ORAL | Status: DC | PRN
  Administered 2019-10-15: 650 mg via ORAL
  Filled 2019-10-15: qty 2

## 2019-10-15 MED ORDER — HEPARIN SODIUM (PORCINE) 5000 UNIT/ML IJ SOLN
5000.0000 [IU] | Freq: Three times a day (TID) | INTRAMUSCULAR | Status: DC
  Administered 2019-10-15 – 2019-10-17 (×7): 5000 [IU] via SUBCUTANEOUS
  Filled 2019-10-15 (×7): qty 1

## 2019-10-15 MED ORDER — PNEUMOCOCCAL VAC POLYVALENT 25 MCG/0.5ML IJ INJ
0.5000 mL | INJECTION | INTRAMUSCULAR | Status: DC
  Filled 2019-10-15 (×2): qty 0.5

## 2019-10-15 MED ORDER — AMIODARONE HCL 200 MG PO TABS
200.0000 mg | ORAL_TABLET | Freq: Every day | ORAL | Status: DC
  Administered 2019-10-15 – 2019-10-17 (×3): 200 mg via ORAL
  Filled 2019-10-15 (×3): qty 1

## 2019-10-15 MED ORDER — SODIUM CHLORIDE 0.9 % IV SOLN: INTRAVENOUS | Status: DC

## 2019-10-15 NOTE — Evaluation (Signed)
Physical Therapy Evaluation Patient Details Name: Tim Duncan MRN: [id number] DOB: [birth year] Today's Date: 10/15/2019   History of Present Illness  Tim Duncan is a 69 y.o. male with medical history significant for atrial fibrillation, hypertension, arthritis and chronic lymphocytic leukemia who presents to the emergency department via EMS due to generalized weakness.  Patient states that he fell off his chair at home and was unable to get up from the floor, so he activated EMS and he was taken to the ED for further evaluation.  Patient was seen in the ED on 7/14 due to a fall and is working with social service to get moved into a long-term care facility.  Patient denies chest pain, shortness of breath, fever, nausea, vomiting or abdominal pain.    Clinical Impression  Patient demonstrates slow labored movement for sitting up at bedside requiring Min assist to move LLE due to c/o increased left knee/calf pain, very unsteady on feet and limited to a few side steps at bedside during transfers to chair and BSC due to weakness and fall risk.  Patient tolerated sitting up in chair after therapy - RN aware.  Patient will benefit from continued physical therapy in hospital and recommended venue below to increase strength, balance, endurance for safe ADLs and gait.     Follow Up Recommendations SNF;Supervision for mobility/OOB;Supervision - Intermittent    Equipment Recommendations  None recommended by PT    Recommendations for Other Services       Precautions / Restrictions Precautions Precautions: Fall Restrictions Weight Bearing Restrictions: No      Mobility  Bed Mobility Overal bed mobility: Needs Assistance Bed Mobility: Supine to Sit     Supine to sit: Min assist     General bed mobility comments: increased time, labored movement  Transfers Overall transfer level: Needs assistance   Transfers: Sit to/from Stand;Stand Pivot Transfers Sit to Stand: Mod assist Stand  pivot transfers: Mod assist       General transfer comment: slow labored movement with fair/poor tolerance for weightbearing on LLE due to knee/calf pain  Ambulation/Gait Ambulation/Gait assistance: Mod assist Gait Distance (Feet): 4 Feet Assistive device: Rolling walker (2 wheeled) Gait Pattern/deviations: Decreased step length - right;Decreased step length - left;Decreased stride length Gait velocity: slow   General Gait Details: limited to 4-5 slow labored unsteady side steps at bedside due to c/o fatigue and left knee/calf pain  Stairs            Wheelchair Mobility    Modified Rankin (Stroke Patients Only)       Balance Overall balance assessment: Needs assistance Sitting-balance support: Feet supported;No upper extremity supported Sitting balance-Leahy Scale: Good Sitting balance - Comments: seated at EOB   Standing balance support: During functional activity;Bilateral upper extremity supported Standing balance-Leahy Scale: Poor Standing balance comment: fair/poor using RW                             Pertinent Vitals/Pain Pain Assessment: 0-10 Pain Score: 7  Pain Location: left knee Pain Descriptors / Indicators: Sore;Grimacing;Guarding Pain Intervention(s): Limited activity within patient's tolerance;Monitored during session;Patient requesting pain meds-RN notified    Home Living Family/patient expects to be discharged to:: Private residence Living Arrangements: Alone Available Help at Discharge: Neighbor;Available PRN/intermittently Type of Home: Mobile home Home Access: Stairs to enter Entrance Stairs-Rails: Right;Left;Can reach both Entrance Stairs-Number of Steps: 2 Home Layout: One level Home Equipment: Clinical cytogeneticist - 2 wheels  Prior Function           Comments: household and short distanced community ambulator, drives     Journalist, newspaper        Extremity/Trunk Assessment   Upper Extremity Assessment Upper  Extremity Assessment: Generalized weakness    Lower Extremity Assessment Lower Extremity Assessment: Generalized weakness;LLE deficits/detail LLE Deficits / Details: grossly 3+/5 LLE: Unable to fully assess due to pain LLE Sensation: WNL LLE Coordination: WNL    Cervical / Trunk Assessment Cervical / Trunk Assessment: Normal  Communication   Communication: No difficulties  Cognition Arousal/Alertness: Awake/alert Behavior During Therapy: WFL for tasks assessed/performed Overall Cognitive Status: Within Functional Limits for tasks assessed                                        General Comments      Exercises     Assessment/Plan    PT Assessment Patient needs continued PT services  PT Problem List Decreased strength;Decreased activity tolerance;Decreased mobility;Decreased balance       PT Treatment Interventions Gait training;Stair training;Functional mobility training;Therapeutic activities;Therapeutic exercise;Patient/family education    PT Goals (Current goals can be found in the Care Plan section)  Acute Rehab PT Goals Patient Stated Goal: return home after rehab PT Goal Formulation: With patient Time For Goal Achievement: 10/29/19 Potential to Achieve Goals: Good    Frequency Min 3X/week   Barriers to discharge        Co-evaluation               AM-PAC PT "6 Clicks" Mobility  Outcome Measure Help needed turning from your back to your side while in a flat bed without using bedrails?: A Lot Help needed moving from lying on your back to sitting on the side of a flat bed without using bedrails?: A Lot Help needed moving to and from a bed to a chair (including a wheelchair)?: A Lot Help needed standing up from a chair using your arms (e.g., wheelchair or bedside chair)?: A Lot Help needed to walk in hospital room?: A Lot Help needed climbing 3-5 steps with a railing? : A Lot 6 Click Score: 12    End of Session   Activity Tolerance:  Patient tolerated treatment well;Patient limited by fatigue Patient left: in chair;with call bell/phone within reach Nurse Communication: Mobility status PT Visit Diagnosis: Unsteadiness on feet (R26.81);Other abnormalities of gait and mobility (R26.89);Muscle weakness (generalized) (M62.81)    Time: 5465-0354 PT Time Calculation (min) (ACUTE ONLY): 29 min   Charges:   PT Evaluation $PT Eval Moderate Complexity: 1 Mod PT Treatments $Therapeutic Activity: 23-37 mins        9:35 AM, 10/15/19 Lonell Grandchild, MPT Physical Therapist with [facility] [phone] office [phone] mobile phone

## 2019-10-15 NOTE — Progress Notes (Signed)
Most recent lactic acid 3.7. previous nurse stated physician aware and lactic will be redrawn.

## 2019-10-15 NOTE — Plan of Care (Signed)

## 2019-10-15 NOTE — NC FL2 (Signed)
[place] LEVEL OF CARE SCREENING TOOL     IDENTIFICATION  Patient Name: Tim Duncan Birthdate: 08/17/1950 Sex: male Admission Date (Current Location): 10/14/2019  [facility] and [place] Number:  [employer] and Address:  [facility] [address], [place]      Provider Number: [id number]  Attending Physician Name and Address:  Barton Dubois, MD  Relative Name and Phone Number:  Pilar Plate [phone]    Current Level of Care: Hospital Recommended Level of Care: [place] Prior Approval Number:    Date Approved/Denied:   PASRR Number: [id number]  Discharge Plan: SNF    Current Diagnoses: Patient Active Problem List   Diagnosis Date Noted  . Hypotension 10/15/2019  . Obesity 10/15/2019  . Arthritis 10/15/2019  . Hypokalemia 10/15/2019  . Hyperglycemia 10/15/2019  . Lactic acidosis 10/15/2019  . DM (diabetes mellitus), type 2 ([place]) 10/15/2019  . Essential hypertension 10/15/2019  . Prolonged QT interval 10/15/2019  . AKI (acute kidney injury) ([place]) 09/14/2019  . Fall 09/14/2019  . Atrial fibrillation ([place]) 09/14/2019  . Varicose veins of lower extremities with complications 76/16/0737  . Chronic venous insufficiency 06/23/2014  . Multiple contusions 06/23/2014  . Synovial cyst 12/15/2013  . CLL (chronic lymphocytic leukemia) ([place])   . Lymphadenopathy   . Lymphocytosis   . Thrombocytosis ([place])   . Anemia 08/26/2012    Orientation RESPIRATION BLADDER Height & Weight     Self, Time, Situation, Place  Normal External catheter Weight: (!) 140.5 kg Height:  6' (182.9 cm)  BEHAVIORAL SYMPTOMS/MOOD NEUROLOGICAL BOWEL NUTRITION STATUS      Continent Diet (see DC summary)  AMBULATORY STATUS COMMUNICATION OF NEEDS Skin   Extensive Assist Verbally Normal                       Personal Care Assistance Level of Assistance  Bathing, Dressing, Feeding Bathing Assistance: Limited  assistance Feeding assistance: Independent Dressing Assistance: Limited assistance     Functional Limitations Info  Sight, Speech, Hearing Sight Info: Adequate Hearing Info: Adequate Speech Info: Adequate    SPECIAL CARE FACTORS FREQUENCY  PT (By licensed PT)     PT Frequency: 5 times a week              Contractures Contractures Info: Not present    Additional Factors Info  Code Status, Allergies Code Status Info: Full Allergies Info: NKDA           Current Medications (10/15/2019):  This is the current hospital active medication list Current Facility-Administered Medications  Medication Dose Route Frequency Provider Last Rate Last Admin  . 0.9 %  sodium chloride infusion   Intravenous Continuous Barton Dubois, MD 75 mL/hr at 10/15/19 0812 Rate Change at 10/15/19 0812  . acetaminophen (TYLENOL) tablet 650 mg  650 mg Oral Q6H PRN Barton Dubois, MD   650 mg at 10/15/19 0810  . amiodarone (PACERONE) tablet 200 mg  200 mg Oral Daily Adefeso, Oladapo, DO   200 mg at 10/15/19 0810  . atorvastatin (LIPITOR) tablet 80 mg  80 mg Oral Daily Adefeso, Oladapo, DO   80 mg at 10/15/19 0811  . heparin injection 5,000 Units  5,000 Units Subcutaneous Q8H Adefeso, Oladapo, DO   5,000 Units at 10/15/19 0557  . insulin aspart (novoLOG) injection 0-15 Units  0-15 Units Subcutaneous TID WC Adefeso, Oladapo, DO   3 Units at 10/15/19 0811  . insulin aspart (novoLOG)  injection 0-5 Units  0-5 Units Subcutaneous QHS Adefeso, Oladapo, DO      . [START ON 10/16/2019] pneumococcal 23 valent vaccine (PNEUMOVAX-23) injection 0.5 mL  0.5 mL Intramuscular Tomorrow-1000 Adefeso, Oladapo, DO         Discharge Medications: Please see discharge summary for a list of discharge medications.  Relevant Imaging Results:  Relevant Lab Results:   Additional Information SS# [id number]  Boneta Lucks, RN

## 2019-10-15 NOTE — Progress Notes (Signed)
Patient seen and examined. Admitted after midnight secondary to generalized weakness, falls and Acute on chronic renal failure. constellation of symptoms and findings suggesting pre-renal azotemia from dehydration and hypotension; multiple agents for BP control prior to admission (now on hold); please refer to H&P written by Dr. Josephine Cables on 10/14/19.  Plan: -Continue IVF's and continue holding antihypertensive agents -Follow renal function and electrolytes. -Follow clinical response -Patient BP is soft but stable, other than feeling weak, he is in not major distress.  Barton Dubois MD [phone]

## 2019-10-15 NOTE — Evaluation (Addendum)
Occupational Therapy Evaluation Patient Details Name: Tim Duncan MRN: [id number] DOB: 11-Apr-1950 Today's Date: 10/15/2019    History of Present Illness 69 y.o. male with medical history significant for atrial fibrillation, hypertension, arthritis and chronic lymphocytic leukemia who presents to the emergency department via EMS due to generalized weakness.  Patient states that he fell off his chair at home and was unable to get up from the floor, so he activated EMS and he was taken to the ED for further evaluation.  Patient was seen in the ED on 7/14 due to a fall and is working with social service to get moved into a long-term care facility.  Patient denies chest pain, shortness of breath, fever, nausea, vomiting or abdominal pain.   Clinical Impression   PTA, pt was living alone and was performing ADLs and IADLs. Pt currently requiring Min A for UB ADLs and Max A for LB ADLs. Pt with decreased activity tolerance due to pain at LLE. Pt would benefit from further acute OT to facilitate safe dc. Recommend dc to SNF for further OT to optimize safety, independence with ADLs, and return to PLOF.     Follow Up Recommendations  SNF;Supervision/Assistance - 24 hour    Equipment Recommendations  Other (comment) (Defer to next venue)    Recommendations for Other Services PT consult     Precautions / Restrictions Precautions Precautions: Fall Restrictions Weight Bearing Restrictions: No      Mobility Bed Mobility            General bed mobility comments: In recliner upon arrival  Transfers             General transfer comment: Declined to perform transfer at this time due to pain    Balance Overall balance assessment: Needs assistance Sitting-balance support: Feet supported;No upper extremity supported Sitting balance-Leahy Scale: Good Sitting balance - Comments: seated at EOB                                 ADL either performed or assessed with  clinical judgement   ADL Overall ADL's : Needs assistance/impaired Eating/Feeding: Set up;Sitting   Grooming: Supervision/safety;Set up;Oral care;Sitting Grooming Details (indicate cue type and reason): Washing face and oral care while seated at sink. Requesting not to perform functional mobility at this time fdue to pain at LLE; pushing recliner to sink. Upper Body Bathing: Minimal assistance;Sitting   Lower Body Bathing: Maximal assistance;Sitting/lateral leans   Upper Body Dressing : Minimal assistance;Sitting   Lower Body Dressing: Maximal assistance;Sitting/lateral leans                 General ADL Comments: Pt recently transfered to recliner with PT. Requesting to stay seated and perform grooming tasks due to leg pain. Demonstrating poor activity tolerance and ROM of LLE     Vision         Perception     Praxis      Pertinent Vitals/Pain Pain Assessment: 0-10 Pain Score: 7  Pain Location: left knee Pain Descriptors / Indicators: Sore;Grimacing;Guarding Pain Intervention(s): Limited activity within patient's tolerance;Monitored during session;Patient requesting pain meds-RN notified     Hand Dominance Right   Extremity/Trunk Assessment Upper Extremity Assessment Upper Extremity Assessment: Generalized weakness   Lower Extremity Assessment Lower Extremity Assessment: Defer to PT evaluation LLE Deficits / Details: grossly 3+/5 LLE: Unable to fully assess due to pain LLE Sensation: WNL LLE Coordination: WNL   Cervical /  Trunk Assessment Cervical / Trunk Assessment: Normal;Other exceptions Cervical / Trunk Exceptions: Increased body habitus   Communication Communication Communication: No difficulties   Cognition Arousal/Alertness: Awake/alert Behavior During Therapy: WFL for tasks assessed/performed Overall Cognitive Status: Within Functional Limits for tasks assessed                                     General Comments        Exercises     Shoulder Instructions      Home Living Family/patient expects to be discharged to:: Private residence Living Arrangements: Alone Available Help at Discharge: Neighbor;Available PRN/intermittently Type of Home: Mobile home Home Access: Stairs to enter Entrance Stairs-Number of Steps: 2 Entrance Stairs-Rails: Right;Left;Can reach both Home Layout: One level     Bathroom Shower/Tub: Teacher, early years/pre: Standard Bathroom Accessibility: Yes   Home Equipment: Clinical cytogeneticist - 2 wheels          Prior Functioning/Environment Level of Independence: Independent with assistive device(s)        Comments: ADLs, IADLs, uses RW for mobility. Doesn't drive. Reports difficulty with taking out the trash (land lord assists) and requires significant time for BADLs.         OT Problem List: Decreased strength;Decreased range of motion;Decreased activity tolerance;Impaired balance (sitting and/or standing);Decreased knowledge of use of DME or AE;Decreased knowledge of precautions;Pain      OT Treatment/Interventions: Self-care/ADL training;Therapeutic exercise;Energy conservation;DME and/or AE instruction;Therapeutic activities;Patient/family education    OT Goals(Current goals can be found in the care plan section) Acute Rehab OT Goals Patient Stated Goal: return home after rehab OT Goal Formulation: With patient Time For Goal Achievement: 10/29/19 Potential to Achieve Goals: Good  OT Frequency: Min 2X/week   Barriers to D/C:            Co-evaluation              AM-PAC OT "6 Clicks" Daily Activity     Outcome Measure Help from another person eating meals?: A Little Help from another person taking care of personal grooming?: A Little Help from another person toileting, which includes using toliet, bedpan, or urinal?: A Lot Help from another person bathing (including washing, rinsing, drying)?: A Lot Help from another person to put on and  taking off regular upper body clothing?: A Little Help from another person to put on and taking off regular lower body clothing?: A Lot 6 Click Score: 15   End of Session Nurse Communication: Mobility status  Activity Tolerance: Patient limited by pain Patient left: in chair;with call bell/phone within reach  OT Visit Diagnosis: Unsteadiness on feet (R26.81);Other abnormalities of gait and mobility (R26.89);Muscle weakness (generalized) (M62.81);Pain Pain - Right/Left: Left Pain - part of body: Leg                Time: 2409-7353 OT Time Calculation (min): 15 min Charges:  OT General Charges $OT Visit: 1 Visit OT Evaluation $OT Eval Moderate Complexity: [place], OTR/L Acute Rehab Pager: [phone] Office: [place] 10/15/2019, 12:11 PM

## 2019-10-15 NOTE — TOC Initial Note (Addendum)
Transition of Care [facility]) - Initial/Assessment Note    Duncan Details  Name: Tim Duncan MRN: [id number] Date of Birth: [birth year]  Transition of Care [facility]) CM/SW Contact:    Tim Lucks, RN Phone Number: 10/15/2019, 11:04 AM  Clinical Narrative:      Duncan admitted with hypotension. Duncan lives at home alone. Active with [facility] for PT/OT/SW. SW- Tim Duncan [phone] called to say it is unsafe for Duncan to return home, tremors, unsteady on his feet, unable to give himself medication. Duncan is agreeable to SNF. TOC called [facility], Duncan was discharged on 10/02/19 using 16 of his days. After day four his co-pay will be $184. TOC called Tim Duncan in [employer], DSS denied medicaid application yesterday. She will contact them again today to review his case. TOC to follow.     Tim Duncan called with an update- Once Duncan is at [facility], facility can call [place] and they will restart his medicaid application.  Tim Duncan at [facility] updated and [facility] will start INS AUTH.   Expected Discharge Plan: Skilled Nursing Facility Barriers to Discharge: Continued Medical Work up, Unsafe home situation, Other (comment) (needs Medicaid number)   Duncan Goals and CMS Choice     Expected Discharge Plan and Services Expected Discharge Plan: [place]         Prior Living Arrangements/Services   Lives with:: Self          Need for Family Participation in Duncan Care: Yes (Comment) Care giver support system in place?: No (comment)   Criminal Activity/Legal Involvement Pertinent to Current Situation/Hospitalization: No - Comment as needed  Activities of Daily Living Home Assistive Devices/Equipment: Environmental consultant (specify type), Shower chair with back ADL Screening (condition at time of admission) Duncan's cognitive ability adequate to safely complete daily activities?: Yes Is the Duncan deaf or have difficulty hearing?: No Does the Duncan have difficulty  seeing, even when wearing glasses/contacts?: No Does the Duncan have difficulty concentrating, remembering, or making decisions?: No Duncan able to express need for assistance with ADLs?: Yes Does the Duncan have difficulty dressing or bathing?: No Independently performs ADLs?: No Communication: Independent Dressing (OT): Independent Grooming: Independent Feeding: Independent Bathing: Independent Toileting: Independent In/Out Bed: Needs assistance Is this a change from baseline?: Pre-admission baseline Walks in Home: Needs assistance Is this a change from baseline?: Pre-admission baseline Does the Duncan have difficulty walking or climbing stairs?: Yes Weakness of Legs: Both Weakness of Arms/Hands: None   Emotional Assessment     Affect (typically observed): Accepting Orientation: : Oriented to Self, Oriented to Place, Oriented to  Time, Oriented to Situation      Admission diagnosis:  Hypokalemia [E87.6] Serum total bilirubin elevated [R17] Acute kidney injury (nontraumatic) (HCC) [N17.9] Elevated transaminase level [R74.01] Normochromic normocytic anemia [D64.9] Hypotension [I95.9] Hypotension, unspecified hypotension type [I95.9] Duncan Active Problem List   Diagnosis Date Noted  . Hypotension 10/15/2019  . Obesity 10/15/2019  . Arthritis 10/15/2019  . Hypokalemia 10/15/2019  . Hyperglycemia 10/15/2019  . Lactic acidosis 10/15/2019  . DM (diabetes mellitus), type 2 ([place]) 10/15/2019  . Essential hypertension 10/15/2019  . Prolonged QT interval 10/15/2019  . AKI (acute kidney injury) ([place]) 09/14/2019  . Fall 09/14/2019  . Atrial fibrillation ([place]) 09/14/2019  . Varicose veins of lower extremities with complications 71/08/2692  . Chronic venous insufficiency 06/23/2014  . Multiple contusions 06/23/2014  . Synovial cyst 12/15/2013  . CLL (chronic lymphocytic leukemia) ([place])   . Lymphadenopathy   . Lymphocytosis   .  Thrombocytosis ([place])   . Anemia 08/26/2012    PCP:  Celene Squibb, MD Pharmacy:   [place], [place] - [address] [place] [address] [place] [address] [place] [address] Phone: [phone] Fax: [phone]  [place], [place], [place] - [place] [address] [place] [address] Phone: [phone] Fax: [place], [place] [address] Phone: [phone] Fax: [phone]  [place], [place] [address] [place], [place] [address] [place], [place] [address] [place] [address] Phone: [phone] Fax: [phone]

## 2019-10-15 NOTE — Care Management Important Message (Signed)
Important Message  Patient Details  Name: Tim Duncan MRN: [id number] Date of Birth: May 06, 1950   Medicare Important Message Given:  Yes     [name] 10/15/2019, 3:50 PM

## 2019-10-15 NOTE — H&P (Addendum)
History and Physical  Tim Duncan RCV:[id number] DOB: [birth year] DOA: 10/14/2019  Referring physician: Delora Fuel MD PCP: Celene Squibb, MD  Patient coming from: Home  Chief Complaint: Generalized weakness  HPI: Tim Duncan is a 69 y.o. male with medical history significant for atrial fibrillation, hypertension, arthritis and chronic lymphocytic leukemia who presents to the emergency department via EMS due to generalized weakness.  Patient states that he fell off his chair at home and was unable to get up from the floor, so he activated EMS and he was taken to the ED for further evaluation.  Patient was seen in the ED on 7/14 due to a fall and is working with social service to get moved into a long-term care facility.  Patient denies chest pain, shortness of breath, fever, nausea, vomiting or abdominal pain.  ED Course:  In the emergency department, it was noted to be hypotensive with a BP of 69/44, but other vital signs on arrival were normal.  Work-up in the ED showed leukocytosis, hypokalemia, hyperglycemia with an anion gap of 16, BUN/creatinine 18/2.42, lactic acid 4.6> 3.7, urinalysis showed large leukocytes and > 50 WBC.  SARS coronavirus 2 was negative.  Chest x-ray showed no acute intrathoracic process.  Patient was provided with 20 L of LR and 1 L of NS.  Hospitalist was asked to admit.  For further evaluation and management.  Review of Systems: Constitutional: Negative for chills and fever.  HENT: Negative for ear pain and sore throat.   Eyes: Negative for pain and visual disturbance.  Respiratory: Negative for cough, chest tightness and shortness of breath.   Cardiovascular: Negative for chest pain and palpitations.  Gastrointestinal: Negative for abdominal pain and vomiting.  Endocrine: Negative for polyphagia and polyuria.  Genitourinary: Negative for decreased urine volume, dysuria, enuresis, hematuria, vaginal discharge and vaginal pain.  Musculoskeletal: Negative for  arthralgias and back pain.  Skin: Negative for color change and rash.  Allergic/Immunologic: Negative for immunocompromised state.  Neurological: Positive for weakness.  Negative for tremors, syncope, speech difficulty, light-headedness and headaches.  Hematological: Does not bruise/bleed easily.  All other systems reviewed and are negative   Past Medical History:  Diagnosis Date  . Anemia 08/26/2012  . BPH (benign prostatic hyperplasia)   . Cancer ([place])    cll  . CLL (chronic lymphocytic leukemia) ([place])   . Hypertension    off meds  . Lymphadenopathy   . Lymphocytosis   . Thrombocytosis ([place])   . Tremors of nervous system   . Wears dentures    top   Past Surgical History:  Procedure Laterality Date  . CATARACT EXTRACTION W/PHACO Right 06/21/2014   Procedure: CATARACT EXTRACTION PHACO AND INTRAOCULAR LENS PLACEMENT; CDE:  11.10;  Surgeon: Rutherford Guys, MD;  Location: [facility];  Service: Ophthalmology;  Laterality: Right;  . CATARACT EXTRACTION W/PHACO Left 07/12/2014   Procedure: CATARACT EXTRACTION PHACO AND INTRAOCULAR LENS PLACEMENT (IOC);  Surgeon: Rutherford Guys, MD;  Location: [facility];  Service: Ophthalmology;  Laterality: Left;  CDE:7.52  . ENDOVENOUS ABLATION SAPHENOUS VEIN W/ LASER Right 12-08-2014   endovenous laser ablation (right greater saphenous vein) by Curt Jews MD   . FRACTURE SURGERY  1990   rt ring finger  . HERNIA REPAIR  1992   umb  . ORIF ANKLE FRACTURE  1995   right  . PORTACATH PLACEMENT  3/14  . SYNOVECTOMY Right 12/15/2013   Procedure: RIGHT RING FINGER FLEXOR SYNOVECTOMY;  Surgeon: Charlotte Crumb, MD;  Location: [facility]  [place];  Service: Orthopedics;  Laterality: Right;  . TONSILLECTOMY      Social History:  reports that he quit smoking about 30 years ago. His smoking use included cigarettes. He has a 40.00 pack-year smoking history. He has never used smokeless tobacco. He reports that he does not drink alcohol and does not use  drugs.   No Known Allergies  Family History  Problem Relation Age of Onset  . Diabetes Mother     Prior to Admission medications   Medication Sig Start Date End Date Taking? Authorizing Provider  allopurinol (ZYLOPRIM) 300 MG tablet Take 300 mg by mouth daily.    [provider]  amiodarone (PACERONE) 200 MG tablet Take 200 mg by mouth daily. 08/19/19   [provider]  amLODipine (NORVASC) 5 MG tablet Take 10 mg by mouth daily.     [provider]  aspirin EC 81 MG EC tablet Take 1 tablet (81 mg total) by mouth daily. Swallow whole. 09/17/19   Rai, Vernelle Emerald, MD  atorvastatin (LIPITOR) 80 MG tablet Take 80 mg by mouth daily. 08/19/19   [provider]  cyanocobalamin (,VITAMIN B-12,) 1000 MCG/ML injection INJECT 1 ML INTO THE MUSCLE MONTHLY UNTIL FURTHER NOTICE FROM [facility] MD 09/02/19   [provider]  HYDROcodone-acetaminophen (NORCO/VICODIN) 5-325 MG tablet Take 1 tablet by mouth every 4 (four) hours as needed for moderate pain. 10/13/19   Evalee Jefferson, PA-C  HYDROcodone-acetaminophen (NORCO/VICODIN) 5-325 MG tablet Take 2 tablets by mouth every 4 (four) hours as needed. 10/13/19   Evalee Jefferson, PA-C  insulin aspart (NOVOLOG) 100 UNIT/ML injection Inject 5 Units into the skin 3 (three) times daily with meals. Patient not taking: Reported on 10/13/2019 09/17/19   Rai, Vernelle Emerald, MD  insulin aspart (NOVOLOG) 100 UNIT/ML injection Inject 0-9 Units into the skin 3 (three) times daily with meals. Sliding scale CBG 70 - 120: 0 units CBG 121 - 150: 1 unit,  CBG 151 - 200: 2 units,  CBG 201 - 250: 3 units,  CBG 251 - 300: 5 units,  CBG 301 - 350: 7 units,  CBG 351 - 400: 9 units   CBG > 400: 9 units and notify your MD Patient not taking: Reported on 10/13/2019 09/17/19   Rai, Ripudeep K, MD  insulin glargine (LANTUS) 100 UNIT/ML injection Inject 0.3 mLs (30 Units total) into the skin daily. 09/17/19   Rai, Ripudeep K, MD  irbesartan (AVAPRO) 300 MG tablet Take 300  mg by mouth daily.    [provider]  metoprolol tartrate (LOPRESSOR) 100 MG tablet Take 100 mg by mouth 2 (two) times daily. 08/19/19   [provider]  Multiple Vitamin (MULTIVITAMIN) tablet Take 1 tablet by mouth daily.    [provider]  polyethylene glycol (MIRALAX / GLYCOLAX) 17 g packet Take 17 g by mouth daily as needed for mild constipation. 09/17/19   Rai, Vernelle Emerald, MD  tamsulosin (FLOMAX) 0.4 MG CAPS capsule Take 0.4 mg by mouth daily after supper.    [provider]  valACYclovir (VALTREX) 1000 MG tablet Take 1,000 mg by mouth daily as needed (outbreak).  07/20/19   [provider]  venetoclax 100 MG TABS Take 200 mGy by mouth daily.  07/26/19   [provider]    Physical Exam: BP (!) 90/51 (BP Location: Right Arm)   Pulse 80   Temp 97.8 F (36.6 C) (Oral)   Resp 20   Ht 6' (1.829 m)  Wt (!) 140.5 kg   SpO2 97%   BMI 42.01 kg/m   . General: 69 y.o. year-old male well developed well nourished in no acute distress.  Alert and oriented x3. Marland Kitchen HEENT: NCAT, EOMI, PERRLA . Neck: Supple, trachea medial . Cardiovascular: Regular rate and rhythm with no rubs or gallops.  No thyromegaly or JVD noted.  No lower extremity edema. 2/4 pulses in all 4 extremities. Marland Kitchen Respiratory: Clear to auscultation with no wheezes or rales. Good inspiratory effort. . Abdomen: Soft nontender nondistended with normal bowel sounds x4 quadrants. . Muskuloskeletal: No cyanosis, clubbing or edema noted bilaterally . Neuro: CN II-XII intact, strength, sensation, reflexes . Skin: No ulcerative lesions noted or rashes . Psychiatry: Judgement and insight appear normal. Mood is appropriate for condition and setting          Labs on Admission:  Basic Metabolic Panel: Recent Labs  Lab 10/13/19 1500 10/14/19 2336  NA 139 138  K 3.4* 3.4*  CL 103 102  CO2 23 20*  GLUCOSE 162* 237*  BUN 11 18  CREATININE 0.95 2.42*  CALCIUM 8.9 8.9   Liver  Function Tests: Recent Labs  Lab 10/14/19 2336  AST 57*  ALT 48*  ALKPHOS 49  BILITOT 1.6*  PROT 7.0  ALBUMIN 4.4   No results for input(s): LIPASE, AMYLASE in the last 168 hours. No results for input(s): AMMONIA in the last 168 hours. CBC: Recent Labs  Lab 10/13/19 1500 10/14/19 2336  WBC 10.1 11.1*  NEUTROABS 3.9 5.2  HGB 12.6* 12.4*  HCT 39.0 39.1  MCV 98.5 98.5  PLT 154 194   Cardiac Enzymes: Recent Labs  Lab 10/13/19 1500  CKTOTAL 32*    BNP (last 3 results) No results for input(s): BNP in the last 8760 hours.  ProBNP (last 3 results) No results for input(s): PROBNP in the last 8760 hours.  CBG: Recent Labs  Lab 10/14/19 2328  GLUCAP 240*    Radiological Exams on Admission: DG Ankle Complete Left  Result Date: 10/13/2019 CLINICAL DATA:  [place], pain EXAM: LEFT KNEE - COMPLETE 4+ VIEW; LEFT ANKLE COMPLETE - 3+ VIEW COMPARISON:  09/09/2019 FINDINGS: Left knee: Frontal, bilateral oblique, lateral views of the left knee demonstrate stable severe 3 compartmental osteoarthritis. No fracture, subluxation, or dislocation. No joint effusion. Left ankle: Frontal, oblique, lateral views demonstrate no fractures. Alignment is anatomic. Mild midfoot osteoarthritis. Inferior calcaneal spur. Soft tissues are normal. IMPRESSION: 1. Severe 3 compartmental osteoarthritis of the left knee. No acute fracture. 2. Unremarkable left ankle. Electronically Signed   By: Randa Ngo M.D.   On: 10/13/2019 15:24   DG Chest Port 1 View  Result Date: 10/14/2019 CLINICAL DATA:  Hypotension, weakness, leukemia, previous tobacco abuse EXAM: PORTABLE CHEST 1 VIEW COMPARISON:  09/14/2019 FINDINGS: 2 frontal views of the chest demonstrate a stable cardiac silhouette. No airspace disease, effusion, or pneumothorax. No acute bony abnormalities. IMPRESSION: 1. No acute intrathoracic process. Electronically Signed   By: Randa Ngo M.D.   On: 10/14/2019 23:59   DG Knee Complete 4 Views  Left  Result Date: 10/13/2019 CLINICAL DATA:  [place], pain EXAM: LEFT KNEE - COMPLETE 4+ VIEW; LEFT ANKLE COMPLETE - 3+ VIEW COMPARISON:  09/09/2019 FINDINGS: Left knee: Frontal, bilateral oblique, lateral views of the left knee demonstrate stable severe 3 compartmental osteoarthritis. No fracture, subluxation, or dislocation. No joint effusion. Left ankle: Frontal, oblique, lateral views demonstrate no fractures. Alignment is anatomic. Mild midfoot osteoarthritis. Inferior calcaneal spur. Soft tissues are normal.  IMPRESSION: 1. Severe 3 compartmental osteoarthritis of the left knee. No acute fracture. 2. Unremarkable left ankle. Electronically Signed   By: Randa Ngo M.D.   On: 10/13/2019 15:24    EKG: I independently viewed the EKG done and my findings are as followed: Sinus rhythm at rate of 81 bpm with prolonged QTc(541ms)   Assessment/Plan Present on Admission: . Hypotension . CLL (chronic lymphocytic leukemia) ([place]) . Fall . Atrial fibrillation ([facility])  Principal Problem:   Hypotension Active Problems:   CLL (chronic lymphocytic leukemia) ([facility])   Fall   Atrial fibrillation ([facility])   Obesity   Arthritis   Hypokalemia   Hyperglycemia   Lactic acidosis   DM (diabetes mellitus), type 2 ([facility])   Essential hypertension   Prolonged QT interval  Hypotension This may be due to iatrogenic effects considering that patient was taking irbesartan 300 mg, Lopressor 100 mg and amlodipine 10 mg per med rec (awaiting updated med rec).  These meds will be held at this time Continue IV hydration Continue to monitor BP and treat accordingly  Generalized weakness Continue fall precaution and neuro checks Continue PT/OT eval and treat  Essential hypertension No BP meds at this time due to soft blood pressure  Hypokalemia K+ 3.4, this will be replenished  Lactic acidosis Lactic acid 4.6> 3.7 Continue IV hydration as per above Continue to trend lactic acid  Hyperglycemia possibly  secondary to type 2 diabetes mellitus Continue insulin sliding scale and hypoglycemia protocol  Hyperlipidemia Continue Lipitor per home regimen when med rec is updated  History of atrial fibrillation EKG showed normal sinus rhythm at rate of 81 bpm Continue home amiodarone Metoprolol will be held at this time due to soft BP  Chronic Lymphocytic Leukemia Stable  Prolonged QTc QTc 568ms Avoid QT prolonging drugs Magnesium level will be checked Repeat EKG in the morning  Worsening creatinine level Creatinine was 2.42 (baseline creatinine 1.10-1.14) Continue IV hydration as indicated above Renally adjust medications, avoid nephrotoxic agents/dehydration/hypotension  Class 3 Obesity (BMI 42.01) Patient was counseled about the cardiovascular and metabolic risk of morbid obesity. Patient was counseled for diet control, exercise regimen and weight loss.    DVT prophylaxis: Subcu heparin  Code Status: Full code  Family Communication: None at bedside  Disposition Plan:  Patient is from:                        home Anticipated DC to:                   SNF  Anticipated DC date:               2-3 days Anticipated DC barriers:          Discharge to SNF due to patient recurrent falls with no help at home for assistance   Consults called: None  Admission status: Observation    Bernadette Hoit MD [facility]  If 7PM-7AM, please contact night-coverage [email or web address]  10/15/2019, 6:33 AM

## 2019-10-15 NOTE — Plan of Care (Signed)
  Problem: Acute Rehab PT Goals(only PT should resolve) Goal: Pt Will Go Supine/Side To Sit Outcome: Progressing Flowsheets (Taken 10/15/2019 0936) Pt will go Supine/Side to Sit:  with min guard assist  with minimal assist Goal: Patient Will Transfer Sit To/From Stand Outcome: Progressing Flowsheets (Taken 10/15/2019 0936) Patient will transfer sit to/from stand: with minimal assist Goal: Pt Will Transfer Bed To Chair/Chair To Bed Outcome: Progressing Flowsheets (Taken 10/15/2019 0936) Pt will Transfer Bed to Chair/Chair to Bed: with min assist Goal: Pt Will Ambulate Outcome: Progressing Flowsheets (Taken 10/15/2019 0936) Pt will Ambulate:  25 feet  with minimal assist  with moderate assist  with rolling walker   9:37 AM, 10/15/19 Lonell Grandchild, MPT Physical Therapist with [facility] [phone] office [phone] mobile phone

## 2019-10-16 DIAGNOSIS — N179 Acute kidney failure, unspecified: Secondary | ICD-10-CM | POA: Diagnosis not present

## 2019-10-16 DIAGNOSIS — I10 Essential (primary) hypertension: Secondary | ICD-10-CM

## 2019-10-16 DIAGNOSIS — Z794 Long term (current) use of insulin: Secondary | ICD-10-CM

## 2019-10-16 DIAGNOSIS — E1169 Type 2 diabetes mellitus with other specified complication: Secondary | ICD-10-CM | POA: Diagnosis not present

## 2019-10-16 DIAGNOSIS — E1165 Type 2 diabetes mellitus with hyperglycemia: Secondary | ICD-10-CM | POA: Diagnosis not present

## 2019-10-16 DIAGNOSIS — W19XXXD Unspecified fall, subsequent encounter: Secondary | ICD-10-CM

## 2019-10-16 DIAGNOSIS — Z20822 Contact with and (suspected) exposure to covid-19: Secondary | ICD-10-CM | POA: Diagnosis not present

## 2019-10-16 DIAGNOSIS — C9110 Chronic lymphocytic leukemia of B-cell type not having achieved remission: Secondary | ICD-10-CM | POA: Diagnosis not present

## 2019-10-16 DIAGNOSIS — I959 Hypotension, unspecified: Secondary | ICD-10-CM | POA: Diagnosis not present

## 2019-10-16 LAB — COMPREHENSIVE METABOLIC PANEL
ALT: 33 U/L (ref 0–44)
AST: 35 U/L (ref 15–41)
Albumin: 3.6 g/dL (ref 3.5–5.0)
Alkaline Phosphatase: 41 U/L (ref 38–126)
Anion gap: 11 (ref 5–15)
BUN: 14 mg/dL (ref 8–23)
CO2: 24 mmol/L (ref 22–32)
Calcium: 8.3 mg/dL — ABNORMAL LOW (ref 8.9–10.3)
Chloride: 104 mmol/L (ref 98–111)
Creatinine, Ser: 1.03 mg/dL (ref 0.61–1.24)
GFR calc Af Amer: >60 mL/min (ref >60)
GFR calc non Af Amer: >60 mL/min (ref >60)
Glucose, Bld: 157 mg/dL — ABNORMAL HIGH (ref 70–99)
Potassium: 3.5 mmol/L (ref 3.5–5.1)
Sodium: 139 mmol/L (ref 135–145)
Total Bilirubin: 0.8 mg/dL (ref 0.3–1.2)
Total Protein: 5.9 g/dL — ABNORMAL LOW (ref 6.5–8.1)

## 2019-10-16 LAB — GLUCOSE, CAPILLARY
Glucose-Capillary: 151 mg/dL — ABNORMAL HIGH (ref 70–99)
Glucose-Capillary: 186 mg/dL — ABNORMAL HIGH (ref 70–99)
Glucose-Capillary: 167 mg/dL — ABNORMAL HIGH (ref 70–99)
Glucose-Capillary: 162 mg/dL — ABNORMAL HIGH (ref 70–99)

## 2019-10-16 LAB — PROTIME-INR
INR: 1 (ref 0.8–1.2)
Prothrombin Time: 12.6 seconds (ref 11.4–15.2)

## 2019-10-16 LAB — CBC
HCT: 35.6 % — ABNORMAL LOW (ref 39.0–52.0)
Hemoglobin: 11.2 g/dL — ABNORMAL LOW (ref 13.0–17.0)
MCH: 31.4 pg (ref 26.0–34.0)
MCHC: 31.5 g/dL (ref 30.0–36.0)
MCV: 99.7 fL (ref 80.0–100.0)
Platelets: 136 10*3/uL — ABNORMAL LOW (ref 150–400)
RBC: 3.57 MIL/uL — ABNORMAL LOW (ref 4.22–5.81)
RDW: 15.6 % — ABNORMAL HIGH (ref 11.5–15.5)
WBC: 6.7 10*3/uL (ref 4.0–10.5)
nRBC: 0 % (ref 0.0–0.2)

## 2019-10-16 LAB — APTT: aPTT: 27 seconds (ref 24–36)

## 2019-10-16 MED ORDER — METOPROLOL TARTRATE 50 MG PO TABS: 75.0000 mg | ORAL_TABLET | Freq: Two times a day (BID) | ORAL | Status: AC

## 2019-10-16 MED ORDER — IRBESARTAN 300 MG PO TABS: 150.0000 mg | ORAL_TABLET | Freq: Every day | ORAL | Status: AC

## 2019-10-16 MED ORDER — ACETAMINOPHEN 325 MG PO TABS: 650.0000 mg | ORAL_TABLET | Freq: Four times a day (QID) | ORAL | Status: AC | PRN

## 2019-10-16 NOTE — Care Management Obs Status (Signed)
[facility] NOTIFICATION   Patient Details  Name: Tim Duncan MRN: [id number] Date of Birth: [birth year]   Medicare Observation Status Notification Given:  Yes    Natasha Bence, LCSW 10/16/2019, 10:23 AM

## 2019-10-16 NOTE — Discharge Summary (Signed)
Physician Discharge Summary  Tim Duncan XAJ:[id number] DOB: [birth year] DOA: 10/14/2019  PCP: Tim Squibb, MD  Admit date: 10/14/2019 Discharge date: 10/16/2019  Time spent: 35 minutes  Recommendations for Outpatient Follow-up:  1. Tim Duncan to follow renal function and electrolytes 2. Repeat CBC to follow WBCs and hemoglobin count 3. Reassess blood pressure and further adjust antihypertensive regimen as needed.   Discharge Diagnoses:  Principal Problem:   Hypotension Active Problems:   CLL (chronic lymphocytic leukemia) (HCC)   Acute kidney injury (nontraumatic) (HCC)   Fall   Atrial fibrillation (HCC)   Obesity   Arthritis   Hypokalemia   Hyperglycemia   Lactic acidosis   DM (diabetes mellitus), type 2 (HCC)   Essential hypertension   Prolonged QT interval   Discharge Condition: Stable and improved.  Discharged to skilled nursing facility for further care and rehabilitation.  Outpatient follow-up with PCP to further adjust antihypertensive regimen as needed.  Follow-up with oncology service as an outpatient.  CODE STATUS: Full code.  Diet recommendation: Heart healthy, modified carbohydrate and low calorie diet.  Filed Weights   10/14/19 2239 10/15/19 0506  Weight: (!) 141.5 kg (!) 140.5 kg    History of present illness:  As per H&P written by Dr. Josephine Cables 10/15/2019  Tim Duncan is a 69 y.o. male with medical history significant for atrial fibrillation, hypertension, arthritis and chronic lymphocytic leukemia who presents to the emergency department via EMS due to generalized weakness.  Patient states that he fell off his chair at home and was unable to get up from the floor, so he activated EMS and he was taken to the ED for further evaluation.  Patient was seen in the ED on 7/14 due to a fall and is working with social service to get moved into a long-term care facility.  Patient denies chest pain, shortness of breath, fever, nausea, vomiting or  abdominal pain.  ED Course:  In the emergency department, it was noted to be hypotensive with a BP of 69/44, but other vital signs on arrival were normal.  Work-up in the ED showed leukocytosis, hypokalemia, hyperglycemia with an anion gap of 16, BUN/creatinine 18/2.42, lactic acid 4.6> 3.7, urinalysis showed large leukocytes and > 50 WBC.  SARS coronavirus 2 was negative.  Chest x-ray showed no acute intrathoracic process.  Patient was provided with 20 L of LR and 1 L of NS.  Hospitalist was asked to admit.  For further evaluation and management.  Hospital Course:  1-hypotension -Appears to be iatrogenic in the setting of multiple antihypertensive agents -Improved after fluid resuscitation -Antihypertensive agents has been adjusted with controlled blood pressure at time of discharge -Continue to follow heart healthy diet. -Medical hydration.  2-generalized weakness -Appreciate evaluation and recommendation by physical therapy/OT -Patient will be transferred to skilled nursing facility for further rehabilitation and conditioning.  3-hypokalemia -potassium repleted and WNL at discharge  4-AKI -Prerenal azotemia in the setting of hypotension and dehydration -Resolved after fluid resuscitation -Creatinine peaked at 2.4; at discharge down to 1.03. -Repeat basic metabolic Duncan follow-up visit to reassess renal function and stability.  5-HLD -continue statins   6-lactic acidosis -In the setting of severe hypotension and dehydration -maintain adequate hydration  7-history of paroxysmal atrial fibrillation -Sinus rhythm currently -Not on anticoagulation in the setting of falls -Continue amiodarone and metoprolol for rate control.  8-chronic lymphocytic leukemia -Stable WBCs  -Continue venetoclax -Continue outpatient follow-up with hematology/oncology service.   9-BPH -continue flomax  10-class  3 obesity  -Body mass index is 42.01 kg/m. -Low calorie diet and portion control  discussed with the patient.   Procedures: See below for x-ray reports.  Consultations:  None  Discharge Exam: Vitals:   10/16/19 1200 10/16/19 1308  BP: (!) 143/64 (!) 128/57  Pulse: 78 79  Resp:  20  Temp: 97.8 F (36.6 C) 98 F (36.7 C)  SpO2: 97% 95%    General: No chest pain, no shortness of breath, no nausea, no vomiting.  Reports feeling weak and deconditioned but in no acute distress.  Patient is afebrile. Cardiovascular: S1 and S2, no rubs, no gallops, no murmurs.  Unable to assess JVD on exam due to body habitus. Respiratory: No requiring oxygen supplementation; good air movement bilaterally.  No wheezing, no crackles. Abdomen: Obese, soft, nontender, positive bowel sounds Extremities: No cyanosis, no clubbing.  Discharge Instructions   Discharge Instructions    Diet - low sodium heart healthy   Complete by: As directed    Discharge instructions   Complete by: As directed    Maintain adequate hydration -Take medications as prescribed -Repeat basic metabolic Duncan to follow lites and renal function Follow vital signs and adjust antihypertensive regimen as needed Outpatient follow-up with oncology service Follow-up with PCP in 2 weeks after discharge from nursing home Physical therapy and rehabilitation as per nursing facility protocol.     Allergies as of 10/16/2019   No Known Allergies     Medication List    STOP taking these medications   amLODipine 5 MG tablet Commonly known as: NORVASC   HYDROcodone-acetaminophen 5-325 MG tablet Commonly known as: NORCO/VICODIN     TAKE these medications   acetaminophen 325 MG tablet Commonly known as: TYLENOL Take 2 tablets (650 mg total) by mouth every 6 (six) hours as needed for fever or headache (pain).   allopurinol 300 MG tablet Commonly known as: ZYLOPRIM Take 300 mg by mouth daily.   amiodarone 200 MG tablet Commonly known as: PACERONE Take 200 mg by mouth daily.   aspirin 81 MG EC  tablet Take 1 tablet (81 mg total) by mouth daily. Swallow whole.   atorvastatin 80 MG tablet Commonly known as: LIPITOR Take 80 mg by mouth daily.   cyanocobalamin 1000 MCG/ML injection Commonly known as: (VITAMIN B-12) INJECT 1 ML INTO THE MUSCLE MONTHLY UNTIL FURTHER NOTICE FROM ELLIS MD   insulin aspart 100 UNIT/ML injection Commonly known as: novoLOG Inject 0-9 Units into the skin 3 (three) times daily with meals. Sliding scale CBG 70 - 120: 0 units CBG 121 - 150: 1 unit,  CBG 151 - 200: 2 units,  CBG 201 - 250: 3 units,  CBG 251 - 300: 5 units,  CBG 301 - 350: 7 units,  CBG 351 - 400: 9 units   CBG > 400: 9 units and notify your MD What changed: Another medication with the same name was removed. Continue taking this medication, and follow the directions you see here.   insulin glargine 100 UNIT/ML injection Commonly known as: LANTUS Inject 0.3 mLs (30 Units total) into the skin daily.   irbesartan 300 MG tablet Commonly known as: AVAPRO Take 0.5 tablets (150 mg total) by mouth daily. What changed: how much to take   metoprolol tartrate 50 MG tablet Commonly known as: LOPRESSOR Take 1.5 tablets (75 mg total) by mouth 2 (two) times daily. What changed:   medication strength  how much to take   multivitamin tablet Take 1 tablet by mouth  daily.   polyethylene glycol 17 g packet Commonly known as: MIRALAX / GLYCOLAX Take 17 g by mouth daily as needed for mild constipation.   tamsulosin 0.4 MG Caps capsule Commonly known as: FLOMAX Take 0.4 mg by mouth daily after supper.   valACYclovir 1000 MG tablet Commonly known as: VALTREX Take 1,000 mg by mouth daily as needed (outbreak).   venetoclax 100 MG Tabs Take 200 mGy by mouth daily.      No Known Allergies  Contact information for follow-up providers    Tim Squibb, MD. Schedule an appointment as soon as possible for a visit in 2 week(s).   Specialty: Internal Medicine Why: after discharge from SNF. Contact  information: [facility] [address] [phone]            Contact information for after-discharge care    [facility] .   Service: Skilled Nursing Contact information: [address] [place] [phone]                   The results of significant diagnostics from this hospitalization (including imaging, microbiology, ancillary and laboratory) are listed below for reference.    Significant Diagnostic Studies: DG Ankle Complete Left  Result Date: 10/13/2019 CLINICAL DATA:  [place], pain EXAM: LEFT KNEE - COMPLETE 4+ VIEW; LEFT ANKLE COMPLETE - 3+ VIEW COMPARISON:  09/09/2019 FINDINGS: Left knee: Frontal, bilateral oblique, lateral views of the left knee demonstrate stable severe 3 compartmental osteoarthritis. No fracture, subluxation, or dislocation. No joint effusion. Left ankle: Frontal, oblique, lateral views demonstrate no fractures. Alignment is anatomic. Mild midfoot osteoarthritis. Inferior calcaneal spur. Soft tissues are normal. IMPRESSION: 1. Severe 3 compartmental osteoarthritis of the left knee. No acute fracture. 2. Unremarkable left ankle. Electronically Signed   By: Randa Ngo M.D.   On: 10/13/2019 15:24   DG Chest Port 1 View  Result Date: 10/14/2019 CLINICAL DATA:  Hypotension, weakness, leukemia, previous tobacco abuse EXAM: PORTABLE CHEST 1 VIEW COMPARISON:  09/14/2019 FINDINGS: 2 frontal views of the chest demonstrate a stable cardiac silhouette. No airspace disease, effusion, or pneumothorax. No acute bony abnormalities. IMPRESSION: 1. No acute intrathoracic process. Electronically Signed   By: Randa Ngo M.D.   On: 10/14/2019 23:59   DG Knee Complete 4 Views Left  Result Date: 10/13/2019 CLINICAL DATA:  [place], pain EXAM: LEFT KNEE - COMPLETE 4+ VIEW; LEFT ANKLE COMPLETE - 3+ VIEW COMPARISON:  09/09/2019 FINDINGS: Left knee: Frontal, bilateral oblique,  lateral views of the left knee demonstrate stable severe 3 compartmental osteoarthritis. No fracture, subluxation, or dislocation. No joint effusion. Left ankle: Frontal, oblique, lateral views demonstrate no fractures. Alignment is anatomic. Mild midfoot osteoarthritis. Inferior calcaneal spur. Soft tissues are normal. IMPRESSION: 1. Severe 3 compartmental osteoarthritis of the left knee. No acute fracture. 2. Unremarkable left ankle. Electronically Signed   By: Randa Ngo M.D.   On: 10/13/2019 15:24    Microbiology: Recent Results (from the past 240 hour(s))  SARS Coronavirus 2 by RT PCR (hospital order, performed in [facility] hospital lab) Nasopharyngeal Nasopharyngeal Swab     Status: None   Collection Time: 10/14/19 11:08 PM   Specimen: Nasopharyngeal Swab  Result Value Ref Range Status   SARS Coronavirus 2 NEGATIVE NEGATIVE Final    Comment: (NOTE) SARS-CoV-2 target nucleic acids are NOT DETECTED.  The SARS-CoV-2 RNA is generally detectable in upper and lower respiratory specimens during the acute phase of  infection. The lowest concentration of SARS-CoV-2 viral copies this assay can detect is 250 copies / mL. A negative result does not preclude SARS-CoV-2 infection and should not be used as the sole basis for treatment or other patient management decisions.  A negative result may occur with improper specimen collection / handling, submission of specimen other than nasopharyngeal swab, presence of viral mutation(s) within the areas targeted by this assay, and inadequate number of viral copies (<250 copies / mL). A negative result must be combined with clinical observations, patient history, and epidemiological information.  Fact Sheet for Patients:   StrictlyIdeas.no  Fact Sheet for Healthcare Providers: BankingDealers.co.za  This test is not yet approved or  cleared by the [place] FDA and has been authorized for detection  and/or diagnosis of SARS-CoV-2 by FDA under an Emergency Use Authorization (EUA).  This EUA will remain in effect (meaning this test can be used) for the duration of the COVID-19 declaration under Section 564(b)(1) of the Act, 21 U.S.C. section 360bbb-3(b)(1), unless the authorization is terminated or revoked sooner.  Performed at [facility], [address] [place], [place] [address]      Labs: Basic Metabolic Duncan: Recent Labs  Lab 10/13/19 1500 10/14/19 2336 10/16/19 0624  NA 139 138 139  K 3.4* 3.4* 3.5  CL 103 102 104  CO2 23 20* 24  GLUCOSE 162* 237* 157*  BUN 11 18 14   CREATININE 0.95 2.42* 1.03  CALCIUM 8.9 8.9 8.3*   Liver Function Tests: Recent Labs  Lab 10/14/19 2336 10/16/19 0624  AST 57* 35  ALT 48* 33  ALKPHOS 49 41  BILITOT 1.6* 0.8  PROT 7.0 5.9*  ALBUMIN 4.4 3.6   CBC: Recent Labs  Lab 10/13/19 1500 10/14/19 2336 10/16/19 0624  WBC 10.1 11.1* 6.7  NEUTROABS 3.9 5.2  --   HGB 12.6* 12.4* 11.2*  HCT 39.0 39.1 35.6*  MCV 98.5 98.5 99.7  PLT 154 194 136*   Cardiac Enzymes: Recent Labs  Lab 10/13/19 1500  CKTOTAL 32*   CBG: Recent Labs  Lab 10/15/19 1141 10/15/19 1626 10/15/19 2108 10/16/19 0750 10/16/19 1139  GLUCAP 164* 195* 147* 151* 186*    Signed:  Barton Dubois MD.  [facility] 10/16/2019, 3:09 PM

## 2019-10-16 NOTE — Plan of Care (Signed)

## 2019-10-17 DIAGNOSIS — Z6841 Body Mass Index (BMI) 40.0 and over, adult: Secondary | ICD-10-CM | POA: Diagnosis not present

## 2019-10-17 DIAGNOSIS — E1169 Type 2 diabetes mellitus with other specified complication: Secondary | ICD-10-CM | POA: Diagnosis not present

## 2019-10-17 DIAGNOSIS — Z7982 Long term (current) use of aspirin: Secondary | ICD-10-CM | POA: Diagnosis not present

## 2019-10-17 DIAGNOSIS — I959 Hypotension, unspecified: Secondary | ICD-10-CM | POA: Diagnosis not present

## 2019-10-17 DIAGNOSIS — M1712 Unilateral primary osteoarthritis, left knee: Secondary | ICD-10-CM | POA: Diagnosis not present

## 2019-10-17 DIAGNOSIS — R5381 Other malaise: Secondary | ICD-10-CM | POA: Diagnosis not present

## 2019-10-17 DIAGNOSIS — Z20822 Contact with and (suspected) exposure to covid-19: Secondary | ICD-10-CM | POA: Diagnosis not present

## 2019-10-17 DIAGNOSIS — E1165 Type 2 diabetes mellitus with hyperglycemia: Secondary | ICD-10-CM | POA: Diagnosis not present

## 2019-10-17 DIAGNOSIS — S86812A Strain of other muscle(s) and tendon(s) at lower leg level, left leg, initial encounter: Secondary | ICD-10-CM | POA: Diagnosis not present

## 2019-10-17 DIAGNOSIS — Z794 Long term (current) use of insulin: Secondary | ICD-10-CM | POA: Diagnosis not present

## 2019-10-17 DIAGNOSIS — R262 Difficulty in walking, not elsewhere classified: Secondary | ICD-10-CM | POA: Diagnosis not present

## 2019-10-17 DIAGNOSIS — W19XXXD Unspecified fall, subsequent encounter: Secondary | ICD-10-CM | POA: Diagnosis not present

## 2019-10-17 DIAGNOSIS — E1122 Type 2 diabetes mellitus with diabetic chronic kidney disease: Secondary | ICD-10-CM | POA: Diagnosis not present

## 2019-10-17 DIAGNOSIS — E876 Hypokalemia: Secondary | ICD-10-CM | POA: Diagnosis not present

## 2019-10-17 DIAGNOSIS — R69 Illness, unspecified: Secondary | ICD-10-CM | POA: Diagnosis not present

## 2019-10-17 DIAGNOSIS — E118 Type 2 diabetes mellitus with unspecified complications: Secondary | ICD-10-CM | POA: Diagnosis not present

## 2019-10-17 DIAGNOSIS — R531 Weakness: Secondary | ICD-10-CM | POA: Diagnosis not present

## 2019-10-17 DIAGNOSIS — G250 Essential tremor: Secondary | ICD-10-CM | POA: Diagnosis not present

## 2019-10-17 DIAGNOSIS — I480 Paroxysmal atrial fibrillation: Secondary | ICD-10-CM | POA: Diagnosis not present

## 2019-10-17 DIAGNOSIS — I4891 Unspecified atrial fibrillation: Secondary | ICD-10-CM | POA: Diagnosis not present

## 2019-10-17 DIAGNOSIS — Z856 Personal history of leukemia: Secondary | ICD-10-CM | POA: Diagnosis not present

## 2019-10-17 DIAGNOSIS — M6281 Muscle weakness (generalized): Secondary | ICD-10-CM | POA: Diagnosis not present

## 2019-10-17 DIAGNOSIS — Z7401 Bed confinement status: Secondary | ICD-10-CM | POA: Diagnosis not present

## 2019-10-17 DIAGNOSIS — C9110 Chronic lymphocytic leukemia of B-cell type not having achieved remission: Secondary | ICD-10-CM | POA: Diagnosis not present

## 2019-10-17 DIAGNOSIS — D519 Vitamin B12 deficiency anemia, unspecified: Secondary | ICD-10-CM | POA: Diagnosis not present

## 2019-10-17 DIAGNOSIS — D649 Anemia, unspecified: Secondary | ICD-10-CM | POA: Diagnosis not present

## 2019-10-17 DIAGNOSIS — I129 Hypertensive chronic kidney disease with stage 1 through stage 4 chronic kidney disease, or unspecified chronic kidney disease: Secondary | ICD-10-CM | POA: Diagnosis not present

## 2019-10-17 DIAGNOSIS — I10 Essential (primary) hypertension: Secondary | ICD-10-CM | POA: Diagnosis not present

## 2019-10-17 DIAGNOSIS — M170 Bilateral primary osteoarthritis of knee: Secondary | ICD-10-CM | POA: Diagnosis not present

## 2019-10-17 DIAGNOSIS — I951 Orthostatic hypotension: Secondary | ICD-10-CM | POA: Diagnosis not present

## 2019-10-17 DIAGNOSIS — G8929 Other chronic pain: Secondary | ICD-10-CM | POA: Diagnosis not present

## 2019-10-17 DIAGNOSIS — N179 Acute kidney failure, unspecified: Secondary | ICD-10-CM | POA: Diagnosis not present

## 2019-10-17 DIAGNOSIS — N182 Chronic kidney disease, stage 2 (mild): Secondary | ICD-10-CM | POA: Diagnosis not present

## 2019-10-17 LAB — GLUCOSE, CAPILLARY
Glucose-Capillary: 147 mg/dL — ABNORMAL HIGH (ref 70–99)
Glucose-Capillary: 190 mg/dL — ABNORMAL HIGH (ref 70–99)

## 2019-10-17 NOTE — TOC Transition Note (Signed)
Transition of Care [facility]) - CM/SW Discharge Note   Patient Details  Name: Tim Duncan MRN: [id number] Date of Birth: 08/30/1950  [facility]) CM/SW Contact:  Natasha Bence, LCSW Phone Number: 10/17/2019, 12:00 PM   Clinical Narrative:    CSW contacted [facility] to notify them of discharge. [facility] agreeable to receive patient. CSW has completed Med transport form and contacted EMS. Nurse to call report. TOC signing off.   Final next level of care: Skilled Nursing Facility Barriers to Discharge: Barriers Resolved   Patient Goals and CMS Choice Patient states their goals for this hospitalization and ongoing recovery are:: Rehab   Choice offered to / list presented to : Patient  Discharge Placement   Existing PASRR number confirmed : 10/17/19          Patient chooses bed at: [facility] Patient to be transferred to facility by: [facility] Name of family member notified: N/A (Friends' List of numbers not in service)    Discharge Plan and Services                                     Social Determinants of Health (SDOH) Interventions     Readmission Risk Interventions No flowsheet data found.

## 2019-10-17 NOTE — Progress Notes (Signed)
Report called to Jacquenette Shone at [place]

## 2019-10-17 NOTE — Progress Notes (Signed)
Patient seen and examined, Chart has been reviewed and discussed with nursing staff. No overnight complaints. VS has remained well controlled and patient is in no distress. Stable to discharge to SNF today. Patient unable to be transfer yesterday due to SNF request. Please refer to discharge summary written on 10/16/19 for further details on anticipated care and medication adjustments.   Barton Dubois MD [phone]

## 2019-10-18 DIAGNOSIS — E876 Hypokalemia: Secondary | ICD-10-CM | POA: Diagnosis not present

## 2019-10-18 DIAGNOSIS — I951 Orthostatic hypotension: Secondary | ICD-10-CM | POA: Diagnosis not present

## 2019-10-18 DIAGNOSIS — I10 Essential (primary) hypertension: Secondary | ICD-10-CM | POA: Diagnosis not present

## 2019-10-18 DIAGNOSIS — D649 Anemia, unspecified: Secondary | ICD-10-CM | POA: Diagnosis not present

## 2019-10-18 DIAGNOSIS — R531 Weakness: Secondary | ICD-10-CM | POA: Diagnosis not present

## 2019-10-18 DIAGNOSIS — W19XXXD Unspecified fall, subsequent encounter: Secondary | ICD-10-CM | POA: Diagnosis not present

## 2019-10-18 DIAGNOSIS — R262 Difficulty in walking, not elsewhere classified: Secondary | ICD-10-CM | POA: Diagnosis not present

## 2019-10-18 DIAGNOSIS — E1169 Type 2 diabetes mellitus with other specified complication: Secondary | ICD-10-CM | POA: Diagnosis not present

## 2019-10-28 DIAGNOSIS — E1169 Type 2 diabetes mellitus with other specified complication: Secondary | ICD-10-CM | POA: Diagnosis not present

## 2019-10-28 DIAGNOSIS — C9110 Chronic lymphocytic leukemia of B-cell type not having achieved remission: Secondary | ICD-10-CM | POA: Diagnosis not present

## 2019-10-28 DIAGNOSIS — G8929 Other chronic pain: Secondary | ICD-10-CM | POA: Diagnosis not present

## 2019-10-29 ENCOUNTER — Other Ambulatory Visit: Payer: Self-pay

## 2019-10-29 ENCOUNTER — Encounter: Payer: Self-pay | Admitting: Orthopedic Surgery

## 2019-10-29 ENCOUNTER — Ambulatory Visit (INDEPENDENT_AMBULATORY_CARE_PROVIDER_SITE_OTHER): Payer: Medicare Other | Admitting: Orthopedic Surgery

## 2019-10-29 VITALS — BP 102/62 | HR 71 | Ht 72.0 in

## 2019-10-29 DIAGNOSIS — Z6841 Body Mass Index (BMI) 40.0 and over, adult: Secondary | ICD-10-CM | POA: Diagnosis not present

## 2019-10-29 DIAGNOSIS — D519 Vitamin B12 deficiency anemia, unspecified: Secondary | ICD-10-CM | POA: Diagnosis not present

## 2019-10-29 DIAGNOSIS — N182 Chronic kidney disease, stage 2 (mild): Secondary | ICD-10-CM | POA: Diagnosis not present

## 2019-10-29 DIAGNOSIS — S86812A Strain of other muscle(s) and tendon(s) at lower leg level, left leg, initial encounter: Secondary | ICD-10-CM | POA: Diagnosis not present

## 2019-10-29 DIAGNOSIS — I129 Hypertensive chronic kidney disease with stage 1 through stage 4 chronic kidney disease, or unspecified chronic kidney disease: Secondary | ICD-10-CM | POA: Diagnosis not present

## 2019-10-29 DIAGNOSIS — E1122 Type 2 diabetes mellitus with diabetic chronic kidney disease: Secondary | ICD-10-CM | POA: Diagnosis not present

## 2019-10-29 NOTE — Progress Notes (Signed)
NEW PROBLEM//OFFICE VISIT  Chief Complaint  Patient presents with  . Knee Pain    left/ gave out/ fall on 10/13/19 has pain since     Tim Duncan was referred from the ER he had a simple collapsing of his left leg fell went to the ER x-rays were taken of his knee he has osteoarthritis  His primary complaint is pain in his left calf he says it feels like a strain     Review of Systems  Constitutional: Negative for chills and fever.  Musculoskeletal: Negative for back pain and joint pain.  Skin: Negative.      Past Medical History:  Diagnosis Date  . Anemia 08/26/2012  . BPH (benign prostatic hyperplasia)   . Cancer ([place])    cll  . CLL (chronic lymphocytic leukemia) ([place])   . Hypertension    off meds  . Lymphadenopathy   . Lymphocytosis   . Thrombocytosis ([place])   . Tremors of nervous system   . Wears dentures    top    Past Surgical History:  Procedure Laterality Date  . CATARACT EXTRACTION W/PHACO Right 06/21/2014   Procedure: CATARACT EXTRACTION PHACO AND INTRAOCULAR LENS PLACEMENT; CDE:  11.10;  Surgeon: Rutherford Guys, MD;  Location: [facility];  Service: Ophthalmology;  Laterality: Right;  . CATARACT EXTRACTION W/PHACO Left 07/12/2014   Procedure: CATARACT EXTRACTION PHACO AND INTRAOCULAR LENS PLACEMENT (IOC);  Surgeon: Rutherford Guys, MD;  Location: [facility];  Service: Ophthalmology;  Laterality: Left;  CDE:7.52  . ENDOVENOUS ABLATION SAPHENOUS VEIN W/ LASER Right 12-08-2014   endovenous laser ablation (right greater saphenous vein) by Curt Jews MD   . FRACTURE SURGERY  1990   rt ring finger  . HERNIA REPAIR  1992   umb  . ORIF ANKLE FRACTURE  1995   right  . PORTACATH PLACEMENT  3/14  . SYNOVECTOMY Right 12/15/2013   Procedure: RIGHT RING FINGER FLEXOR SYNOVECTOMY;  Surgeon: Charlotte Crumb, MD;  Location: [facility] [place];  Service: Orthopedics;  Laterality: Right;  . TONSILLECTOMY      Family History  Problem Relation Age of Onset  . Diabetes Mother     Social History   Tobacco Use  . Smoking status: Former Smoker    Packs/day: 2.00    Years: 20.00    Pack years: 40.00    Types: Cigarettes    Quit date: 12/13/1988    Years since quitting: 30.8  . Smokeless tobacco: Never Used  Substance Use Topics  . Alcohol use: No  . Drug use: No    No Known Allergies  Current Meds  Medication Sig  . acetaminophen (TYLENOL) 325 MG tablet Take 2 tablets (650 mg total) by mouth every 6 (six) hours as needed for fever or headache (pain).  Marland Kitchen allopurinol (ZYLOPRIM) 300 MG tablet Take 300 mg by mouth daily.  Marland Kitchen atorvastatin (LIPITOR) 80 MG tablet Take 80 mg by mouth daily.  . cyanocobalamin (,VITAMIN B-12,) 1000 MCG/ML injection INJECT 1 ML INTO THE MUSCLE MONTHLY UNTIL FURTHER NOTICE FROM ELLIS MD  . insulin aspart (NOVOLOG) 100 UNIT/ML injection Inject 0-9 Units into the skin 3 (three) times daily with meals. Sliding scale CBG 70 - 120: 0 units CBG 121 - 150: 1 unit,  CBG 151 - 200: 2 units,  CBG 201 - 250: 3 units,  CBG 251 - 300: 5 units,  CBG 301 - 350: 7 units,  CBG 351 - 400: 9 units   CBG > 400: 9 units and notify  your MD  . insulin glargine (LANTUS) 100 UNIT/ML injection Inject 0.3 mLs (30 Units total) into the skin daily.  . irbesartan (AVAPRO) 300 MG tablet Take 0.5 tablets (150 mg total) by mouth daily.  . metoprolol tartrate (LOPRESSOR) 50 MG tablet Take 1.5 tablets (75 mg total) by mouth 2 (two) times daily.  . Multiple Vitamin (MULTIVITAMIN) tablet Take 1 tablet by mouth daily.  . polyethylene glycol (MIRALAX / GLYCOLAX) 17 g packet Take 17 g by mouth daily as needed for mild constipation.  . tamsulosin (FLOMAX) 0.4 MG CAPS capsule Take 0.4 mg by mouth daily after supper.  . valACYclovir (VALTREX) 1000 MG tablet Take 1,000 mg by mouth daily as needed (outbreak).   . venetoclax 100 MG TABS Take 200 mGy by mouth daily.     BP (!) 102/62   Pulse 71   Ht 6' (1.829 m)   BMI 42.01 kg/m   Physical Exam Constitutional:       Appearance: Normal appearance. He is obese.  Neurological:     Mental Status: He is alert.  Psychiatric:        Mood and Affect: Mood normal.        Behavior: Behavior normal.     Ortho Exam Left straight leg raise was negative for radicular symptoms  He had a good straight leg raise he bend his knee 95 degrees no weakness was detected nontender knee no effusion his tenderness was in the medial side of his left calf distal neurovascular function was intact   MEDICAL DECISION MAKING  A.  Encounter Diagnoses  Name Primary?  . Body mass index 40.0-44.9, adult ([place]) Yes  . Morbid obesity ([place])   . Strain of calf muscle, left, initial encounter     B. DATA ANALYSED:    IMAGING: Independent interpretation of images: X-rays were negative except for arthritis  Management:  Ace wrap for support on the left calf  Advance gait training as tolerated weightbearing as tolerated with walker    No orders of the defined types were placed in this encounter.     Arther Abbott, MD  10/29/2019 11:55 AM

## 2019-11-01 DIAGNOSIS — Z7984 Long term (current) use of oral hypoglycemic drugs: Secondary | ICD-10-CM | POA: Diagnosis not present

## 2019-11-01 DIAGNOSIS — R197 Diarrhea, unspecified: Secondary | ICD-10-CM | POA: Diagnosis not present

## 2019-11-01 DIAGNOSIS — C9110 Chronic lymphocytic leukemia of B-cell type not having achieved remission: Secondary | ICD-10-CM | POA: Diagnosis not present

## 2019-11-01 DIAGNOSIS — E790 Hyperuricemia without signs of inflammatory arthritis and tophaceous disease: Secondary | ICD-10-CM | POA: Diagnosis not present

## 2019-11-01 DIAGNOSIS — E119 Type 2 diabetes mellitus without complications: Secondary | ICD-10-CM | POA: Diagnosis not present

## 2019-11-01 DIAGNOSIS — Z79899 Other long term (current) drug therapy: Secondary | ICD-10-CM | POA: Diagnosis not present

## 2019-11-01 DIAGNOSIS — M793 Panniculitis, unspecified: Secondary | ICD-10-CM | POA: Diagnosis not present

## 2019-11-06 DIAGNOSIS — E039 Hypothyroidism, unspecified: Secondary | ICD-10-CM | POA: Diagnosis not present

## 2019-11-08 DIAGNOSIS — C9110 Chronic lymphocytic leukemia of B-cell type not having achieved remission: Secondary | ICD-10-CM | POA: Diagnosis not present

## 2019-11-08 DIAGNOSIS — E669 Obesity, unspecified: Secondary | ICD-10-CM | POA: Diagnosis not present

## 2019-11-08 DIAGNOSIS — R262 Difficulty in walking, not elsewhere classified: Secondary | ICD-10-CM | POA: Diagnosis not present

## 2019-11-08 DIAGNOSIS — R531 Weakness: Secondary | ICD-10-CM | POA: Diagnosis not present

## 2019-11-08 DIAGNOSIS — I10 Essential (primary) hypertension: Secondary | ICD-10-CM | POA: Diagnosis not present

## 2019-11-08 DIAGNOSIS — N400 Enlarged prostate without lower urinary tract symptoms: Secondary | ICD-10-CM | POA: Diagnosis not present

## 2019-11-08 DIAGNOSIS — E1169 Type 2 diabetes mellitus with other specified complication: Secondary | ICD-10-CM | POA: Diagnosis not present

## 2019-11-09 DIAGNOSIS — E039 Hypothyroidism, unspecified: Secondary | ICD-10-CM | POA: Diagnosis not present

## 2019-11-22 DIAGNOSIS — M25562 Pain in left knee: Secondary | ICD-10-CM | POA: Diagnosis not present

## 2019-11-22 DIAGNOSIS — R531 Weakness: Secondary | ICD-10-CM | POA: Diagnosis not present

## 2019-11-22 DIAGNOSIS — I10 Essential (primary) hypertension: Secondary | ICD-10-CM | POA: Diagnosis not present

## 2019-11-22 DIAGNOSIS — G8929 Other chronic pain: Secondary | ICD-10-CM | POA: Diagnosis not present

## 2019-11-22 DIAGNOSIS — E1169 Type 2 diabetes mellitus with other specified complication: Secondary | ICD-10-CM | POA: Diagnosis not present

## 2019-11-22 DIAGNOSIS — N400 Enlarged prostate without lower urinary tract symptoms: Secondary | ICD-10-CM | POA: Diagnosis not present

## 2019-11-23 ENCOUNTER — Other Ambulatory Visit: Payer: Self-pay

## 2019-11-23 NOTE — Patient Outreach (Signed)
[place] [facility]) Care Management  11/23/2019  OFFIE WAIDE 1950/09/22 [id number]     Transition of Care Referral  Referral Date: 11/23/2019 Referral Source: [facility] Discharge Report Date of Discharge: 5/68/6168 Facility: [place]: [facility]    Outreach attempt # to patient. No answer. RN CM left HIPAA compliant voicemail message along with contact info.    Plan: RN CM will make outreach attempt to patient within 3-4 business days. RN CM will send unsuccessful outreach letter to patient.   [name], RN,BSN,CCM [place] Management Telephonic Care Management Coordinator Direct Phone: [phone] Toll Free: [phone] Fax: [phone]

## 2019-11-24 ENCOUNTER — Other Ambulatory Visit: Payer: Self-pay

## 2019-11-24 NOTE — Patient Outreach (Signed)
[place] [facility]) Care Management  11/24/2019  TERENCE BART March 31, 1951 [id number]   Transition of Care Referral  Referral Date: 11/23/2019 Referral Source: [facility] Discharge Report Date of Discharge: 0/11/3816 Facility: [place]: [facility]   Outreach attempt #2 to patient. Call went straight to voicemail.    Plan: RN CM will make outreach attempt to patient within 3-4 business days.  [name], RN,BSN,CCM [place] Management Telephonic Care Management Coordinator Direct Phone: [phone] Toll Free: [phone] Fax: [phone]

## 2019-11-25 ENCOUNTER — Other Ambulatory Visit: Payer: Self-pay

## 2019-11-25 NOTE — Patient Outreach (Signed)
[place] [facility]) Care Management  11/25/2019  Tim Duncan [birth year] [id number]   Transition of Care Referral  Referral Date:11/23/2019 Referral Source:[employer] Discharge Report Date of FQEHAZCUN:11/25/886 Facility:[facility] Insurance:[employer] Medicare   Unsuccessful outreach attempt #3 to patient.    Plan: RN CM will make outreach attempt to patient within 3-4 wks if no response from letter mailed to patient.  [name], RN,BSN,CCM [place] Management Telephonic Care Management Coordinator Direct Phone: [phone] Toll Free: [phone] Fax: [phone]

## 2019-12-01 DIAGNOSIS — R531 Weakness: Secondary | ICD-10-CM | POA: Diagnosis not present

## 2019-12-01 DIAGNOSIS — I959 Hypotension, unspecified: Secondary | ICD-10-CM | POA: Diagnosis not present

## 2019-12-13 ENCOUNTER — Telehealth: Payer: Self-pay | Admitting: Orthopedic Surgery

## 2019-12-13 ENCOUNTER — Other Ambulatory Visit: Payer: Self-pay

## 2019-12-13 NOTE — Telephone Encounter (Signed)
Sonyette, wound care nurse from [facility] called Friday afternoon and left a message asking for someone to call her back regarding an ace wrap for this patient.  Would you call her at [phone]?  Thanks

## 2019-12-13 NOTE — Telephone Encounter (Signed)
Left message for her to call back He was put in ace wrap for calf strain 6 weeks ago

## 2019-12-13 NOTE — Telephone Encounter (Signed)
I discussed with her, he is feeling better, I advised ok to d/c Ace wrap for calf strain   To you FYI

## 2019-12-13 NOTE — Patient Outreach (Signed)
[place] [facility]) Care Management  12/13/2019  Tim Duncan 18-Apr-1950 [id number]   Transition of Care Referral  Referral Date:11/23/2019 Referral Source:[employer] Discharge Report Date of UIVHOYWVX:07/26/6699 Facility:[facility] Insurance:[employer] Medicare    Multiple attempts to establish contact with patient without success. No response from letter mailed to patient. Case is being closed at this time.    Plan: RN CM will close case at this time.  [name], RN,BSN,CCM [place] Management Telephonic Care Management Coordinator Direct Phone: [phone] Toll Free: [phone] Fax: [phone]

## 2019-12-23 DIAGNOSIS — E119 Type 2 diabetes mellitus without complications: Secondary | ICD-10-CM | POA: Diagnosis not present

## 2019-12-23 DIAGNOSIS — E876 Hypokalemia: Secondary | ICD-10-CM | POA: Diagnosis not present

## 2019-12-23 DIAGNOSIS — I951 Orthostatic hypotension: Secondary | ICD-10-CM | POA: Diagnosis not present

## 2019-12-23 DIAGNOSIS — E039 Hypothyroidism, unspecified: Secondary | ICD-10-CM | POA: Diagnosis not present

## 2019-12-30 DIAGNOSIS — I10 Essential (primary) hypertension: Secondary | ICD-10-CM | POA: Diagnosis not present

## 2019-12-30 DIAGNOSIS — E1169 Type 2 diabetes mellitus with other specified complication: Secondary | ICD-10-CM | POA: Diagnosis not present

## 2019-12-30 DIAGNOSIS — N400 Enlarged prostate without lower urinary tract symptoms: Secondary | ICD-10-CM | POA: Diagnosis not present

## 2019-12-30 DIAGNOSIS — C9110 Chronic lymphocytic leukemia of B-cell type not having achieved remission: Secondary | ICD-10-CM | POA: Diagnosis not present

## 2020-01-25 DIAGNOSIS — E785 Hyperlipidemia, unspecified: Secondary | ICD-10-CM | POA: Diagnosis not present

## 2020-01-25 DIAGNOSIS — M109 Gout, unspecified: Secondary | ICD-10-CM | POA: Diagnosis not present

## 2020-01-25 DIAGNOSIS — I959 Hypotension, unspecified: Secondary | ICD-10-CM | POA: Diagnosis not present

## 2020-03-02 ENCOUNTER — Other Ambulatory Visit: Payer: Self-pay

## 2020-03-02 ENCOUNTER — Ambulatory Visit (INDEPENDENT_AMBULATORY_CARE_PROVIDER_SITE_OTHER): Payer: Medicare (Managed Care) | Admitting: Podiatry

## 2020-03-02 ENCOUNTER — Encounter: Payer: Self-pay | Admitting: Podiatry

## 2020-03-02 DIAGNOSIS — B351 Tinea unguium: Secondary | ICD-10-CM

## 2020-03-02 DIAGNOSIS — E1140 Type 2 diabetes mellitus with diabetic neuropathy, unspecified: Secondary | ICD-10-CM | POA: Diagnosis not present

## 2020-03-02 DIAGNOSIS — M79674 Pain in right toe(s): Secondary | ICD-10-CM | POA: Diagnosis not present

## 2020-03-02 DIAGNOSIS — E1149 Type 2 diabetes mellitus with other diabetic neurological complication: Secondary | ICD-10-CM | POA: Diagnosis not present

## 2020-03-02 DIAGNOSIS — M79675 Pain in left toe(s): Secondary | ICD-10-CM

## 2020-03-02 NOTE — Progress Notes (Signed)
Subjective:   Patient ID: Tim Duncan, male   DOB: 69 y.o.   MRN: [id number]   HPI Patient states he is having thickened dystrophic nail disease of both feet and states they are sore and make it hard for him to wear shoe gear.  Patient has long-term history of issues and is in a wheelchair and living at a nursing home.  Patient does not smoke and is not active   Review of Systems  All other systems reviewed and are negative.       Objective:  Physical Exam Vitals and nursing note reviewed.  Constitutional:      Appearance: He is well-developed.  Pulmonary:     Effort: Pulmonary effort is normal.  Musculoskeletal:        General: Normal range of motion.  Skin:    General: Skin is warm.  Neurological:     Mental Status: He is alert.     Neurovascular status found to be diminished both sharp dull vibratory diminished with shiny-like skin and irritated tissue.  He is obese which is complicating factor and in a wheelchair and has thickened yellow brittle nailbeds that are irritated for him 1-5 both feet with also lymphedema     Assessment:  Chronic mycotic nail infection 1-5 both feet with at risk diabetes obesity lymphedema     Plan:  H&P reviewed condition and discussed daily inspections of his feet.  Today careful debridement done to all nails taking the corners out to reduce pressure and no iatrogenic bleeding was noted.  Patient will be seen back for routine care as needed

## 2020-04-20 ENCOUNTER — Emergency Department (HOSPITAL_COMMUNITY): Payer: Medicare (Managed Care)

## 2020-04-20 ENCOUNTER — Encounter (HOSPITAL_COMMUNITY): Payer: Self-pay | Admitting: Emergency Medicine

## 2020-04-20 ENCOUNTER — Other Ambulatory Visit: Payer: Self-pay

## 2020-04-20 ENCOUNTER — Inpatient Hospital Stay (HOSPITAL_COMMUNITY)
Admission: EM | Admit: 2020-04-20 | Discharge: 2020-05-30 | DRG: 177 | Disposition: E | Payer: Medicare (Managed Care) | Attending: Internal Medicine | Admitting: Internal Medicine

## 2020-04-20 DIAGNOSIS — E860 Dehydration: Secondary | ICD-10-CM | POA: Diagnosis present

## 2020-04-20 DIAGNOSIS — Z7982 Long term (current) use of aspirin: Secondary | ICD-10-CM

## 2020-04-20 DIAGNOSIS — I480 Paroxysmal atrial fibrillation: Secondary | ICD-10-CM | POA: Diagnosis present

## 2020-04-20 DIAGNOSIS — Z515 Encounter for palliative care: Secondary | ICD-10-CM

## 2020-04-20 DIAGNOSIS — N400 Enlarged prostate without lower urinary tract symptoms: Secondary | ICD-10-CM | POA: Diagnosis present

## 2020-04-20 DIAGNOSIS — Z794 Long term (current) use of insulin: Secondary | ICD-10-CM

## 2020-04-20 DIAGNOSIS — Z79899 Other long term (current) drug therapy: Secondary | ICD-10-CM

## 2020-04-20 DIAGNOSIS — N390 Urinary tract infection, site not specified: Secondary | ICD-10-CM | POA: Diagnosis not present

## 2020-04-20 DIAGNOSIS — J154 Pneumonia due to other streptococci: Secondary | ICD-10-CM | POA: Diagnosis present

## 2020-04-20 DIAGNOSIS — Z66 Do not resuscitate: Secondary | ICD-10-CM | POA: Diagnosis not present

## 2020-04-20 DIAGNOSIS — J9601 Acute respiratory failure with hypoxia: Secondary | ICD-10-CM

## 2020-04-20 DIAGNOSIS — E1165 Type 2 diabetes mellitus with hyperglycemia: Secondary | ICD-10-CM | POA: Diagnosis not present

## 2020-04-20 DIAGNOSIS — R0602 Shortness of breath: Secondary | ICD-10-CM

## 2020-04-20 DIAGNOSIS — C9110 Chronic lymphocytic leukemia of B-cell type not having achieved remission: Secondary | ICD-10-CM | POA: Diagnosis present

## 2020-04-20 DIAGNOSIS — I10 Essential (primary) hypertension: Secondary | ICD-10-CM | POA: Diagnosis present

## 2020-04-20 DIAGNOSIS — R0902 Hypoxemia: Secondary | ICD-10-CM

## 2020-04-20 DIAGNOSIS — Z7189 Other specified counseling: Secondary | ICD-10-CM | POA: Diagnosis not present

## 2020-04-20 DIAGNOSIS — Z9221 Personal history of antineoplastic chemotherapy: Secondary | ICD-10-CM

## 2020-04-20 DIAGNOSIS — B964 Proteus (mirabilis) (morganii) as the cause of diseases classified elsewhere: Secondary | ICD-10-CM | POA: Diagnosis not present

## 2020-04-20 DIAGNOSIS — B9620 Unspecified Escherichia coli [E. coli] as the cause of diseases classified elsewhere: Secondary | ICD-10-CM | POA: Diagnosis not present

## 2020-04-20 DIAGNOSIS — G9349 Other encephalopathy: Secondary | ICD-10-CM | POA: Diagnosis not present

## 2020-04-20 DIAGNOSIS — Z87891 Personal history of nicotine dependence: Secondary | ICD-10-CM

## 2020-04-20 DIAGNOSIS — J1282 Pneumonia due to coronavirus disease 2019: Secondary | ICD-10-CM | POA: Diagnosis present

## 2020-04-20 DIAGNOSIS — R7881 Bacteremia: Secondary | ICD-10-CM | POA: Diagnosis not present

## 2020-04-20 DIAGNOSIS — K219 Gastro-esophageal reflux disease without esophagitis: Secondary | ICD-10-CM | POA: Diagnosis present

## 2020-04-20 DIAGNOSIS — N179 Acute kidney failure, unspecified: Secondary | ICD-10-CM | POA: Diagnosis present

## 2020-04-20 DIAGNOSIS — T17908A Unspecified foreign body in respiratory tract, part unspecified causing other injury, initial encounter: Secondary | ICD-10-CM

## 2020-04-20 DIAGNOSIS — J9621 Acute and chronic respiratory failure with hypoxia: Secondary | ICD-10-CM | POA: Diagnosis present

## 2020-04-20 DIAGNOSIS — B953 Streptococcus pneumoniae as the cause of diseases classified elsewhere: Secondary | ICD-10-CM | POA: Diagnosis not present

## 2020-04-20 DIAGNOSIS — Z6841 Body Mass Index (BMI) 40.0 and over, adult: Secondary | ICD-10-CM | POA: Diagnosis not present

## 2020-04-20 DIAGNOSIS — U071 COVID-19: Principal | ICD-10-CM | POA: Diagnosis present

## 2020-04-20 DIAGNOSIS — E785 Hyperlipidemia, unspecified: Secondary | ICD-10-CM | POA: Diagnosis present

## 2020-04-20 DIAGNOSIS — R7989 Other specified abnormal findings of blood chemistry: Secondary | ICD-10-CM | POA: Diagnosis present

## 2020-04-20 DIAGNOSIS — J9600 Acute respiratory failure, unspecified whether with hypoxia or hypercapnia: Secondary | ICD-10-CM | POA: Diagnosis not present

## 2020-04-20 LAB — CBC WITH DIFFERENTIAL/PLATELET
Basophils Absolute: 0 10*3/uL (ref 0.0–0.1)
Basophils Relative: 0 %
Blasts: 45 %
Eosinophils Absolute: 0 10*3/uL (ref 0.0–0.5)
Eosinophils Relative: 0 %
HCT: 37.1 % — ABNORMAL LOW (ref 39.0–52.0)
Hemoglobin: 11.3 g/dL — ABNORMAL LOW (ref 13.0–17.0)
Lymphocytes Relative: 37 %
Lymphs Abs: 20.4 10*3/uL — ABNORMAL HIGH (ref 0.7–4.0)
MCH: 31.5 pg (ref 26.0–34.0)
MCHC: 30.5 g/dL (ref 30.0–36.0)
MCV: 103.3 fL — ABNORMAL HIGH (ref 80.0–100.0)
Monocytes Absolute: 0.6 10*3/uL (ref 0.1–1.0)
Monocytes Relative: 1 %
Neutro Abs: 9.4 10*3/uL — ABNORMAL HIGH (ref 1.7–7.7)
Neutrophils Relative %: 17 %
Platelets: 156 10*3/uL (ref 150–400)
RBC: 3.59 MIL/uL — ABNORMAL LOW (ref 4.22–5.81)
RDW: 16.1 % — ABNORMAL HIGH (ref 11.5–15.5)
WBC: 55.2 10*3/uL (ref 4.0–10.5)
nRBC: 0 % (ref 0.0–0.2)

## 2020-04-20 LAB — COMPREHENSIVE METABOLIC PANEL
ALT: 36 U/L (ref 0–44)
AST: 56 U/L — ABNORMAL HIGH (ref 15–41)
Albumin: 3.3 g/dL — ABNORMAL LOW (ref 3.5–5.0)
Alkaline Phosphatase: 54 U/L (ref 38–126)
Anion gap: 10 (ref 5–15)
BUN: 25 mg/dL — ABNORMAL HIGH (ref 8–23)
CO2: 22 mmol/L (ref 22–32)
Calcium: 8 mg/dL — ABNORMAL LOW (ref 8.9–10.3)
Chloride: 107 mmol/L (ref 98–111)
Creatinine, Ser: 1.49 mg/dL — ABNORMAL HIGH (ref 0.61–1.24)
GFR, Estimated: 51 mL/min — ABNORMAL LOW (ref >60)
Glucose, Bld: 128 mg/dL — ABNORMAL HIGH (ref 70–99)
Potassium: 3.8 mmol/L (ref 3.5–5.1)
Sodium: 139 mmol/L (ref 135–145)
Total Bilirubin: 0.4 mg/dL (ref 0.3–1.2)
Total Protein: 6.4 g/dL — ABNORMAL LOW (ref 6.5–8.1)

## 2020-04-20 LAB — LACTATE DEHYDROGENASE: LDH: 247 U/L — ABNORMAL HIGH (ref 98–192)

## 2020-04-20 LAB — RESP PANEL BY RT-PCR (FLU A&B, COVID) ARPGX2
Influenza A by PCR: NEGATIVE
Influenza B by PCR: NEGATIVE
SARS Coronavirus 2 by RT PCR: POSITIVE — AB

## 2020-04-20 LAB — FIBRINOGEN: Fibrinogen: 618 mg/dL — ABNORMAL HIGH (ref 210–475)

## 2020-04-20 LAB — TRIGLYCERIDES: Triglycerides: 187 mg/dL — ABNORMAL HIGH (ref <150)

## 2020-04-20 LAB — FERRITIN: Ferritin: 992 ng/mL — ABNORMAL HIGH (ref 24–336)

## 2020-04-20 LAB — LACTIC ACID, PLASMA
Lactic Acid, Venous: 1.1 mmol/L (ref 0.5–1.9)
Lactic Acid, Venous: 0.9 mmol/L (ref 0.5–1.9)

## 2020-04-20 LAB — HEMOGLOBIN A1C
Hgb A1c MFr Bld: 6.7 % — ABNORMAL HIGH (ref 4.8–5.6)
Mean Plasma Glucose: 145.59 mg/dL

## 2020-04-20 LAB — D-DIMER, QUANTITATIVE: D-Dimer, Quant: 0.35 ug/mL-FEU (ref 0.00–0.50)

## 2020-04-20 LAB — PROCALCITONIN: Procalcitonin: <0.1 ng/mL

## 2020-04-20 LAB — CBG MONITORING, ED: Glucose-Capillary: 100 mg/dL — ABNORMAL HIGH (ref 70–99)

## 2020-04-20 LAB — C-REACTIVE PROTEIN: CRP: 5.1 mg/dL — ABNORMAL HIGH (ref <1.0)

## 2020-04-20 MED ORDER — SODIUM CHLORIDE 0.9 % IV SOLN
1.0000 mg/kg | Freq: Two times a day (BID) | INTRAVENOUS | Status: DC
  Filled 2020-04-20 (×2): qty 1.12

## 2020-04-20 MED ORDER — ALLOPURINOL 300 MG PO TABS
300.0000 mg | ORAL_TABLET | Freq: Every day | ORAL | Status: DC
  Administered 2020-04-20 – 2020-05-01 (×12): 300 mg via ORAL
  Filled 2020-04-20 (×16): qty 1

## 2020-04-20 MED ORDER — GUAIFENESIN-DM 100-10 MG/5ML PO SYRP
10.0000 mL | ORAL_SOLUTION | ORAL | Status: DC | PRN
  Administered 2020-04-22 – 2020-04-30 (×9): 10 mL via ORAL
  Filled 2020-04-20 (×12): qty 10

## 2020-04-20 MED ORDER — ACETAMINOPHEN 325 MG PO TABS
650.0000 mg | ORAL_TABLET | Freq: Four times a day (QID) | ORAL | Status: DC | PRN
  Administered 2020-04-24 – 2020-04-25 (×3): 650 mg via ORAL
  Filled 2020-04-20 (×4): qty 2

## 2020-04-20 MED ORDER — TAMSULOSIN HCL 0.4 MG PO CAPS
0.4000 mg | ORAL_CAPSULE | Freq: Every day | ORAL | Status: DC
  Administered 2020-04-21 – 2020-05-02 (×12): 0.4 mg via ORAL
  Filled 2020-04-20 (×16): qty 1

## 2020-04-20 MED ORDER — VALACYCLOVIR HCL 500 MG PO TABS
1000.0000 mg | ORAL_TABLET | Freq: Every day | ORAL | Status: DC | PRN
  Filled 2020-04-20: qty 2

## 2020-04-20 MED ORDER — ENOXAPARIN SODIUM 40 MG/0.4ML ~~LOC~~ SOLN: 40.0000 mg | SUBCUTANEOUS | Status: DC

## 2020-04-20 MED ORDER — SODIUM CHLORIDE 0.9 % IV SOLN
1000.0000 mL | INTRAVENOUS | Status: DC
  Administered 2020-04-20 – 2020-04-21 (×4): 1000 mL via INTRAVENOUS

## 2020-04-20 MED ORDER — INSULIN ASPART 100 UNIT/ML ~~LOC~~ SOLN
0.0000 [IU] | Freq: Every day | SUBCUTANEOUS | Status: DC
  Administered 2020-04-20 – 2020-04-23 (×4): 2 [IU] via SUBCUTANEOUS
  Administered 2020-04-27: 4 [IU] via SUBCUTANEOUS
  Administered 2020-04-28 – 2020-04-29 (×2): 3 [IU] via SUBCUTANEOUS
  Filled 2020-04-20: qty 0.05

## 2020-04-20 MED ORDER — SODIUM CHLORIDE 0.9 % IV SOLN
140.0000 mg | Freq: Two times a day (BID) | INTRAVENOUS | Status: AC
  Administered 2020-04-20 – 2020-04-22 (×5): 140 mg via INTRAVENOUS
  Filled 2020-04-20 (×6): qty 1.12

## 2020-04-20 MED ORDER — SODIUM CHLORIDE 0.9 % IV SOLN: 100.0000 mg | Freq: Every day | INTRAVENOUS | Status: DC

## 2020-04-20 MED ORDER — ADULT MULTIVITAMIN W/MINERALS CH
1.0000 | ORAL_TABLET | Freq: Every day | ORAL | Status: DC
  Administered 2020-04-20 – 2020-05-02 (×13): 1 via ORAL
  Filled 2020-04-20 (×16): qty 1

## 2020-04-20 MED ORDER — ZINC SULFATE 220 (50 ZN) MG PO CAPS
220.0000 mg | ORAL_CAPSULE | Freq: Every day | ORAL | Status: DC
  Administered 2020-04-20 – 2020-05-02 (×13): 220 mg via ORAL
  Filled 2020-04-20 (×16): qty 1

## 2020-04-20 MED ORDER — HYDROCOD POLST-CPM POLST ER 10-8 MG/5ML PO SUER
5.0000 mL | Freq: Two times a day (BID) | ORAL | Status: DC | PRN
  Administered 2020-04-21 – 2020-04-30 (×11): 5 mL via ORAL
  Filled 2020-04-20 (×14): qty 5

## 2020-04-20 MED ORDER — SODIUM CHLORIDE 0.9 % IV SOLN: 200.0000 mg | Freq: Once | INTRAVENOUS | Status: DC

## 2020-04-20 MED ORDER — METOPROLOL TARTRATE 50 MG PO TABS
75.0000 mg | ORAL_TABLET | Freq: Two times a day (BID) | ORAL | Status: DC
  Filled 2020-04-20 (×3): qty 1

## 2020-04-20 MED ORDER — AMIODARONE HCL 200 MG PO TABS
200.0000 mg | ORAL_TABLET | Freq: Every day | ORAL | Status: DC
Start: 2020-04-20 — End: 2020-05-03
  Administered 2020-04-20 – 2020-05-01 (×12): 200 mg via ORAL
  Filled 2020-04-20 (×16): qty 1

## 2020-04-20 MED ORDER — INSULIN DETEMIR 100 UNIT/ML ~~LOC~~ SOLN
0.0750 [IU]/kg | Freq: Two times a day (BID) | SUBCUTANEOUS | Status: DC
  Administered 2020-04-20 – 2020-04-21 (×3): 11 [IU] via SUBCUTANEOUS
  Filled 2020-04-20 (×9): qty 0.11

## 2020-04-20 MED ORDER — SODIUM CHLORIDE 0.9 % IV SOLN
100.0000 mg | INTRAVENOUS | Status: AC
  Administered 2020-04-20 (×2): 100 mg via INTRAVENOUS
  Filled 2020-04-20 (×2): qty 20

## 2020-04-20 MED ORDER — IPRATROPIUM-ALBUTEROL 20-100 MCG/ACT IN AERS
1.0000 | INHALATION_SPRAY | Freq: Four times a day (QID) | RESPIRATORY_TRACT | Status: DC
  Administered 2020-04-20 – 2020-04-24 (×14): 1 via RESPIRATORY_TRACT
  Filled 2020-04-20: qty 4

## 2020-04-20 MED ORDER — POLYMYXIN B-TRIMETHOPRIM 10000-0.1 UNIT/ML-% OP SOLN
1.0000 [drp] | Freq: Four times a day (QID) | OPHTHALMIC | Status: DC
  Administered 2020-04-20 – 2020-05-03 (×49): 1 [drp] via OPHTHALMIC
  Filled 2020-04-20: qty 10

## 2020-04-20 MED ORDER — ENOXAPARIN SODIUM 80 MG/0.8ML ~~LOC~~ SOLN
70.0000 mg | SUBCUTANEOUS | Status: DC
  Administered 2020-04-20 – 2020-05-02 (×13): 70 mg via SUBCUTANEOUS
  Filled 2020-04-20 (×19): qty 0.8

## 2020-04-20 MED ORDER — PREDNISONE 50 MG PO TABS: 50.0000 mg | ORAL_TABLET | Freq: Every day | ORAL | Status: DC

## 2020-04-20 MED ORDER — ASCORBIC ACID 500 MG PO TABS
500.0000 mg | ORAL_TABLET | Freq: Every day | ORAL | Status: DC
  Administered 2020-04-20 – 2020-05-01 (×12): 500 mg via ORAL
  Filled 2020-04-20 (×16): qty 1

## 2020-04-20 MED ORDER — ASPIRIN EC 81 MG PO TBEC
81.0000 mg | DELAYED_RELEASE_TABLET | Freq: Every day | ORAL | Status: DC
  Administered 2020-04-20 – 2020-05-01 (×12): 81 mg via ORAL
  Filled 2020-04-20 (×16): qty 1

## 2020-04-20 MED ORDER — DEXAMETHASONE SODIUM PHOSPHATE 10 MG/ML IJ SOLN
10.0000 mg | Freq: Once | INTRAMUSCULAR | Status: AC
  Administered 2020-04-20: 10 mg via INTRAVENOUS
  Filled 2020-04-20: qty 1

## 2020-04-20 MED ORDER — INSULIN ASPART 100 UNIT/ML ~~LOC~~ SOLN
0.0000 [IU] | Freq: Three times a day (TID) | SUBCUTANEOUS | Status: DC
  Administered 2020-04-20: 3 [IU] via SUBCUTANEOUS
  Administered 2020-04-21 (×2): 5 [IU] via SUBCUTANEOUS
  Administered 2020-04-21: 9 [IU] via SUBCUTANEOUS
  Administered 2020-04-22: 3 [IU] via SUBCUTANEOUS
  Administered 2020-04-22: 2 [IU] via SUBCUTANEOUS
  Administered 2020-04-22: 3 [IU] via SUBCUTANEOUS
  Administered 2020-04-23: 5 [IU] via SUBCUTANEOUS
  Administered 2020-04-23: 2 [IU] via SUBCUTANEOUS
  Administered 2020-04-23: 5 [IU] via SUBCUTANEOUS
  Administered 2020-04-24: 2 [IU] via SUBCUTANEOUS
  Administered 2020-04-24 – 2020-04-25 (×4): 1 [IU] via SUBCUTANEOUS
  Administered 2020-04-25: 2 [IU] via SUBCUTANEOUS
  Administered 2020-04-26 (×2): 1 [IU] via SUBCUTANEOUS
  Administered 2020-04-27: 9 [IU] via SUBCUTANEOUS
  Administered 2020-04-28 (×2): 5 [IU] via SUBCUTANEOUS
  Administered 2020-04-28 – 2020-04-29 (×2): 3 [IU] via SUBCUTANEOUS
  Administered 2020-04-29 (×2): 2 [IU] via SUBCUTANEOUS
  Administered 2020-04-30: 7 [IU] via SUBCUTANEOUS
  Filled 2020-04-20: qty 0.09

## 2020-04-20 MED ORDER — PANTOPRAZOLE SODIUM 40 MG PO TBEC
40.0000 mg | DELAYED_RELEASE_TABLET | Freq: Every day | ORAL | Status: DC
  Administered 2020-04-20 – 2020-04-27 (×8): 40 mg via ORAL
  Filled 2020-04-20 (×12): qty 1

## 2020-04-20 MED ORDER — SODIUM CHLORIDE 0.9 % IV SOLN: INTRAVENOUS | Status: AC

## 2020-04-20 MED ORDER — SODIUM CHLORIDE 0.9 % IV SOLN
100.0000 mg | Freq: Every day | INTRAVENOUS | Status: AC
  Administered 2020-04-21 – 2020-04-24 (×4): 100 mg via INTRAVENOUS
  Filled 2020-04-20 (×3): qty 20
  Filled 2020-04-20: qty 100
  Filled 2020-04-20 (×4): qty 20

## 2020-04-20 MED ORDER — ATORVASTATIN CALCIUM 40 MG PO TABS
80.0000 mg | ORAL_TABLET | Freq: Every day | ORAL | Status: DC
Start: 2020-04-20 — End: 2020-05-03
  Administered 2020-04-20 – 2020-05-01 (×12): 80 mg via ORAL
  Filled 2020-04-20 (×2): qty 2
  Filled 2020-04-20: qty 1
  Filled 2020-04-20 (×2): qty 2
  Filled 2020-04-20: qty 1
  Filled 2020-04-20 (×2): qty 2
  Filled 2020-04-20: qty 1
  Filled 2020-04-20 (×5): qty 2
  Filled 2020-04-20: qty 1
  Filled 2020-04-20: qty 2

## 2020-04-20 NOTE — ED Notes (Signed)
CRITICAL VALUE ALERT  Critical Value:  Positive COVID  Date & Time Notied:  04/21/20 @ 3143  Provider Notified: Dr Gilford Raid  Orders Received/Actions taken: see new orders.

## 2020-04-20 NOTE — Progress Notes (Signed)
2141- CBG -250

## 2020-04-20 NOTE — H&P (Signed)
History and Physical    Tim Duncan:[id number] DOB: 10/01/1950 DOA: 2020/05/05  PCP: Celene Squibb, MD   Patient coming from: SNF  I have personally briefly reviewed patient's old medical records in [place]  Chief Complaint: General malaise, shortness of breath intermittent coughing spells.  HPI: Tim Duncan is a 70 y.o. male with medical history significant of hypertension, BPH, atrial fibrillation (not chronically on anticoagulation), chronic lymphocytic leukemia (actively follow by oncology service at [facility]), hyperlipidemia, morbid obesity and diabetes; who presented to the emergency department secondary to general malaise, shortness of breath and intermittent coughing spells.  Patient symptoms have been present for the last 2 days or so and was tested and found to be positive for COVID.  He is a skilled nursing facility oxygen saturation on room air was around 75%.  Patient reports chronic diarrhea, no nausea or vomiting.  Expressed having chills.  Nonproductive coughing spells and shortness of breath at rest on exertion.  No hemoptysis. Of note, patient expressed to be vaccinated with Wynetta Emery & Wynetta Emery vaccine in the past.   ED Course: Inflammatory markers corroborating acute COVID infection; bilateral infiltrates appreciated on all his lungs characteristic for COVID.  IV steroids and remdesivir's initiated.  [facility] has been contacted to place patient in the hospital for acute respiratory failure with hypoxia in the setting of COVID-pneumonia.  Patient is requiring 4 L nasal cannula supplementation.   Review of Systems: As per HPI otherwise all other systems reviewed and are negative.   Past Medical History:  Diagnosis Date  . Anemia 08/26/2012  . BPH (benign prostatic hyperplasia)   . Cancer ([place])    cll  . CLL (chronic lymphocytic leukemia) ([place])   . Hypertension    off meds  . Lymphadenopathy   . Lymphocytosis   . Thrombocytosis   . Tremors of nervous  system   . Wears dentures    top    Past Surgical History:  Procedure Laterality Date  . CATARACT EXTRACTION W/PHACO Right 06/21/2014   Procedure: CATARACT EXTRACTION PHACO AND INTRAOCULAR LENS PLACEMENT; CDE:  11.10;  Surgeon: Rutherford Guys, MD;  Location: [facility];  Service: Ophthalmology;  Laterality: Right;  . CATARACT EXTRACTION W/PHACO Left 07/12/2014   Procedure: CATARACT EXTRACTION PHACO AND INTRAOCULAR LENS PLACEMENT (IOC);  Surgeon: Rutherford Guys, MD;  Location: [facility];  Service: Ophthalmology;  Laterality: Left;  CDE:7.52  . ENDOVENOUS ABLATION SAPHENOUS VEIN W/ LASER Right 12-08-2014   endovenous laser ablation (right greater saphenous vein) by Curt Jews MD   . FRACTURE SURGERY  1990   rt ring finger  . HERNIA REPAIR  1992   umb  . ORIF ANKLE FRACTURE  1995   right  . PORTACATH PLACEMENT  3/14  . SYNOVECTOMY Right 12/15/2013   Procedure: RIGHT RING FINGER FLEXOR SYNOVECTOMY;  Surgeon: Charlotte Crumb, MD;  Location: [facility];  Service: Orthopedics;  Laterality: Right;  . TONSILLECTOMY      Social History  reports that he quit smoking about 31 years ago. His smoking use included cigarettes. He has a 40.00 pack-year smoking history. He has never used smokeless tobacco. He reports that he does not drink alcohol and does not use drugs.  No Known Allergies  Family History  Problem Relation Age of Onset  . Diabetes Mother     Prior to Admission medications   Medication Sig Start Date End Date Taking? Authorizing Provider  acetaminophen (TYLENOL) 325 MG tablet Take 2 tablets (650 mg  total) by mouth every 6 (six) hours as needed for fever or headache (pain). 10/16/19  Yes Barton Dubois, MD  allopurinol (ZYLOPRIM) 300 MG tablet Take 300 mg by mouth daily.   Yes [provider]  amiodarone (PACERONE) 200 MG tablet Take 200 mg by mouth daily. 08/19/19  Yes [provider]  aspirin EC 81 MG EC tablet Take 1 tablet (81 mg total) by mouth daily.  Swallow whole. 09/17/19  Yes Rai, Ripudeep K, MD  atorvastatin (LIPITOR) 80 MG tablet Take 80 mg by mouth daily. 08/19/19  Yes [provider]  cyanocobalamin (,VITAMIN B-12,) 1000 MCG/ML injection INJECT 1 ML INTO THE MUSCLE MONTHLY UNTIL FURTHER NOTICE FROM ELLIS MD 09/02/19  Yes [provider]  insulin aspart (NOVOLOG) 100 UNIT/ML injection Inject 0-9 Units into the skin 3 (three) times daily with meals. Sliding scale CBG 70 - 120: 0 units CBG 121 - 150: 1 unit,  CBG 151 - 200: 2 units,  CBG 201 - 250: 3 units,  CBG 251 - 300: 5 units,  CBG 301 - 350: 7 units,  CBG 351 - 400: 9 units   CBG > 400: 9 units and notify your MD 09/17/19  Yes Rai, Ripudeep K, MD  insulin glargine (LANTUS) 100 UNIT/ML injection Inject 0.3 mLs (30 Units total) into the skin daily. 09/17/19  Yes Rai, Ripudeep K, MD  irbesartan (AVAPRO) 300 MG tablet Take 0.5 tablets (150 mg total) by mouth daily. 10/16/19  Yes Barton Dubois, MD  Lidocaine (HM LIDOCAINE PATCH) 4 % PTCH Apply 1 patch topically every 12 (twelve) hours. 12 on  12 off   Yes [provider]  metoprolol tartrate (LOPRESSOR) 50 MG tablet Take 1.5 tablets (75 mg total) by mouth 2 (two) times daily. 10/16/19  Yes Barton Dubois, MD  Multiple Vitamin (MULTIVITAMIN) tablet Take 1 tablet by mouth daily.   Yes [provider]  polyethylene glycol (MIRALAX / GLYCOLAX) 17 g packet Take 17 g by mouth daily as needed for mild constipation. 09/17/19  Yes Rai, Ripudeep K, MD  tamsulosin (FLOMAX) 0.4 MG CAPS capsule Take 0.4 mg by mouth daily after supper.   Yes [provider]  trimethoprim-polymyxin b (POLYTRIM) ophthalmic solution Place 1 drop into both eyes every 6 (six) hours.   Yes [provider]  valACYclovir (VALTREX) 1000 MG tablet Take 1,000 mg by mouth daily as needed (outbreak).  07/20/19  Yes [provider]  venetoclax 100 MG TABS Take 200 mGy by mouth daily.  07/26/19  Yes [provider]     Physical Exam: Vitals:   04/06/2020 0656 04/18/2020 0658 04/15/2020 0830 04/19/2020 1030  BP:  (!) 101/45 (!) 107/56 (!) 109/55  Pulse:  61 61 60  Resp:  (!) 22 19 15   Temp:  98.5 F (36.9 C)    SpO2:  97% 94% 94%  Weight: (!) 140.5 kg     Height: 6' (1.829 m)       Constitutional: Calm, afebrile, no chest pain, no nausea vomiting currently.  Patient expressed decreased appetite. Vitals:   04/13/2020 0656 04/17/2020 0658 04/12/2020 0830 04/05/2020 1030  BP:  (!) 101/45 (!) 107/56 (!) 109/55  Pulse:  61 61 60  Resp:  (!) 22 19 15   Temp:  98.5 F (36.9 C)    SpO2:  97% 94% 94%  Weight: (!) 140.5 kg     Height: 6' (1.829 m)      Eyes: PERRL, lids and conjunctivae normal; no icterus. ENMT: Mucous  membranes are moist. Posterior pharynx clear of any exudate or lesions; he normally wears dentures. Neck: normal, supple, no masses, no thyromegaly, no JVD. Respiratory: Fair air movement bilaterally; no wheezing, no crackles.  Positive rhonchi appreciated.  No using accessory muscle. Cardiovascular: Rate controlled, no rubs, no gallops, unable to properly assess JVD with body habitus. Abdomen: Obese, no tenderness, no masses palpated. No hepatosplenomegaly. Bowel sounds positive.  Musculoskeletal: no clubbing / cyanosis. No joint deformity upper and lower extremities. Good ROM, no contractures. Normal muscle tone.  Skin: no petechiae. Neurologic: CN 2-12 grossly intact. Sensation intact, DTR normal. Strength 5/5 in all 4.  Psychiatric: Normal judgment and insight. Alert and oriented x 3. Normal mood.   Labs on Admission: I have personally reviewed following labs and imaging studies  CBC: Recent Labs  Lab 04/17/2020 0802  WBC 55.2*  NEUTROABS 9.4*  HGB 11.3*  HCT 37.1*  MCV 103.3*  PLT 295    Basic Metabolic Panel: Recent Labs  Lab 04/03/2020 0802  NA 139  K 3.8  CL 107  CO2 22  GLUCOSE 128*  BUN 25*  CREATININE 1.49*  CALCIUM 8.0*    GFR: Estimated Creatinine Clearance: 69  mL/min (A) (by C-G formula based on SCr of 1.49 mg/dL (H)).  Liver Function Tests: Recent Labs  Lab 04/22/2020 0802  AST 56*  ALT 36  ALKPHOS 54  BILITOT 0.4  PROT 6.4*  ALBUMIN 3.3*    Urine analysis:    Component Value Date/Time   COLORURINE YELLOW 10/15/2019 0334   APPEARANCEUR CLOUDY (A) 10/15/2019 0334   LABSPEC 1.010 10/15/2019 0334   PHURINE 5.0 10/15/2019 0334   GLUCOSEU NEGATIVE 10/15/2019 0334   HGBUR SMALL (A) 10/15/2019 0334   BILIRUBINUR NEGATIVE 10/15/2019 0334   KETONESUR NEGATIVE 10/15/2019 0334   PROTEINUR 100 (A) 10/15/2019 0334   UROBILINOGEN 0.2 10/08/2012 2152   NITRITE NEGATIVE 10/15/2019 0334   LEUKOCYTESUR LARGE (A) 10/15/2019 0334    Radiological Exams on Admission: DG Chest Port 1 View  Result Date: 04/11/2020 CLINICAL DATA:  COVID. EXAM: PORTABLE CHEST 1 VIEW COMPARISON:  10/14/2019 FINDINGS: Normal heart size and mediastinal contours. Hazy density of the right more than left chest. Interstitial markings are prominent but stable. No effusion or pneumothorax. IMPRESSION: Hazy pneumonia on the right and possibly left. Electronically Signed   By: Monte Fantasia M.D.   On: 04/01/2020 07:38    EKG: Independently reviewed.  Sinus rhythm, no acute ischemic changes.  Normal QT.  Assessment/Plan 1-acute respiratory failure with hypoxia secondary to COVID 19 pneumonia -Will provide oxygen supplementation and wean off as tolerated -Follow inflammatory markers -Continue the use of remdesivir and Solu-Medrol -As needed bronchodilators, proning position, incentive spirometer and flutter valve. -Vitamin C and zinc has been ordered -Follow clinical response and provide supportive care.  2-prior history of atrial fibrillation -Appears to be paroxysmal in nature -Currently rate controlled and with sinus rhythm -Not chronically on anticoagulation -Continue the use of amiodarone and beta-blocker.  3-acute kidney injury -In the setting of  dehydration/prerenal azotemia. -We will check UA -Provide fluid resuscitation -Minimize the use of nephrotoxic agents -Avoid hypotension and the use of contrast.  4-hypertension -Currently stable and well-controlled -Continue current antihypertensive agents.  5-history of BPH -No symptoms of urinary retention at this time -Continue Flomax.  6-hyperlipidemia -Continue statins.  7-type 2 diabetes mellitus -Anticipating elevated CBGs with the use of a steroid -Will check A1c -Continue sliding scale insulin and levemir -Follow blood sugar levels and further adjust hypoglycemic  regimen as required.  8-GERD/GI prophylaxis -Continue PPI.  9-history of chronic lymphocytic leukemia -Actively follow at [facility] by oncology service -Holding chemotherapeutic agent currently -Patient is started on high-dose steroids -Follow WBCs trend.  DVT prophylaxis: Lovenox Code Status:   Full code Family Communication:  No family at bedside. Disposition Plan:   Patient is from:  [place] skilled nursing facility  Anticipated DC to:  Skilled nursing facility  Anticipated DC date:  3 days.  Anticipated DC barriers: Stabilization of his resp status and renal function.   Consults called:  None  Admission status:  Inpatient, telemetry bed, length of stay more than 2 midnights.  Severity of Illness: Moderate illness and high risk for decompensation given history of obesity, diabetes and chronic lymphocytic leukemia.  Patient presenting with acute respiratory failure with hypoxia in the setting of COVID-19 pneumonia.  He is requiring 4 L of oxygen supplementation at this time.  We will continue the use of IV steroids and IV remdesivir.  As needed bronchodilators and wean off oxygen supplementation as tolerated.   Barton Dubois MD [facility]  How to contact the [facility] Attending or Consulting provider [place] or covering provider during after hours [place], for this patient?   1. Check the  care team in [facility] and look for a) attending/consulting [facility] provider listed and b) the [facility] team listed 2. Log into [email or web address] and use [place]'s universal password to access. If you do not have the password, please contact the hospital operator. 3. Locate the [facility] provider you are looking for under [facility] and page to a number that you can be directly reached. 4. If you still have difficulty reaching the provider, please page the [facility] (Director on Call) for the Hospitalists listed on amion for assistance.  04/07/2020, 11:43 AM

## 2020-04-20 NOTE — OR Nursing (Signed)
Pt to endo suite , no complaints of pain, adjusted well to area, vital signs stable, no apparent signs of distress.

## 2020-04-20 NOTE — ED Provider Notes (Signed)
[facility] EMERGENCY DEPARTMENT Provider Note   CSN: [id number] Arrival date & time: 04/09/2020  [id number]     History Chief Complaint  Patient presents with  . Shortness of Breath    Tim Duncan is a 70 y.o. male.  Pt presents to the ED today with low oxygen sats.  Pt said he does not feel significantly sob when still, but feels sob with exertion.  He had a low O2 sat today at [facility] ([id number]).  He was apparently also diagnosed with Covid today.  Pt has had some diarrhea, but that has been going on for months.  Pt does have a hx of CLL and is followed by Dr. Lissa Merlin at [facility].  Pt said he's been vaccinated with only 1 shot for Covid.  He is not sure if it was J&J or not.  His CLL has been well controlled per Dr. Eustace Moore last note in December.  Pt has not been neutropenic and his IgG level from 03/06/20 was 554 which is adequate.            Past Medical History:  Diagnosis Date  . Anemia 08/26/2012  . BPH (benign prostatic hyperplasia)   . Cancer ([place])    cll  . CLL (chronic lymphocytic leukemia) ([place])   . Hypertension    off meds  . Lymphadenopathy   . Lymphocytosis   . Thrombocytosis   . Tremors of nervous system   . Wears dentures    top    Patient Active Problem List   Diagnosis Date Noted  . Hypotension 10/15/2019  . Obesity 10/15/2019  . Arthritis 10/15/2019  . Hypokalemia 10/15/2019  . Hyperglycemia 10/15/2019  . Lactic acidosis 10/15/2019  . DM (diabetes mellitus), type 2 ([place]) 10/15/2019  . Essential hypertension 10/15/2019  . Prolonged QT interval 10/15/2019  . Acute kidney injury (nontraumatic) ([place]) 09/14/2019  . Fall 09/14/2019  . Atrial fibrillation ([place]) 09/14/2019  . B12 deficiency 02/02/2019  . Acute respiratory failure with hypoxia ([place]) 09/11/2016  . Bacteremia 05/14/2016  . Varicose veins of lower extremities with complications [id number]  . Chronic venous insufficiency 06/23/2014  . Multiple contusions 06/23/2014  . Routine adult health  maintenance 02/02/2014  . Hyperuricemia 02/02/2014  . Synovial cyst 12/15/2013  . Impetigo 12/03/2012  . Genital herpes 11/02/2012  . CLL (chronic lymphocytic leukemia) ([place])   . Lymphadenopathy   . Lymphocytosis   . Thrombocytosis   . Anemia 08/26/2012  . Pericardial effusion 07/01/2012  . Disorder of skin or subcutaneous tissue 06/16/2012    Past Surgical History:  Procedure Laterality Date  . CATARACT EXTRACTION W/PHACO Right 06/21/2014   Procedure: CATARACT EXTRACTION PHACO AND INTRAOCULAR LENS PLACEMENT; CDE:  11.10;  Surgeon: Rutherford Guys, MD;  Location: [facility];  Service: Ophthalmology;  Laterality: Right;  . CATARACT EXTRACTION W/PHACO Left 07/12/2014   Procedure: CATARACT EXTRACTION PHACO AND INTRAOCULAR LENS PLACEMENT (IOC);  Surgeon: Rutherford Guys, MD;  Location: [facility];  Service: Ophthalmology;  Laterality: Left;  CDE:7.52  . ENDOVENOUS ABLATION SAPHENOUS VEIN W/ LASER Right 12-08-2014   endovenous laser ablation (right greater saphenous vein) by Curt Jews MD   . FRACTURE SURGERY  1990   rt ring finger  . HERNIA REPAIR  1992   umb  . ORIF ANKLE FRACTURE  1995   right  . PORTACATH PLACEMENT  3/14  . SYNOVECTOMY Right 12/15/2013   Procedure: RIGHT RING FINGER FLEXOR SYNOVECTOMY;  Surgeon: Charlotte Crumb, MD;  Location: [facility];  Service:  Orthopedics;  Laterality: Right;  . TONSILLECTOMY         Family History  Problem Relation Age of Onset  . Diabetes Mother     Social History   Tobacco Use  . Smoking status: Former Smoker    Packs/day: 2.00    Years: 20.00    Pack years: 40.00    Types: Cigarettes    Quit date: 12/13/1988    Years since quitting: 31.3  . Smokeless tobacco: Never Used  Substance Use Topics  . Alcohol use: No  . Drug use: No    Home Medications Prior to Admission medications   Medication Sig Start Date End Date Taking? Authorizing Provider  acetaminophen (TYLENOL) 325 MG tablet Take 2 tablets (650 mg total) by mouth  every 6 (six) hours as needed for fever or headache (pain). 10/16/19  Yes Vassie Loll, MD  allopurinol (ZYLOPRIM) 300 MG tablet Take 300 mg by mouth daily.   Yes [provider]  amiodarone (PACERONE) 200 MG tablet Take 200 mg by mouth daily. 08/19/19  Yes [provider]  aspirin EC 81 MG EC tablet Take 1 tablet (81 mg total) by mouth daily. Swallow whole. 09/17/19  Yes Rai, Ripudeep K, MD  atorvastatin (LIPITOR) 80 MG tablet Take 80 mg by mouth daily. 08/19/19  Yes [provider]  cyanocobalamin (,VITAMIN B-12,) 1000 MCG/ML injection INJECT 1 ML INTO THE MUSCLE MONTHLY UNTIL FURTHER NOTICE FROM ELLIS MD 09/02/19  Yes [provider]  insulin aspart (NOVOLOG) 100 UNIT/ML injection Inject 0-9 Units into the skin 3 (three) times daily with meals. Sliding scale CBG 70 - 120: 0 units CBG 121 - 150: 1 unit,  CBG 151 - 200: 2 units,  CBG 201 - 250: 3 units,  CBG 251 - 300: 5 units,  CBG 301 - 350: 7 units,  CBG 351 - 400: 9 units   CBG > 400: 9 units and notify your MD 09/17/19  Yes Rai, Ripudeep K, MD  insulin glargine (LANTUS) 100 UNIT/ML injection Inject 0.3 mLs (30 Units total) into the skin daily. 09/17/19  Yes Rai, Ripudeep K, MD  irbesartan (AVAPRO) 300 MG tablet Take 0.5 tablets (150 mg total) by mouth daily. 10/16/19  Yes Vassie Loll, MD  Lidocaine (HM LIDOCAINE PATCH) 4 % PTCH Apply 1 patch topically every 12 (twelve) hours. 12 on  12 off   Yes [provider]  metoprolol tartrate (LOPRESSOR) 50 MG tablet Take 1.5 tablets (75 mg total) by mouth 2 (two) times daily. 10/16/19  Yes Vassie Loll, MD  Multiple Vitamin (MULTIVITAMIN) tablet Take 1 tablet by mouth daily.   Yes [provider]  polyethylene glycol (MIRALAX / GLYCOLAX) 17 g packet Take 17 g by mouth daily as needed for mild constipation. 09/17/19  Yes Rai, Ripudeep K, MD  tamsulosin (FLOMAX) 0.4 MG CAPS capsule Take 0.4 mg by mouth daily after supper.   Yes [provider]   trimethoprim-polymyxin b (POLYTRIM) ophthalmic solution Place 1 drop into both eyes every 6 (six) hours.   Yes [provider]  valACYclovir (VALTREX) 1000 MG tablet Take 1,000 mg by mouth daily as needed (outbreak).  07/20/19  Yes [provider]  venetoclax 100 MG TABS Take 200 mGy by mouth daily.  07/26/19  Yes [provider]    Allergies    Patient has no known allergies.  Review of Systems   Review of Systems  Respiratory: Positive for shortness of breath.   Gastrointestinal: Positive for diarrhea.  All other systems reviewed and are negative.   Physical Exam Updated Vital Signs BP (!) 109/55   Pulse 60   Temp 98.5 F (36.9 C)   Resp 15   Ht 6' (1.829 m)   Wt (!) 140.5 kg   SpO2 94%   BMI 42.01 kg/m   Physical Exam Vitals and nursing note reviewed.  Constitutional:      Appearance: He is well-developed. He is obese.  HENT:     Head: Normocephalic and atraumatic.     Mouth/Throat:     Mouth: Mucous membranes are moist.     Pharynx: Oropharynx is clear.  Eyes:     Extraocular Movements: Extraocular movements intact.     Pupils: Pupils are equal, round, and reactive to light.  Cardiovascular:     Rate and Rhythm: Normal rate and regular rhythm.  Pulmonary:     Effort: Pulmonary effort is normal.     Breath sounds: Normal breath sounds.  Abdominal:     General: Bowel sounds are normal.     Palpations: Abdomen is soft.  Musculoskeletal:        General: Normal range of motion.     Cervical back: Normal range of motion and neck supple.  Skin:    General: Skin is warm.     Capillary Refill: Capillary refill takes less than 2 seconds.  Neurological:     General: No focal deficit present.     Mental Status: He is alert and oriented to person, place, and time.  Psychiatric:        Mood and Affect: Mood normal.        Behavior: Behavior normal.     ED Results / Procedures / Treatments   Labs (all labs ordered are listed, but only  abnormal results are displayed) Labs Reviewed  RESP PANEL BY RT-PCR (FLU A&B, COVID) ARPGX2 - Abnormal; Notable for the following components:      Result Value   SARS Coronavirus 2 by RT PCR POSITIVE (*)    All other components within normal limits  CBC WITH DIFFERENTIAL/PLATELET - Abnormal; Notable for the following components:   WBC 55.2 (*)    RBC 3.59 (*)    Hemoglobin 11.3 (*)    HCT 37.1 (*)    MCV 103.3 (*)    RDW 16.1 (*)    Neutro Abs 9.4 (*)    Lymphs Abs 20.4 (*)    All other components within normal limits  COMPREHENSIVE METABOLIC PANEL - Abnormal; Notable for the following components:   Glucose, Bld 128 (*)    BUN 25 (*)    Creatinine, Ser 1.49 (*)    Calcium 8.0 (*)    Total Protein 6.4 (*)    Albumin 3.3 (*)    AST 56 (*)    GFR, Estimated 51 (*)    All other components within normal limits  LACTATE DEHYDROGENASE - Abnormal; Notable for the following components:   LDH 247 (*)    All other components within normal limits  FERRITIN - Abnormal; Notable for the following components:   Ferritin 992 (*)    All other components within normal limits  TRIGLYCERIDES - Abnormal; Notable for the following components:   Triglycerides 187 (*)    All other components within normal limits  FIBRINOGEN - Abnormal; Notable for the following components:   Fibrinogen 618 (*)    All other components within normal limits  C-REACTIVE PROTEIN - Abnormal; Notable for the following components:   CRP  5.1 (*)    All other components within normal limits  CULTURE, BLOOD (ROUTINE X 2)  CULTURE, BLOOD (ROUTINE X 2)  LACTIC ACID, PLASMA  LACTIC ACID, PLASMA  D-DIMER, QUANTITATIVE (NOT AT [facility])  PROCALCITONIN  PATHOLOGIST SMEAR REVIEW    EKG EKG Interpretation  Date/Time:  2020/05/07 08:01:35 EST Ventricular Rate:  63 PR Interval:    QRS Duration: 90 QT Interval:  440 QTC Calculation: 451 R Axis:   -2 Text Interpretation: Low voltage, precordial leads No  significant change since last tracing Confirmed by Isla Pence [phone]) on May 07, 2020 8:15:27 AM   Radiology DG Chest Port 1 View  Result Date: 05-07-2020 CLINICAL DATA:  COVID. EXAM: PORTABLE CHEST 1 VIEW COMPARISON:  10/14/2019 FINDINGS: Normal heart size and mediastinal contours. Hazy density of the right more than left chest. Interstitial markings are prominent but stable. No effusion or pneumothorax. IMPRESSION: Hazy pneumonia on the right and possibly left. Electronically Signed   By: Monte Fantasia M.D.   On: 05/07/20 07:38    Procedures Procedures (including critical care time)  Medications Ordered in ED Medications  0.9 %  sodium chloride infusion (1,000 mLs Intravenous New Bag/Given 05-07-20 0759)  remdesivir 100 mg in sodium chloride 0.9 % 100 mL IVPB (has no administration in time range)  remdesivir 100 mg in sodium chloride 0.9 % 100 mL IVPB (has no administration in time range)  dexamethasone (DECADRON) injection 10 mg (has no administration in time range)    ED Course  I have reviewed the triage vital signs and the nursing notes.  Pertinent labs & imaging results that were available during my care of the patient were reviewed by me and considered in my medical decision making (see chart for details).    MDM Rules/Calculators/A&P                          Pt continued on 4L oxygen via [place].  O2 sats in the mid-90s with 4L.  Pt does have covid pneumonia.  He is treated with remdesivir and decadron.  WBC is elevated.  Hx of CLL, but last WBC 6.2 in July.  Pt d/w Dr. Dyann Kief (triad) for admission.  Tim Duncan was evaluated in Emergency Department on 05-07-20 for the symptoms described in the history of present illness. He was evaluated in the context of the global COVID-19 pandemic, which necessitated consideration that the patient might be at risk for infection with the SARS-CoV-2 virus that causes COVID-19. Institutional protocols and algorithms that pertain to  the evaluation of patients at risk for COVID-19 are in a state of rapid change based on information released by regulatory bodies including the CDC and federal and state organizations. These policies and algorithms were followed during the patient's care in the ED.  CRITICAL CARE Performed by: Isla Pence   Total critical care time: 30 minutes  Critical care time was exclusive of separately billable procedures and treating other patients.  Critical care was necessary to treat or prevent imminent or life-threatening deterioration.  Critical care was time spent personally by me on the following activities: development of treatment plan with patient and/or surrogate as well as nursing, discussions with consultants, evaluation of patient's response to treatment, examination of patient, obtaining history from patient or surrogate, ordering and performing treatments and interventions, ordering and review of laboratory studies, ordering and review of radiographic studies, pulse oximetry and re-evaluation of patient's condition.  Final Clinical Impression(s) / ED  Diagnoses Final diagnoses:  Pneumonia due to COVID-19 virus  Acute respiratory failure with hypoxia ([place])  CLL (chronic lymphocytic leukemia) ([place])    Rx / DC Orders ED Discharge Orders    None       Isla Pence, MD 04/30/2020 1129

## 2020-04-20 NOTE — ED Triage Notes (Signed)
[facility] - pt's O2 was 75% on RA. Pt covid + on 04/17/2020. Pt's O2 96 on 4LPM

## 2020-04-21 ENCOUNTER — Encounter (HOSPITAL_COMMUNITY): Payer: Self-pay | Admitting: Internal Medicine

## 2020-04-21 ENCOUNTER — Other Ambulatory Visit: Payer: Self-pay

## 2020-04-21 DIAGNOSIS — J9601 Acute respiratory failure with hypoxia: Secondary | ICD-10-CM | POA: Diagnosis not present

## 2020-04-21 DIAGNOSIS — C9110 Chronic lymphocytic leukemia of B-cell type not having achieved remission: Secondary | ICD-10-CM | POA: Diagnosis not present

## 2020-04-21 DIAGNOSIS — U071 COVID-19: Secondary | ICD-10-CM | POA: Diagnosis not present

## 2020-04-21 LAB — COMPREHENSIVE METABOLIC PANEL
ALT: 35 U/L (ref 0–44)
AST: 41 U/L (ref 15–41)
Albumin: 2.9 g/dL — ABNORMAL LOW (ref 3.5–5.0)
Alkaline Phosphatase: 50 U/L (ref 38–126)
Anion gap: 9 (ref 5–15)
BUN: 24 mg/dL — ABNORMAL HIGH (ref 8–23)
CO2: 21 mmol/L — ABNORMAL LOW (ref 22–32)
Calcium: 7.5 mg/dL — ABNORMAL LOW (ref 8.9–10.3)
Chloride: 109 mmol/L (ref 98–111)
Creatinine, Ser: 1.1 mg/dL (ref 0.61–1.24)
GFR, Estimated: >60 mL/min (ref >60)
Glucose, Bld: 253 mg/dL — ABNORMAL HIGH (ref 70–99)
Potassium: 4.3 mmol/L (ref 3.5–5.1)
Sodium: 139 mmol/L (ref 135–145)
Total Bilirubin: 0.2 mg/dL — ABNORMAL LOW (ref 0.3–1.2)
Total Protein: 5.9 g/dL — ABNORMAL LOW (ref 6.5–8.1)

## 2020-04-21 LAB — CBC WITH DIFFERENTIAL/PLATELET
Basophils Absolute: 0 10*3/uL (ref 0.0–0.1)
Basophils Relative: 0 %
Blasts: 23 %
Eosinophils Absolute: 0 10*3/uL (ref 0.0–0.5)
Eosinophils Relative: 0 %
HCT: 32.9 % — ABNORMAL LOW (ref 39.0–52.0)
Hemoglobin: 10.7 g/dL — ABNORMAL LOW (ref 13.0–17.0)
Lymphocytes Relative: 71 %
Lymphs Abs: 39 10*3/uL — ABNORMAL HIGH (ref 0.7–4.0)
MCH: 32.9 pg (ref 26.0–34.0)
MCHC: 32.5 g/dL (ref 30.0–36.0)
MCV: 101.2 fL — ABNORMAL HIGH (ref 80.0–100.0)
Monocytes Absolute: 0 10*3/uL — ABNORMAL LOW (ref 0.1–1.0)
Monocytes Relative: 0 %
Neutro Abs: 3.3 10*3/uL (ref 1.7–7.7)
Neutrophils Relative %: 6 %
Platelets: 157 10*3/uL (ref 150–400)
RBC: 3.25 MIL/uL — ABNORMAL LOW (ref 4.22–5.81)
RDW: 15.9 % — ABNORMAL HIGH (ref 11.5–15.5)
WBC: 54.9 10*3/uL (ref 4.0–10.5)
nRBC: 0 % (ref 0.0–0.2)

## 2020-04-21 LAB — C-REACTIVE PROTEIN: CRP: 3.6 mg/dL — ABNORMAL HIGH (ref <1.0)

## 2020-04-21 LAB — GLUCOSE, CAPILLARY
Glucose-Capillary: 208 mg/dL — ABNORMAL HIGH (ref 70–99)
Glucose-Capillary: 250 mg/dL — ABNORMAL HIGH (ref 70–99)
Glucose-Capillary: 242 mg/dL — ABNORMAL HIGH (ref 70–99)
Glucose-Capillary: 290 mg/dL — ABNORMAL HIGH (ref 70–99)
Glucose-Capillary: 422 mg/dL — ABNORMAL HIGH (ref 70–99)
Glucose-Capillary: 230 mg/dL — ABNORMAL HIGH (ref 70–99)

## 2020-04-21 LAB — MAGNESIUM: Magnesium: 2.3 mg/dL (ref 1.7–2.4)

## 2020-04-21 LAB — PATHOLOGIST SMEAR REVIEW

## 2020-04-21 LAB — PHOSPHORUS: Phosphorus: 3.7 mg/dL (ref 2.5–4.6)

## 2020-04-21 LAB — FERRITIN: Ferritin: 1075 ng/mL — ABNORMAL HIGH (ref 24–336)

## 2020-04-21 LAB — D-DIMER, QUANTITATIVE: D-Dimer, Quant: 0.35 ug{FEU}/mL (ref 0.00–0.50)

## 2020-04-21 MED ORDER — INSULIN DETEMIR 100 UNIT/ML ~~LOC~~ SOLN
0.1200 [IU]/kg | Freq: Two times a day (BID) | SUBCUTANEOUS | Status: DC
  Administered 2020-04-21 – 2020-04-26 (×11): 17 [IU] via SUBCUTANEOUS
  Filled 2020-04-21 (×14): qty 0.17

## 2020-04-21 MED ORDER — METOPROLOL TARTRATE 50 MG PO TABS
50.0000 mg | ORAL_TABLET | Freq: Two times a day (BID) | ORAL | Status: DC
  Administered 2020-04-21 (×2): 50 mg via ORAL
  Filled 2020-04-21 (×9): qty 1

## 2020-04-21 NOTE — Progress Notes (Signed)
Inpatient Diabetes Program Recommendations  AACE/ADA: New Consensus Statement on Inpatient Glycemic Control   Target Ranges:  Prepandial:   less than 140 mg/dL      Peak postprandial:   less than 180 mg/dL (1-2 hours)      Critically ill patients:  140 - 180 mg/dL   Results for MATISSE, SALAIS (MRN [id number]) as of 04/21/2020 14:24  Ref. Range 10/17/2019 07:17 10/17/2019 11:20 04/19/2020 12:23 04/10/2020 17:58 04/23/2020 21:41 04/21/2020 08:19 04/21/2020 12:44  Glucose-Capillary Latest Ref Range: 70 - 99 mg/dL 147 (H) 190 (H) 100 (H) 208 (H) 250 (H) 242 (H) 290 (H)   Review of Glycemic Control  Diabetes history: DM2 Outpatient Diabetes medications: Lantus 30 units daily, Novolog 0-9 units TID Current orders for Inpatient glycemic control: Levemir 11 units BID, Novolog 0-9 units TID with meals, Novolog 0-5 units QHS; Solumedrol 140 mg Q12H  Inpatient Diabetes Program Recommendations:    Insulin: If steroids are continued, please consider increasing Levemir to 15 units BID and ordering Novolog 4 units TID with meals for meal coverage if patient eats at least 50% of meals.  Thanks, Barnie Alderman, RN, MSN, CDE Diabetes Coordinator Inpatient Diabetes Program [phone] (Team Pager from 8am to 5pm)

## 2020-04-21 NOTE — Plan of Care (Signed)

## 2020-04-21 NOTE — Progress Notes (Addendum)
PROGRESS NOTE    Tim Duncan  ZOX:[id number] DOB: [birth year] DOA: 04/12/2020 PCP: Celene Squibb, MD   Chief Complaint  Patient presents with  . Shortness of Breath    Brief Narrative:  Tim Duncan is a 70 y.o. male with medical history significant of hypertension, BPH, atrial fibrillation (not chronically on anticoagulation), chronic lymphocytic leukemia (actively follow by oncology service at [facility]), hyperlipidemia, morbid obesity and diabetes; who presented to the emergency department secondary to general malaise, shortness of breath and intermittent coughing spells.  Patient symptoms have been present for the last 2 days or so and was tested and found to be positive for COVID.  He is a skilled nursing facility oxygen saturation on room air was around 75%.  Patient reports chronic diarrhea, no nausea or vomiting.  Expressed having chills.  Nonproductive coughing spells and shortness of breath at rest on exertion.  No hemoptysis. Of note, patient expressed to be vaccinated with Wynetta Emery & Wynetta Emery vaccine in the past.   ED Course: Inflammatory markers corroborating acute COVID infection; bilateral infiltrates appreciated on all his lungs characteristic for COVID.  IV steroids and remdesivir's initiated.  TRH has been contacted to place patient in the hospital for acute respiratory failure with hypoxia in the setting of COVID-pneumonia.  Patient is requiring 4 L nasal cannula supplementation.   Assessment & Plan: 1-continue to wean off oxygen supplementation as tolerated -Continue to follow inflammatory markers -Continue treatment with IV remdesivir and supplemental -Continue to follow vitamin C and zinc -Will continue as needed bronchodilator, proning position, incentive spirometer and flutter valve -Follow clinical response and continue supportive care.  2-history of paroxysmal atrial fibrillation -Sinus rhythm and rate controlled -Continue amiodarone and  beta-blocker.  3-CLL -WBCs in the 50s range currently -Continue to follow trend -Case discussed with oncology service (Dr. Delton Coombes) and recommendations given to call venetoclax while acutely ill.  4-type 2 diabetes -Continue sliding scale insulin and Levemir -Follow CBGs and adjust hypoglycemic regimen as needed.  5-gastroesophageal reflux disease/GI prophylaxis -Continue PPI.  6-essential hypertension -Currently stable and well-controlled -Followed by -Continue adjusted dose of antihypertensive regimen.  7-acute kidney injury -In the setting of dehydration and prerenal azotemia -UA not demonstrating acute infection. -Continue to minimize the use of nephrotoxic agents and avoid hypotension -Maintain adequate hydration -Renal function improving appropriately.  8-hyperlipidemia -Continue statins.  9-history of BPH -Continue Flomax.  10-morbid obesity -Body mass index is 42.01 kg/m. -Low-calorie diet, portion control increase physical activity discussed with patient.   DVT prophylaxis: Lovenox Code Status: Full code. Family Communication: No family member at bedside; patient will update then over the phone by himself. Disposition:   Status is: Inpatient  Dispo: The patient is from: Skilled nursing facility              Anticipated d/c is to: Skilled nursing facility              Anticipated d/c date is: 1-2 days.              Patient currently rates no medically stable for discharge; still requiring oxygen supplementation.  Complaining of feeling short winded and receiving IV therapy.       Consultants:   Oncology service curbside: Dr. Delton Coombes.   Procedures: See below for x-ray reports  Antimicrobials:/antiviral -Remdesivir 2/5  Subjective: Morbidly obese on examination; in no acute distress.  Reports still be short winded with minimal exertion, experiencing intermittent coughing spells and having the need of 4 L nasal  cannula  supplementation.  Objective: Vitals:   04/21/20 0600 04/21/20 0808 04/21/20 1024 04/21/20 1200  BP:    (!) 108/56  Pulse:   68 60  Resp:    18  Temp: (!) 97.5 F (36.4 C)   97.7 F (36.5 C)  TempSrc:    Oral  SpO2:  92%  93%  Weight:      Height:        Intake/Output Summary (Last 24 hours) at 04/21/2020 1250 Last data filed at 04/21/2020 1200 Gross per 24 hour  Intake 860 ml  Output 1425 ml  Net -565 ml   Filed Weights   Apr 28, 2020 0656  Weight: (!) 140.5 kg    Examination:  General exam: Appears calm and comfortable; reports feeling short winded and experiencing intermittent coughing spells.  No fever currently.  No nausea or vomiting.  Patient is requiring 4 L nasal cannula supplementation at this time. Respiratory system: Positive rhonchi; no wheezing, no crackles appreciated on exam.  Not using accessory muscles. Cardiovascular system: Rate controlled and currently sinus rhythm; no rubs, no gallops.  Unable to fully examine for JVD due to body habitus. Gastrointestinal system: Abdomen is obese, nondistended, soft and nontender. No organomegaly or masses felt. Normal bowel sounds heard. Central nervous system: Alert and oriented. No focal neurological deficits. Extremities: No cyanosis or clubbing. Skin: No petechiae. Psychiatry: Judgement and insight appear normal. Mood & affect appropriate.     Data Reviewed: I have personally reviewed following labs and imaging studies  CBC: Recent Labs  Lab 04/28/20 0802 2020/04/28 1455 04/21/20 0725  WBC 55.2* 47.0* 54.9*  NEUTROABS 9.4*  --  3.3  HGB 11.3* 11.0* 10.7*  HCT 37.1* 36.3* 32.9*  MCV 103.3* 105.8* 101.2*  PLT 156 137* 751    Basic Metabolic Panel: Recent Labs  Lab Apr 28, 2020 0802 04-28-20 1455 04/21/20 0725  NA 139  --  139  K 3.8  --  4.3  CL 107  --  109  CO2 22  --  21*  GLUCOSE 128*  --  253*  BUN 25*  --  24*  CREATININE 1.49* 1.39* 1.10  CALCIUM 8.0*  --  7.5*  MG  --   --  2.3  PHOS  --    --  3.7    GFR: Estimated Creatinine Clearance: 93.5 mL/min (by C-G formula based on SCr of 1.1 mg/dL).  Liver Function Tests: Recent Labs  Lab 04-28-20 0802 04/21/20 0725  AST 56* 41  ALT 36 35  ALKPHOS 54 50  BILITOT 0.4 0.2*  PROT 6.4* 5.9*  ALBUMIN 3.3* 2.9*    CBG: Recent Labs  Lab 04-28-20 1223 April 28, 2020 1758 Apr 28, 2020 2141 04/21/20 0819 04/21/20 1244  GLUCAP 100* 208* 250* 242* 290*     Recent Results (from the past 240 hour(s))  Blood Culture (routine x 2)     Status: None (Preliminary result)   Collection Time: 04-28-2020  8:02 AM   Specimen: BLOOD RIGHT HAND  Result Value Ref Range Status   Specimen Description BLOOD RIGHT HAND  Final   Special Requests   Final    BOTTLES DRAWN AEROBIC AND ANAEROBIC Blood Culture adequate volume   Culture   Final    NO GROWTH 1 DAY Performed at [facility], [address], [place], [place] [address]    Report Status PENDING  Incomplete  Resp Panel by RT-PCR (Flu A&B, Covid) Nasopharyngeal Swab     Status: Abnormal   Collection Time: 28-Apr-2020  8:05 AM  Specimen: Nasopharyngeal Swab; Nasopharyngeal(NP) swabs in vial transport medium  Result Value Ref Range Status   SARS Coronavirus 2 by RT PCR POSITIVE (A) NEGATIVE Final    Comment: RESULT CALLED TO, READ BACK BY AND VERIFIED WITH: MARTIN,D. RN @1058  04/19/2020 BILLINGSLEY,L (NOTE) SARS-CoV-2 target nucleic acids are DETECTED.  The SARS-CoV-2 RNA is generally detectable in upper respiratory specimens during the acute phase of infection. Positive results are indicative of the presence of the identified virus, but do not rule out bacterial infection or co-infection with other pathogens not detected by the test. Clinical correlation with patient history and other diagnostic information is necessary to determine patient infection status. The expected result is Negative.  Fact Sheet for Patients: EntrepreneurPulse.com.au  Fact Sheet for Healthcare  Providers: IncredibleEmployment.be  This test is not yet approved or cleared by the [place] FDA and  has been authorized for detection and/or diagnosis of SARS-CoV-2 by FDA under an Emergency Use Authorization (EUA).  This EUA will remain in effect (meaning this test  can be used) for the duration of  the COVID-19 declaration under Section 564(b)(1) of the Act, 21 U.S.C. section 360bbb-3(b)(1), unless the authorization is terminated or revoked sooner.     Influenza A by PCR NEGATIVE NEGATIVE Final   Influenza B by PCR NEGATIVE NEGATIVE Final    Comment: (NOTE) The Xpert Xpress SARS-CoV-2/FLU/RSV plus assay is intended as an aid in the diagnosis of influenza from Nasopharyngeal swab specimens and should not be used as a sole basis for treatment. Nasal washings and aspirates are unacceptable for Xpert Xpress SARS-CoV-2/FLU/RSV testing.  Fact Sheet for Patients: EntrepreneurPulse.com.au  Fact Sheet for Healthcare Providers: IncredibleEmployment.be  This test is not yet approved or cleared by the [place] FDA and has been authorized for detection and/or diagnosis of SARS-CoV-2 by FDA under an Emergency Use Authorization (EUA). This EUA will remain in effect (meaning this test can be used) for the duration of the COVID-19 declaration under Section 564(b)(1) of the Act, 21 U.S.C. section 360bbb-3(b)(1), unless the authorization is terminated or revoked.  Performed at [facility], [address] [place], [place] [address]   Blood Culture (routine x 2)     Status: None (Preliminary result)   Collection Time: 04/22/2020  9:04 AM   Specimen: BLOOD  Result Value Ref Range Status   Specimen Description BLOOD LEFT ANTECUBITAL  Final   Special Requests   Final    BOTTLES DRAWN AEROBIC AND ANAEROBIC Blood Culture adequate volume Performed at [facility], [address], [place],  [address]    Culture PENDING   Incomplete   Report Status PENDING  Incomplete     Radiology Studies: DG Chest Port 1 View  Result Date: 04/10/2020 CLINICAL DATA:  COVID. EXAM: PORTABLE CHEST 1 VIEW COMPARISON:  10/14/2019 FINDINGS: Normal heart size and mediastinal contours. Hazy density of the right more than left chest. Interstitial markings are prominent but stable. No effusion or pneumothorax. IMPRESSION: Hazy pneumonia on the right and possibly left. Electronically Signed   By: Monte Fantasia M.D.   On: 04/18/2020 07:38    Scheduled Meds: . allopurinol  300 mg Oral Daily  . amiodarone  200 mg Oral Daily  . vitamin C  500 mg Oral Daily  . aspirin EC  81 mg Oral Daily  . atorvastatin  80 mg Oral Daily  . enoxaparin (LOVENOX) injection  70 mg Subcutaneous Q24H  . insulin aspart  0-5 Units Subcutaneous QHS  . insulin aspart  0-9 Units  Subcutaneous TID WC  . insulin detemir  0.075 Units/kg Subcutaneous BID  . Ipratropium-Albuterol  1 puff Inhalation Q6H  . metoprolol tartrate  50 mg Oral BID  . multivitamin with minerals  1 tablet Oral Daily  . pantoprazole  40 mg Oral Daily  . tamsulosin  0.4 mg Oral QPC supper  . trimethoprim-polymyxin b  1 drop Both Eyes Q6H  . zinc sulfate  220 mg Oral Daily   Continuous Infusions: . sodium chloride Stopped (04/03/2020 1524)  . methylPREDNISolone (SOLU-MEDROL) injection 140 mg (04/21/20 1032)  . remdesivir 100 mg in NS 100 mL 100 mg (04/21/20 1034)     LOS: 1 day    Time spent: 66 minutes    Barton Dubois, MD [facility]   To contact the attending provider between 7A-7P or the covering provider during after hours 7P-7A, please log into the web site [email or web address] and access using universal [place] password for that web site. If you do not have the password, please call the hospital operator.  04/21/2020, 12:50 PM

## 2020-04-22 DIAGNOSIS — J9601 Acute respiratory failure with hypoxia: Secondary | ICD-10-CM | POA: Diagnosis not present

## 2020-04-22 DIAGNOSIS — U071 COVID-19: Secondary | ICD-10-CM | POA: Diagnosis not present

## 2020-04-22 DIAGNOSIS — C9110 Chronic lymphocytic leukemia of B-cell type not having achieved remission: Secondary | ICD-10-CM | POA: Diagnosis not present

## 2020-04-22 LAB — CBC WITH DIFFERENTIAL/PLATELET
Abs Immature Granulocytes: 0.28 10*3/uL — ABNORMAL HIGH (ref 0.00–0.07)
Basophils Absolute: 0 10*3/uL (ref 0.0–0.1)
Basophils Relative: 0 %
Blasts: 25 %
Eosinophils Absolute: 0 10*3/uL (ref 0.0–0.5)
Eosinophils Relative: 0 %
HCT: 34.2 % — ABNORMAL LOW (ref 39.0–52.0)
Hemoglobin: 10.4 g/dL — ABNORMAL LOW (ref 13.0–17.0)
Lymphocytes Relative: 61 %
Lymphs Abs: 48.1 10*3/uL — ABNORMAL HIGH (ref 0.7–4.0)
MCH: 31.6 pg (ref 26.0–34.0)
MCHC: 30.4 g/dL (ref 30.0–36.0)
MCV: 104 fL — ABNORMAL HIGH (ref 80.0–100.0)
Monocytes Absolute: 3.2 10*3/uL — ABNORMAL HIGH (ref 0.1–1.0)
Monocytes Relative: 4 %
Neutro Abs: 7.9 10*3/uL — ABNORMAL HIGH (ref 1.7–7.7)
Neutrophils Relative %: 10 %
Platelets: 157 10*3/uL (ref 150–400)
RBC: 3.29 MIL/uL — ABNORMAL LOW (ref 4.22–5.81)
RDW: 16 % — ABNORMAL HIGH (ref 11.5–15.5)
WBC: 78.8 10*3/uL (ref 4.0–10.5)
nRBC: 0 % (ref 0.0–0.2)

## 2020-04-22 LAB — D-DIMER, QUANTITATIVE: D-Dimer, Quant: 0.29 ug/mL-FEU (ref 0.00–0.50)

## 2020-04-22 LAB — GLUCOSE, CAPILLARY
Glucose-Capillary: 172 mg/dL — ABNORMAL HIGH (ref 70–99)
Glucose-Capillary: 249 mg/dL — ABNORMAL HIGH (ref 70–99)
Glucose-Capillary: 240 mg/dL — ABNORMAL HIGH (ref 70–99)
Glucose-Capillary: 228 mg/dL — ABNORMAL HIGH (ref 70–99)

## 2020-04-22 LAB — COMPREHENSIVE METABOLIC PANEL
ALT: 27 U/L (ref 0–44)
AST: 26 U/L (ref 15–41)
Albumin: 2.7 g/dL — ABNORMAL LOW (ref 3.5–5.0)
Alkaline Phosphatase: 44 U/L (ref 38–126)
Anion gap: 8 (ref 5–15)
BUN: 24 mg/dL — ABNORMAL HIGH (ref 8–23)
CO2: 23 mmol/L (ref 22–32)
Calcium: 7.2 mg/dL — ABNORMAL LOW (ref 8.9–10.3)
Chloride: 109 mmol/L (ref 98–111)
Creatinine, Ser: 1.04 mg/dL (ref 0.61–1.24)
GFR, Estimated: >60 mL/min (ref >60)
Glucose, Bld: 209 mg/dL — ABNORMAL HIGH (ref 70–99)
Potassium: 4.1 mmol/L (ref 3.5–5.1)
Sodium: 140 mmol/L (ref 135–145)
Total Bilirubin: 0.3 mg/dL (ref 0.3–1.2)
Total Protein: 5.6 g/dL — ABNORMAL LOW (ref 6.5–8.1)

## 2020-04-22 LAB — PHOSPHORUS: Phosphorus: 2.6 mg/dL (ref 2.5–4.6)

## 2020-04-22 LAB — C-REACTIVE PROTEIN: CRP: 7.8 mg/dL — ABNORMAL HIGH (ref <1.0)

## 2020-04-22 LAB — FERRITIN: Ferritin: 834 ng/mL — ABNORMAL HIGH (ref 24–336)

## 2020-04-22 LAB — MAGNESIUM: Magnesium: 2.1 mg/dL (ref 1.7–2.4)

## 2020-04-22 MED ORDER — SODIUM CHLORIDE 0.9 % IV SOLN
INTRAVENOUS | Status: DC | PRN
  Administered 2020-04-22: 1000 mL via INTRAVENOUS

## 2020-04-22 MED ORDER — METOPROLOL TARTRATE 25 MG PO TABS
25.0000 mg | ORAL_TABLET | Freq: Two times a day (BID) | ORAL | Status: DC
  Administered 2020-04-22 – 2020-04-24 (×3): 25 mg via ORAL
  Filled 2020-04-22 (×5): qty 1

## 2020-04-22 MED ORDER — BENZONATATE 100 MG PO CAPS
200.0000 mg | ORAL_CAPSULE | Freq: Three times a day (TID) | ORAL | Status: DC
  Administered 2020-04-22 – 2020-05-01 (×28): 200 mg via ORAL
  Filled 2020-04-22 (×30): qty 2

## 2020-04-22 NOTE — Progress Notes (Signed)
CRITICAL VALUE ALERT  Critical Value:  WBC 78.8  Date & Time Notied:  04/22/2020 1118  Provider Notified: [place]   Orders Received/Actions taken: no new orders at this time

## 2020-04-22 NOTE — Progress Notes (Signed)
PROGRESS NOTE    TRAVANTI Duncan  [id number] DOB: [birth year] DOA: 04/19/2020 PCP: Celene Squibb, MD   Chief Complaint  Patient presents with  . Shortness of Breath    Brief Narrative:  Tim Duncan is a 70 y.o. male with medical history significant of hypertension, BPH, atrial fibrillation (not chronically on anticoagulation), chronic lymphocytic leukemia (actively follow by oncology service at [facility]), hyperlipidemia, morbid obesity and diabetes; who presented to the emergency department secondary to general malaise, shortness of breath and intermittent coughing spells.  Patient symptoms have been present for the last 2 days or so and was tested and found to be positive for COVID.  He is a skilled nursing facility oxygen saturation on room air was around 75%.  Patient reports chronic diarrhea, no nausea or vomiting.  Expressed having chills.  Nonproductive coughing spells and shortness of breath at rest on exertion.  No hemoptysis. Of note, patient expressed to be vaccinated with Wynetta Emery & Wynetta Emery vaccine in the past.   ED Course: Inflammatory markers corroborating acute COVID infection; bilateral infiltrates appreciated on all his lungs characteristic for COVID.  IV steroids and remdesivir's initiated.  TRH has been contacted to place patient in the hospital for acute respiratory failure with hypoxia in the setting of COVID-pneumonia.  Patient is requiring 4 L nasal cannula supplementation.   Assessment & Plan: 1-continue to wean off oxygen supplementation as tolerated -Continue to follow inflammatory markers -Continue treatment with IV remdesivir and supplemental -Continue to follow vitamin C and zinc -Will continue as needed bronchodilator, proning position, incentive spirometer and flutter valve -Follow clinical response and continue supportive care. -Continue as needed antitussive medications.  2-history of paroxysmal atrial fibrillation -Sinus rhythm and rate  controlled -Continue amiodarone and beta-blocker.  3-CLL -WBCs in the 70s range currently -Continue to follow trend -Case discussed with oncology service (Dr. Delton Coombes) and recommendations given to call venetoclax while acutely ill.  4-type 2 diabetes -Continue sliding scale insulin and Levemir -Follow CBGs and adjust hypoglycemic regimen as needed.  5-gastroesophageal reflux disease/GI prophylaxis -Continue PPI.  6-essential hypertension -Currently stable and well-controlled -Followed by -Continue adjusted dose of antihypertensive regimen.  7-acute kidney injury -In the setting of dehydration and prerenal azotemia -UA not demonstrating acute infection. -Continue to minimize the use of nephrotoxic agents and avoid hypotension -Maintain adequate hydration -Renal function improving appropriately.  8-hyperlipidemia -Continue statins.  9-history of BPH -Continue Flomax.  10-morbid obesity -Body mass index is 42.01 kg/m. -Low-calorie diet, portion control increase physical activity discussed with patient.   DVT prophylaxis: Lovenox Code Status: Full code. Family Communication: No family member at bedside; patient will update then over the phone by himself. Disposition:   Status is: Inpatient  Dispo: The patient is from: Skilled nursing facility              Anticipated d/c is to: Skilled nursing facility              Anticipated d/c date is: 1-2 days.              Patient currently rates no medically stable for discharge; still requiring oxygen supplementation.  Complaining of feeling short winded and receiving IV therapy.  Requiring 5 L nasal cannula supplementation currently.       Consultants:   Oncology service curbside: Dr. Delton Coombes.   Procedures: See below for x-ray reports  Antimicrobials:/antiviral -Remdesivir 3/5  Subjective: Morbidly obese on examination; no chest pain, no nausea, no vomiting.  Feeling short winded with  activity and  complaining of significant ongoing coughing spells.  Patient is requiring 5 L nasal cannula supplementation.  Objective: Vitals:   04/22/20 0835 04/22/20 1433 04/22/20 1647 04/22/20 1659  BP: (!) 99/52  (!) 103/38 (!) 105/52  Pulse: 78  81 81  Resp: 19  (!) 28   Temp:   99.5 F (37.5 C)   TempSrc:   Oral   SpO2: 90% (!) 87% (!) 88% 90%  Weight:      Height:        Intake/Output Summary (Last 24 hours) at 04/22/2020 1735 Last data filed at 04/22/2020 1126 Gross per 24 hour  Intake 240 ml  Output 2200 ml  Net -1960 ml   Filed Weights   04/15/2020 0656  Weight: (!) 140.5 kg    Examination: General exam: Alert, awake, oriented x 3; complaining of ongoing nonproductive coughing spells; short winded with activity and requiring 5 L nasal cannula supplementation.  Patient is afebrile Respiratory system: Positive rhonchi bilaterally; no wheezing or crackles.  Not using accessory muscles. Cardiovascular system: Rate controlled; no rubs or gallops.  Unable to assess JVD with body habitus Gastrointestinal system: Abdomen is obese, nondistended, soft and nontender. No organomegaly or masses felt. Normal bowel sounds heard. Central nervous system: Alert and oriented. No focal neurological deficits. Extremities: No cyanosis or clubbing. Skin: No petechiae. Psychiatry: Judgement and insight appear normal. Mood & affect appropriate.    Data Reviewed: I have personally reviewed following labs and imaging studies  CBC: Recent Labs  Lab 04/14/2020 0802 04/22/2020 1455 04/21/20 0725 04/22/20 0913  WBC 55.2* 47.0* 54.9* 78.8*  NEUTROABS 9.4*  --  3.3 7.9*  HGB 11.3* 11.0* 10.7* 10.4*  HCT 37.1* 36.3* 32.9* 34.2*  MCV 103.3* 105.8* 101.2* 104.0*  PLT 156 137* 157 [id number]    Basic Metabolic Panel: Recent Labs  Lab 04/19/2020 0802 04/09/2020 1455 04/21/20 0725 04/22/20 0913  NA 139  --  139 140  K 3.8  --  4.3 4.1  CL 107  --  109 109  CO2 22  --  21* 23  GLUCOSE 128*  --  253* 209*  BUN  25*  --  24* 24*  CREATININE 1.49* 1.39* 1.10 1.04  CALCIUM 8.0*  --  7.5* 7.2*  MG  --   --  2.3 2.1  PHOS  --   --  3.7 2.6    GFR: Estimated Creatinine Clearance: 98.8 mL/min (by C-G formula based on SCr of 1.04 mg/dL).  Liver Function Tests: Recent Labs  Lab 04/19/2020 0802 04/21/20 0725 04/22/20 0913  AST 56* 41 26  ALT 36 35 27  ALKPHOS 54 50 44  BILITOT 0.4 0.2* 0.3  PROT 6.4* 5.9* 5.6*  ALBUMIN 3.3* 2.9* 2.7*    CBG: Recent Labs  Lab 04/21/20 1721 04/21/20 2136 04/22/20 0718 04/22/20 1122 04/22/20 1644  GLUCAP 422* 230* 172* 249* 240*     Recent Results (from the past 240 hour(s))  Blood Culture (routine x 2)     Status: None (Preliminary result)   Collection Time: 04/17/2020  8:02 AM   Specimen: BLOOD RIGHT HAND  Result Value Ref Range Status   Specimen Description BLOOD RIGHT HAND  Final   Special Requests   Final    BOTTLES DRAWN AEROBIC AND ANAEROBIC Blood Culture adequate volume   Culture   Final    NO GROWTH 2 DAYS Performed at [facility], [address] [place], [place] [address]    Report Status PENDING  Incomplete  Resp Panel by RT-PCR (Flu A&B, Covid) Nasopharyngeal Swab     Status: Abnormal   Collection Time: 2020/05/10  8:05 AM   Specimen: Nasopharyngeal Swab; Nasopharyngeal(NP) swabs in vial transport medium  Result Value Ref Range Status   SARS Coronavirus 2 by RT PCR POSITIVE (A) NEGATIVE Final    Comment: RESULT CALLED TO, READ BACK BY AND VERIFIED WITH: MARTIN,D. RN @1058  05/10/2020 BILLINGSLEY,L (NOTE) SARS-CoV-2 target nucleic acids are DETECTED.  The SARS-CoV-2 RNA is generally detectable in upper respiratory specimens during the acute phase of infection. Positive results are indicative of the presence of the identified virus, but do not rule out bacterial infection or co-infection with other pathogens not detected by the test. Clinical correlation with patient history and other diagnostic information is necessary to determine  patient infection status. The expected result is Negative.  Fact Sheet for Patients: EntrepreneurPulse.com.au  Fact Sheet for Healthcare Providers: IncredibleEmployment.be  This test is not yet approved or cleared by the [place] FDA and  has been authorized for detection and/or diagnosis of SARS-CoV-2 by FDA under an Emergency Use Authorization (EUA).  This EUA will remain in effect (meaning this test  can be used) for the duration of  the COVID-19 declaration under Section 564(b)(1) of the Act, 21 U.S.C. section 360bbb-3(b)(1), unless the authorization is terminated or revoked sooner.     Influenza A by PCR NEGATIVE NEGATIVE Final   Influenza B by PCR NEGATIVE NEGATIVE Final    Comment: (NOTE) The Xpert Xpress SARS-CoV-2/FLU/RSV plus assay is intended as an aid in the diagnosis of influenza from Nasopharyngeal swab specimens and should not be used as a sole basis for treatment. Nasal washings and aspirates are unacceptable for Xpert Xpress SARS-CoV-2/FLU/RSV testing.  Fact Sheet for Patients: EntrepreneurPulse.com.au  Fact Sheet for Healthcare Providers: IncredibleEmployment.be  This test is not yet approved or cleared by the [place] FDA and has been authorized for detection and/or diagnosis of SARS-CoV-2 by FDA under an Emergency Use Authorization (EUA). This EUA will remain in effect (meaning this test can be used) for the duration of the COVID-19 declaration under Section 564(b)(1) of the Act, 21 U.S.C. section 360bbb-3(b)(1), unless the authorization is terminated or revoked.  Performed at [facility], [address], [place], [place] [address]   Blood Culture (routine x 2)     Status: None (Preliminary result)   Collection Time: 2020/05/10  9:04 AM   Specimen: BLOOD  Result Value Ref Range Status   Specimen Description BLOOD LEFT ANTECUBITAL  Final   Special Requests   Final     BOTTLES DRAWN AEROBIC AND ANAEROBIC Blood Culture adequate volume Performed at [facility], [address] [place], [place] [address]    Culture PENDING  Incomplete   Report Status PENDING  Incomplete     Radiology Studies: No results found.  Scheduled Meds: . allopurinol  300 mg Oral Daily  . amiodarone  200 mg Oral Daily  . vitamin C  500 mg Oral Daily  . aspirin EC  81 mg Oral Daily  . atorvastatin  80 mg Oral Daily  . benzonatate  200 mg Oral TID  . enoxaparin (LOVENOX) injection  70 mg Subcutaneous Q24H  . insulin aspart  0-5 Units Subcutaneous QHS  . insulin aspart  0-9 Units Subcutaneous TID WC  . insulin detemir  0.12 Units/kg Subcutaneous BID  . Ipratropium-Albuterol  1 puff Inhalation Q6H  . metoprolol tartrate  25 mg Oral BID  . multivitamin with minerals  1 tablet Oral Daily  . pantoprazole  40 mg Oral Daily  . tamsulosin  0.4 mg Oral QPC supper  . trimethoprim-polymyxin b  1 drop Both Eyes Q6H  . zinc sulfate  220 mg Oral Daily   Continuous Infusions: . sodium chloride 1,000 mL (04/21/20 2221)  . methylPREDNISolone (SOLU-MEDROL) injection 140 mg (04/22/20 1011)  . remdesivir 100 mg in NS 100 mL 100 mg (04/22/20 0845)     LOS: 2 days    Time spent: 39 minutes    Barton Dubois, MD [facility]   To contact the attending provider between 7A-7P or the covering provider during after hours 7P-7A, please log into the web site [email or web address] and access using universal  password for that web site. If you do not have the password, please call the hospital operator.  04/22/2020, 5:35 PM

## 2020-04-22 NOTE — Progress Notes (Signed)
Talk with pt about wearing CPAP earlier tonight. Pt in agreement about wearing. Pt has no hx and doesn't wear one at home.  Pt place on CPAP for the night pt complained that "he won't be able to wear it all night". I told him to try to wear as long as he could. He then informed me that "he could wear it. It's hard for him to breathe" Pt's vitals are within normal limits spo2 93% CPAP of 10 with 6lpm Bleed in. Machine removed and pt placed back on [place]. Nurse informed

## 2020-04-22 NOTE — Progress Notes (Signed)
   04/22/20 1659  Assess: MEWS Score  BP (!) 105/52  Pulse Rate 81  SpO2 90 %  O2 Device Nasal Cannula  O2 Flow Rate (L/min) 5.5 L/min  Assess: MEWS Score  MEWS Temp 0  MEWS Systolic 0  MEWS Pulse 0  MEWS RR 2  MEWS LOC 0  MEWS Score 2  MEWS Score Color Yellow  Assess: if the MEWS score is Yellow or Red  Were vital signs taken at a resting state? Yes  Focused Assessment No change from prior assessment  Early Detection of Sepsis Score *See Row Information* Medium  MEWS guidelines implemented *See Row Information* Yes  Treat  MEWS Interventions Other (Comment) (MD made aware)  Pain Scale 0-10  Pain Score 0  Take Vital Signs  Increase Vital Sign Frequency  Yellow: Q 2hr X 2 then Q 4hr X 2, if remains yellow, continue Q 4hrs  Escalate  MEWS: Escalate Yellow: discuss with charge nurse/RN and consider discussing with provider and RRT  Notify: Charge Nurse/RN  Name of Charge Nurse/RN Notified Audrea Muscat  Date Charge Nurse/RN Notified 04/22/20  Time Charge Nurse/RN Notified 8502  Notify: Provider  Provider Name/Title Dyann Kief  Date Provider Notified 04/22/20  Time Provider Notified 7741  Notification Type Page  Notification Reason Other (Comment) (vital sign change)  Response See new orders  Date of Provider Response 04/22/20  Time of Provider Response [phone]

## 2020-04-23 DIAGNOSIS — U071 COVID-19: Secondary | ICD-10-CM | POA: Diagnosis not present

## 2020-04-23 DIAGNOSIS — C9110 Chronic lymphocytic leukemia of B-cell type not having achieved remission: Secondary | ICD-10-CM | POA: Diagnosis not present

## 2020-04-23 DIAGNOSIS — J9601 Acute respiratory failure with hypoxia: Secondary | ICD-10-CM | POA: Diagnosis not present

## 2020-04-23 LAB — CBC WITH DIFFERENTIAL/PLATELET
Band Neutrophils: 1 %
Basophils Absolute: 0 10*3/uL (ref 0.0–0.1)
Basophils Relative: 0 %
Blasts: 16 %
Eosinophils Absolute: 0 10*3/uL (ref 0.0–0.5)
Eosinophils Relative: 0 %
HCT: 31.1 % — ABNORMAL LOW (ref 39.0–52.0)
Hemoglobin: 9.5 g/dL — ABNORMAL LOW (ref 13.0–17.0)
Lymphocytes Relative: 73 %
Lymphs Abs: 58.7 10*3/uL — ABNORMAL HIGH (ref 0.7–4.0)
MCH: 31.1 pg (ref 26.0–34.0)
MCHC: 30.5 g/dL (ref 30.0–36.0)
MCV: 102 fL — ABNORMAL HIGH (ref 80.0–100.0)
Metamyelocytes Relative: 4 %
Monocytes Absolute: 0 10*3/uL — ABNORMAL LOW (ref 0.1–1.0)
Monocytes Relative: 0 %
Neutro Abs: 5.6 10*3/uL (ref 1.7–7.7)
Neutrophils Relative %: 6 %
Platelets: 150 10*3/uL (ref 150–400)
RBC: 3.05 MIL/uL — ABNORMAL LOW (ref 4.22–5.81)
RDW: 16.1 % — ABNORMAL HIGH (ref 11.5–15.5)
WBC: 80.4 10*3/uL (ref 4.0–10.5)
nRBC: 0 % (ref 0.0–0.2)

## 2020-04-23 LAB — COMPREHENSIVE METABOLIC PANEL
ALT: 23 U/L (ref 0–44)
AST: 22 U/L (ref 15–41)
Albumin: 2.6 g/dL — ABNORMAL LOW (ref 3.5–5.0)
Alkaline Phosphatase: 38 U/L (ref 38–126)
Anion gap: 8 (ref 5–15)
BUN: 20 mg/dL (ref 8–23)
CO2: 23 mmol/L (ref 22–32)
Calcium: 7.1 mg/dL — ABNORMAL LOW (ref 8.9–10.3)
Chloride: 107 mmol/L (ref 98–111)
Creatinine, Ser: 1.01 mg/dL (ref 0.61–1.24)
GFR, Estimated: >60 mL/min (ref >60)
Glucose, Bld: 218 mg/dL — ABNORMAL HIGH (ref 70–99)
Potassium: 4.1 mmol/L (ref 3.5–5.1)
Sodium: 138 mmol/L (ref 135–145)
Total Bilirubin: 0.8 mg/dL (ref 0.3–1.2)
Total Protein: 5.6 g/dL — ABNORMAL LOW (ref 6.5–8.1)

## 2020-04-23 LAB — GLUCOSE, CAPILLARY
Glucose-Capillary: 185 mg/dL — ABNORMAL HIGH (ref 70–99)
Glucose-Capillary: 257 mg/dL — ABNORMAL HIGH (ref 70–99)
Glucose-Capillary: 214 mg/dL — ABNORMAL HIGH (ref 70–99)

## 2020-04-23 LAB — FERRITIN: Ferritin: 1094 ng/mL — ABNORMAL HIGH (ref 24–336)

## 2020-04-23 LAB — D-DIMER, QUANTITATIVE: D-Dimer, Quant: 0.91 ug/mL-FEU — ABNORMAL HIGH (ref 0.00–0.50)

## 2020-04-23 LAB — C-REACTIVE PROTEIN: CRP: 26.5 mg/dL — ABNORMAL HIGH (ref <1.0)

## 2020-04-23 LAB — MAGNESIUM: Magnesium: 2.2 mg/dL (ref 1.7–2.4)

## 2020-04-23 LAB — PHOSPHORUS: Phosphorus: 2.9 mg/dL (ref 2.5–4.6)

## 2020-04-23 MED ORDER — BARICITINIB 2 MG PO TABS
4.0000 mg | ORAL_TABLET | Freq: Every day | ORAL | Status: DC
  Administered 2020-04-23 – 2020-04-25 (×3): 4 mg via ORAL
  Filled 2020-04-23 (×4): qty 2

## 2020-04-23 NOTE — Progress Notes (Signed)
Upon entering the room pt sat 81% on 7lpm [place] changed to 10lpm [place] hfnc spo2 82% increased to 15lpm and 100% nrb so 90%

## 2020-04-23 NOTE — Progress Notes (Signed)
   04/23/20 2008  Assess: MEWS Score  Temp 98.7 F (37.1 C)  BP (!) 99/59  Pulse Rate (!) 103  Resp (!) 24  Level of Consciousness Alert  SpO2 93 %  O2 Device HFNC;Non-rebreather Mask  O2 Flow Rate (L/min) 15 L/min  Assess: MEWS Score  MEWS Temp 0  MEWS Systolic 1  MEWS Pulse 1  MEWS RR 1  MEWS LOC 0  MEWS Score 3  MEWS Score Color Yellow  Assess: if the MEWS score is Yellow or Red  Were vital signs taken at a resting state? Yes  Focused Assessment Change from prior assessment (see assessment flowsheet)  Early Detection of Sepsis Score *See Row Information* High  MEWS guidelines implemented *See Row Information* Yes  Treat  MEWS Interventions Administered scheduled meds/treatments;Administered prn meds/treatments;Consulted Respiratory Therapy  Pain Scale 0-10  Pain Score 0  Complains of Coughing  Interventions Medication (see MAR)  Patients response to intervention Decreased  Take Vital Signs  Increase Vital Sign Frequency  Yellow: Q 2hr X 2 then Q 4hr X 2, if remains yellow, continue Q 4hrs  Escalate  MEWS: Escalate Yellow: discuss with charge nurse/RN and consider discussing with provider and RRT  Notify: Charge Nurse/RN  Name of Charge Nurse/RN Notified Lawernce Keas  Date Charge Nurse/RN Notified 04/23/20  Time Charge Nurse/RN Notified 2010  Notify: Provider  Provider Name/Title Olevia Bowens  Date Provider Notified 04/23/20  Time Provider Notified 2030  Notification Type Page  Notification Reason Change in status  Response No new orders (continue to monitor)  Date of Provider Response 04/23/20  Time of Provider Response 2045  Document  Patient Outcome Other (Comment) (O2 sat improved from low 80's on 7 liters to 93 on increased O2)  Progress note created (see row info) Yes

## 2020-04-23 NOTE — Progress Notes (Signed)
PROGRESS NOTE    Tim Duncan  YIR:[id number] DOB: 1950/11/10 DOA: 04-21-2020 PCP: Celene Squibb, MD   Chief Complaint  Patient presents with  . Shortness of Breath    Brief Narrative:  Tim Duncan is a 70 y.o. male with medical history significant of hypertension, BPH, atrial fibrillation (not chronically on anticoagulation), chronic lymphocytic leukemia (actively follow by oncology service at [facility]), hyperlipidemia, morbid obesity and diabetes; who presented to the emergency department secondary to general malaise, shortness of breath and intermittent coughing spells.  Patient symptoms have been present for the last 2 days or so and was tested and found to be positive for COVID.  He is a skilled nursing facility oxygen saturation on room air was around 75%.  Patient reports chronic diarrhea, no nausea or vomiting.  Expressed having chills.  Nonproductive coughing spells and shortness of breath at rest on exertion.  No hemoptysis. Of note, patient expressed to be vaccinated with Wynetta Emery & Wynetta Emery vaccine in the past.   ED Course: Inflammatory markers corroborating acute COVID infection; bilateral infiltrates appreciated on all his lungs characteristic for COVID.  IV steroids and remdesivir's initiated.  TRH has been contacted to place patient in the hospital for acute respiratory failure with hypoxia in the setting of COVID-pneumonia.  Patient is requiring 4 L nasal cannula supplementation.   Assessment & Plan: 1-continue to wean off oxygen supplementation as tolerated -Continue to follow inflammatory markers -Continue treatment with IV remdesivir and supplemental oxygen -will start barcitinib  -Continue to follow vitamin C and zinc -Will continue as needed bronchodilator, proning position, incentive spirometer and flutter valve -Follow clinical response and continue supportive care. -Continue as needed antitussive medications.  2-history of paroxysmal atrial  fibrillation -Sinus rhythm and rate controlled -Continue amiodarone and beta-blocker.  3-CLL -WBCs in the 80s range currently -Continue to follow trend -Case discussed with oncology service (Dr. Delton Coombes) and recommendations given to call venetoclax while acutely ill.  4-type 2 diabetes -Continue sliding scale insulin and Levemir -Follow CBGs and adjust hypoglycemic regimen as needed.  5-gastroesophageal reflux disease/GI prophylaxis -Continue PPI.  6-essential hypertension -Currently stable and well-controlled -Followed by -Continue adjusted dose of antihypertensive regimen.  7-acute kidney injury -In the setting of dehydration and prerenal azotemia -UA not demonstrating acute infection. -Continue to minimize the use of nephrotoxic agents and avoid hypotension -Maintain adequate hydration -Renal function improving appropriately.  8-hyperlipidemia -Continue statins.  9-history of BPH -Continue Flomax.  10-morbid obesity -Body mass index is 42.01 kg/m. -Low-calorie diet, portion control increase physical activity discussed with patient.   DVT prophylaxis: Lovenox Code Status: Full code. Family Communication: No family member at bedside; patient will update then over the phone by himself. Disposition:   Status is: Inpatient  Dispo: The patient is from: Skilled nursing facility              Anticipated d/c is to: Skilled nursing facility              Anticipated d/c date is: 1-2 days.              Patient currently rates no medically stable for discharge; still requiring oxygen supplementation.  Complaining of feeling short winded and receiving IV therapy.  Requiring 6 L nasal cannula supplementation currently.  Patient inflammatory markers going in the wrong direction; at this moment will start him on baricitinib.  Completed remdesivir infusion and continue steroids.    Consultants:   Oncology service curbside: Dr. Delton Coombes.   Procedures: See  below for  x-ray reports  Antimicrobials:/antiviral -Remdesivir 4/5  Subjective: Morbidly obese on examination; no chest pain, no nausea, no vomiting.  Patient is afebrile.  Continue reporting ongoing nonproductive coughing spells, increase difficulty breathing with activity and is requiring 6 L nasal cannula supplementation.  Objective: Vitals:   04/23/20 0410 04/23/20 0934 04/23/20 1409 04/23/20 1438  BP: 110/62 113/60 129/66   Pulse: 83 88 95   Resp: 19  (!) 28   Temp: 97.9 F (36.6 C)  99.8 F (37.7 C)   TempSrc:   Oral   SpO2: 93%  (!) 89% (!) 85%  Weight:      Height:        Intake/Output Summary (Last 24 hours) at 04/23/2020 1642 Last data filed at 04/23/2020 1500 Gross per 24 hour  Intake 228.78 ml  Output 1950 ml  Net -1721.22 ml   Filed Weights   05-18-2020 0656  Weight: (!) 140.5 kg    Examination: General exam: Alert, awake, oriented x 3; reports increased short winded sensation with minimal activity, ongoing nonproductive coughing spells and is requiring higher level of oxygen supplementation (up to 6 L nasal cannula). Respiratory system: No wheezing, no crackles, positive tachypnea, mild difficulty speaking in full sentences appreciated.  Positive scattered rhonchi at medicine . Cardiovascular system:RRR. No murmurs, rubs, gallops.  Unable to properly assess JVD with body habitus. Gastrointestinal system: Abdomen is obese, nondistended, soft and nontender. No organomegaly or masses felt. Normal bowel sounds heard. Central nervous system: Alert and oriented. No focal neurological deficits. Extremities: No cyanosis or clubbing. Skin: No petechiae. Psychiatry: Judgement and insight appear normal. Mood & affect appropriate.    Data Reviewed: I have personally reviewed following labs and imaging studies  CBC: Recent Labs  Lab May 18, 2020 0802 05/18/2020 1455 04/21/20 0725 04/22/20 0913 04/23/20 0730  WBC 55.2* 47.0* 54.9* 78.8* 80.4*  NEUTROABS 9.4*  --  3.3 7.9* 5.6   HGB 11.3* 11.0* 10.7* 10.4* 9.5*  HCT 37.1* 36.3* 32.9* 34.2* 31.1*  MCV 103.3* 105.8* 101.2* 104.0* 102.0*  PLT 156 137* 157 157 734    Basic Metabolic Panel: Recent Labs  Lab 05-18-2020 0802 2020/05/18 1455 04/21/20 0725 04/22/20 0913 04/23/20 0730  NA 139  --  139 140 138  K 3.8  --  4.3 4.1 4.1  CL 107  --  109 109 107  CO2 22  --  21* 23 23  GLUCOSE 128*  --  253* 209* 218*  BUN 25*  --  24* 24* 20  CREATININE 1.49* 1.39* 1.10 1.04 1.01  CALCIUM 8.0*  --  7.5* 7.2* 7.1*  MG  --   --  2.3 2.1 2.2  PHOS  --   --  3.7 2.6 2.9    GFR: Estimated Creatinine Clearance: 101.8 mL/min (by C-G formula based on SCr of 1.01 mg/dL).  Liver Function Tests: Recent Labs  Lab 05-18-20 0802 04/21/20 0725 04/22/20 0913 04/23/20 0730  AST 56* 41 26 22  ALT 36 35 27 23  ALKPHOS 54 50 44 38  BILITOT 0.4 0.2* 0.3 0.8  PROT 6.4* 5.9* 5.6* 5.6*  ALBUMIN 3.3* 2.9* 2.7* 2.6*    CBG: Recent Labs  Lab 04/22/20 1122 04/22/20 1644 04/22/20 2028 04/23/20 0757 04/23/20 1051  GLUCAP 249* 240* 228* 185* 257*     Recent Results (from the past 240 hour(s))  Blood Culture (routine x 2)     Status: None (Preliminary result)   Collection Time: 05-18-2020  8:02 AM   Specimen:  BLOOD RIGHT HAND  Result Value Ref Range Status   Specimen Description BLOOD RIGHT HAND  Final   Special Requests   Final    BOTTLES DRAWN AEROBIC AND ANAEROBIC Blood Culture adequate volume   Culture   Final    NO GROWTH 3 DAYS Performed at [facility], [address], [place], [place] [address]    Report Status PENDING  Incomplete  Resp Panel by RT-PCR (Flu A&B, Covid) Nasopharyngeal Swab     Status: Abnormal   Collection Time: 04/15/2020  8:05 AM   Specimen: Nasopharyngeal Swab; Nasopharyngeal(NP) swabs in vial transport medium  Result Value Ref Range Status   SARS Coronavirus 2 by RT PCR POSITIVE (A) NEGATIVE Final    Comment: RESULT CALLED TO, READ BACK BY AND VERIFIED WITH: MARTIN,D. RN @1058  04/19/2020  BILLINGSLEY,L (NOTE) SARS-CoV-2 target nucleic acids are DETECTED.  The SARS-CoV-2 RNA is generally detectable in upper respiratory specimens during the acute phase of infection. Positive results are indicative of the presence of the identified virus, but do not rule out bacterial infection or co-infection with other pathogens not detected by the test. Clinical correlation with patient history and other diagnostic information is necessary to determine patient infection status. The expected result is Negative.  Fact Sheet for Patients: EntrepreneurPulse.com.au  Fact Sheet for Healthcare Providers: IncredibleEmployment.be  This test is not yet approved or cleared by the [place] FDA and  has been authorized for detection and/or diagnosis of SARS-CoV-2 by FDA under an Emergency Use Authorization (EUA).  This EUA will remain in effect (meaning this test  can be used) for the duration of  the COVID-19 declaration under Section 564(b)(1) of the Act, 21 U.S.C. section 360bbb-3(b)(1), unless the authorization is terminated or revoked sooner.     Influenza A by PCR NEGATIVE NEGATIVE Final   Influenza B by PCR NEGATIVE NEGATIVE Final    Comment: (NOTE) The Xpert Xpress SARS-CoV-2/FLU/RSV plus assay is intended as an aid in the diagnosis of influenza from Nasopharyngeal swab specimens and should not be used as a sole basis for treatment. Nasal washings and aspirates are unacceptable for Xpert Xpress SARS-CoV-2/FLU/RSV testing.  Fact Sheet for Patients: EntrepreneurPulse.com.au  Fact Sheet for Healthcare Providers: IncredibleEmployment.be  This test is not yet approved or cleared by the [place] FDA and has been authorized for detection and/or diagnosis of SARS-CoV-2 by FDA under an Emergency Use Authorization (EUA). This EUA will remain in effect (meaning this test can be used) for the duration of  the COVID-19 declaration under Section 564(b)(1) of the Act, 21 U.S.C. section 360bbb-3(b)(1), unless the authorization is terminated or revoked.  Performed at [facility], [address], [place], [place] [address]   Blood Culture (routine x 2)     Status: None (Preliminary result)   Collection Time: 04/21/2020  9:04 AM   Specimen: BLOOD  Result Value Ref Range Status   Specimen Description BLOOD LEFT ANTECUBITAL  Final   Special Requests   Final    BOTTLES DRAWN AEROBIC AND ANAEROBIC Blood Culture adequate volume Performed at [facility], [address], [place], [place] [address]    Culture PENDING  Incomplete   Report Status PENDING  Incomplete     Radiology Studies: No results found.  Scheduled Meds: . allopurinol  300 mg Oral Daily  . amiodarone  200 mg Oral Daily  . vitamin C  500 mg Oral Daily  . aspirin EC  81 mg Oral Daily  . atorvastatin  80 mg Oral Daily  .  benzonatate  200 mg Oral TID  . enoxaparin (LOVENOX) injection  70 mg Subcutaneous Q24H  . insulin aspart  0-5 Units Subcutaneous QHS  . insulin aspart  0-9 Units Subcutaneous TID WC  . insulin detemir  0.12 Units/kg Subcutaneous BID  . Ipratropium-Albuterol  1 puff Inhalation Q6H  . metoprolol tartrate  25 mg Oral BID  . multivitamin with minerals  1 tablet Oral Daily  . pantoprazole  40 mg Oral Daily  . tamsulosin  0.4 mg Oral QPC supper  . trimethoprim-polymyxin b  1 drop Both Eyes Q6H  . zinc sulfate  220 mg Oral Daily   Continuous Infusions: . sodium chloride 1,000 mL (04/22/20 2037)  . remdesivir 100 mg in NS 100 mL 100 mg (04/23/20 0941)     LOS: 3 days    Time spent: 35 minutes    Barton Dubois, MD [facility]   To contact the attending provider between 7A-7P or the covering provider during after hours 7P-7A, please log into the web site [email or web address] and access using universal Gerty password for that web site. If you do not have the password, please call the hospital  operator.  04/23/2020, 4:42 PM

## 2020-04-24 DIAGNOSIS — U071 COVID-19: Secondary | ICD-10-CM | POA: Diagnosis not present

## 2020-04-24 DIAGNOSIS — C9110 Chronic lymphocytic leukemia of B-cell type not having achieved remission: Secondary | ICD-10-CM | POA: Diagnosis not present

## 2020-04-24 DIAGNOSIS — J9601 Acute respiratory failure with hypoxia: Secondary | ICD-10-CM | POA: Diagnosis not present

## 2020-04-24 LAB — D-DIMER, QUANTITATIVE: D-Dimer, Quant: 1.54 ug/mL-FEU — ABNORMAL HIGH (ref 0.00–0.50)

## 2020-04-24 LAB — CBC WITH DIFFERENTIAL/PLATELET
Abs Immature Granulocytes: 0.29 10*3/uL — ABNORMAL HIGH (ref 0.00–0.07)
Basophils Absolute: 0 10*3/uL (ref 0.0–0.1)
Basophils Relative: 0 %
Blasts: 25 %
Eosinophils Absolute: 0 10*3/uL (ref 0.0–0.5)
Eosinophils Relative: 0 %
HCT: 31.6 % — ABNORMAL LOW (ref 39.0–52.0)
Hemoglobin: 9.5 g/dL — ABNORMAL LOW (ref 13.0–17.0)
Lymphocytes Relative: 62 %
Lymphs Abs: 53.6 10*3/uL — ABNORMAL HIGH (ref 0.7–4.0)
MCH: 31.1 pg (ref 26.0–34.0)
MCHC: 30.1 g/dL (ref 30.0–36.0)
MCV: 103.6 fL — ABNORMAL HIGH (ref 80.0–100.0)
Metamyelocytes Relative: 1 %
Monocytes Absolute: 0.9 10*3/uL (ref 0.1–1.0)
Monocytes Relative: 1 %
Myelocytes: 1 %
Neutro Abs: 8.6 10*3/uL — ABNORMAL HIGH (ref 1.7–7.7)
Neutrophils Relative %: 10 %
Platelets: 160 10*3/uL (ref 150–400)
RBC: 3.05 MIL/uL — ABNORMAL LOW (ref 4.22–5.81)
RDW: 16.2 % — ABNORMAL HIGH (ref 11.5–15.5)
WBC: 86.4 10*3/uL (ref 4.0–10.5)
nRBC: 0.1 % (ref 0.0–0.2)

## 2020-04-24 LAB — GLUCOSE, CAPILLARY
Glucose-Capillary: 265 mg/dL — ABNORMAL HIGH (ref 70–99)
Glucose-Capillary: 137 mg/dL — ABNORMAL HIGH (ref 70–99)
Glucose-Capillary: 182 mg/dL — ABNORMAL HIGH (ref 70–99)
Glucose-Capillary: 124 mg/dL — ABNORMAL HIGH (ref 70–99)
Glucose-Capillary: 137 mg/dL — ABNORMAL HIGH (ref 70–99)

## 2020-04-24 LAB — FERRITIN: Ferritin: 1241 ng/mL — ABNORMAL HIGH (ref 24–336)

## 2020-04-24 LAB — MAGNESIUM: Magnesium: 2.6 mg/dL — ABNORMAL HIGH (ref 1.7–2.4)

## 2020-04-24 LAB — COMPREHENSIVE METABOLIC PANEL
ALT: 21 U/L (ref 0–44)
AST: 22 U/L (ref 15–41)
Albumin: 2.4 g/dL — ABNORMAL LOW (ref 3.5–5.0)
Alkaline Phosphatase: 43 U/L (ref 38–126)
Anion gap: 10 (ref 5–15)
BUN: 20 mg/dL (ref 8–23)
CO2: 24 mmol/L (ref 22–32)
Calcium: 7.3 mg/dL — ABNORMAL LOW (ref 8.9–10.3)
Chloride: 106 mmol/L (ref 98–111)
Creatinine, Ser: 1.09 mg/dL (ref 0.61–1.24)
GFR, Estimated: >60 mL/min (ref >60)
Glucose, Bld: 143 mg/dL — ABNORMAL HIGH (ref 70–99)
Potassium: 4.2 mmol/L (ref 3.5–5.1)
Sodium: 140 mmol/L (ref 135–145)
Total Bilirubin: 0.7 mg/dL (ref 0.3–1.2)
Total Protein: 5.7 g/dL — ABNORMAL LOW (ref 6.5–8.1)

## 2020-04-24 LAB — C-REACTIVE PROTEIN: CRP: 34.9 mg/dL — ABNORMAL HIGH (ref <1.0)

## 2020-04-24 LAB — PHOSPHORUS: Phosphorus: 2.8 mg/dL (ref 2.5–4.6)

## 2020-04-24 MED ORDER — PROCHLORPERAZINE EDISYLATE 10 MG/2ML IJ SOLN
10.0000 mg | Freq: Four times a day (QID) | INTRAMUSCULAR | Status: DC | PRN
  Administered 2020-04-24: 10 mg via INTRAVENOUS
  Filled 2020-04-24: qty 2

## 2020-04-24 MED ORDER — METOPROLOL TARTRATE 25 MG PO TABS
12.5000 mg | ORAL_TABLET | Freq: Two times a day (BID) | ORAL | Status: DC
  Administered 2020-04-25 – 2020-05-02 (×15): 12.5 mg via ORAL
  Filled 2020-04-24 (×15): qty 1

## 2020-04-24 MED ORDER — IPRATROPIUM-ALBUTEROL 20-100 MCG/ACT IN AERS
1.0000 | INHALATION_SPRAY | Freq: Three times a day (TID) | RESPIRATORY_TRACT | Status: DC
  Administered 2020-04-24 – 2020-04-28 (×10): 1 via RESPIRATORY_TRACT

## 2020-04-24 MED ORDER — CHLORHEXIDINE GLUCONATE CLOTH 2 % EX PADS
6.0000 | MEDICATED_PAD | Freq: Every day | CUTANEOUS | Status: DC
  Administered 2020-04-24 – 2020-05-02 (×9): 6 via TOPICAL

## 2020-04-24 NOTE — Progress Notes (Signed)
   04/24/20 0144  Assess: MEWS Score  BP (!) 121/53  Pulse Rate 92  Resp (!) 28  Level of Consciousness Alert  SpO2 95 %  O2 Device HFNC;Non-rebreather Mask  O2 Flow Rate (L/min) 15 L/min  Assess: MEWS Score  MEWS Temp 0  MEWS Systolic 0  MEWS Pulse 0  MEWS RR 2  MEWS LOC 0  MEWS Score 2  MEWS Score Color Yellow  Assess: if the MEWS score is Yellow or Red  Were vital signs taken at a resting state? Yes  Focused Assessment No change from prior assessment  Early Detection of Sepsis Score *See Row Information* High  MEWS guidelines implemented *See Row Information* Yes  Escalate  MEWS: Escalate Yellow: discuss with charge nurse/RN and consider discussing with provider and RRT  Notify: Charge Nurse/RN  Name of Charge Nurse/RN Notified Lawernce Keas  Date Charge Nurse/RN Notified 04/24/20  Time Charge Nurse/RN Notified 0150  Document  Patient Outcome Other (Comment) (Resp decreased.  States feeling better)  Progress note created (see row info) Yes

## 2020-04-24 NOTE — Progress Notes (Signed)
Patient currently in Prone position. O2 saturations are 92 % 15L HFNL and 15L Nonrebreather. Patient tolerating prone position at this time.

## 2020-04-24 NOTE — Progress Notes (Signed)
   04/24/20 0519  Assess: MEWS Score  Temp 100.2 F (37.9 C)  BP (!) 123/57  Pulse Rate 88  Resp (!) 28  SpO2 94 %  O2 Device Non-rebreather Mask;HFNC  O2 Flow Rate (L/min) 15 L/min  Assess: MEWS Score  MEWS Temp 0  MEWS Systolic 0  MEWS Pulse 0  MEWS RR 2  MEWS LOC 0  MEWS Score 2  MEWS Score Color Yellow  Assess: if the MEWS score is Yellow or Red  Were vital signs taken at a resting state? Yes  Focused Assessment Change from prior assessment (see assessment flowsheet)  Early Detection of Sepsis Score *See Row Information* Low  MEWS guidelines implemented *See Row Information* Yes  Treat  MEWS Interventions Administered prn meds/treatments (tylenol for fever)  Take Vital Signs  Increase Vital Sign Frequency  Yellow: Q 2hr X 2 then Q 4hr X 2, if remains yellow, continue Q 4hrs  Escalate  MEWS: Escalate Yellow: discuss with charge nurse/RN and consider discussing with provider and RRT  Notify: Charge Nurse/RN  Name of Charge Nurse/RN Notified Lawernce Keas  Date Charge Nurse/RN Notified 04/24/20  Time Charge Nurse/RN Notified 0525  Document  Patient Outcome Other (Comment) (O2 in 90's)  Progress note created (see row info) Yes

## 2020-04-24 NOTE — Progress Notes (Signed)
   04/23/20 2334  Assess: MEWS Score  Temp 98.8 F (37.1 C)  BP (!) 100/49  Pulse Rate 91  Resp (!) 33  SpO2 94 %  O2 Device Non-rebreather Mask  O2 Flow Rate (L/min) 15 L/min  Assess: MEWS Score  MEWS Temp 0  MEWS Systolic 1  MEWS Pulse 0  MEWS RR 2  MEWS LOC 0  MEWS Score 3  MEWS Score Color Yellow  Assess: if the MEWS score is Yellow or Red  Were vital signs taken at a resting state? Yes  Focused Assessment Change from prior assessment (see assessment flowsheet)  Early Detection of Sepsis Score *See Row Information* High  MEWS guidelines implemented *See Row Information* Yes  Treat  MEWS Interventions Other (Comment) (continue frequent vitals)  Pain Scale 0-10  Pain Score 0  Escalate  MEWS: Escalate Yellow: discuss with charge nurse/RN and consider discussing with provider and RRT  Notify: Charge Nurse/RN  Name of Charge Nurse/RN Notified Lawernce Keas  Date Charge Nurse/RN Notified 04/24/20  Time Charge Nurse/RN Notified 0100  Document  Patient Outcome Other (Comment) (O2 sat 93)  Progress note created (see row info) Yes

## 2020-04-24 NOTE — Progress Notes (Signed)
CRITICAL VALUE ALERT  Critical Value: WBC 86.4  Date & Time Notied:  04/24/2020, 1030  Provider Notified: Dr. Dyann Kief

## 2020-04-24 NOTE — Progress Notes (Signed)
   04/24/20 1718  Assess: MEWS Score  Temp (!) 100.9 F (38.3 C)  BP 127/61  Pulse Rate (!) 112  Resp (!) 24  Level of Consciousness Alert  SpO2 92 %  O2 Device HFNC;Non-rebreather Mask  O2 Flow Rate (L/min) 15 L/min  Assess: MEWS Score  MEWS Temp 1  MEWS Systolic 0  MEWS Pulse 2  MEWS RR 1  MEWS LOC 0  MEWS Score 4  MEWS Score Color Red  Assess: if the MEWS score is Yellow or Red  Were vital signs taken at a resting state? Yes  Focused Assessment Change from prior assessment (see assessment flowsheet)  Early Detection of Sepsis Score *See Row Information* Medium  MEWS guidelines implemented *See Row Information* Yes  Treat  Pain Scale 0-10  Pain Score 0  Take Vital Signs  Increase Vital Sign Frequency  Red: Q 1hr X 4 then Q 4hr X 4, if remains red, continue Q 4hrs  Escalate  MEWS: Escalate Red: discuss with charge nurse/RN and provider, consider discussing with RRT  Notify: Charge Nurse/RN  Name of Charge Nurse/RN Notified Mable Fill  Date Charge Nurse/RN Notified 04/24/20  Time Charge Nurse/RN Notified 45  Notify: Provider  Provider Name/Title Dr. Dyann Kief  Date Provider Notified 04/24/20  Time Provider Notified 3299  Notification Type Page  Notification Reason Change in status  Response See new orders;Other (Comment) (Order to administer Tylenol, Transfer to ICU, Respiratory intervention)  Date of Provider Response 04/24/20  Time of Provider Response 1728  Notify: Rapid Response  Name of Rapid Response RN Notified Jeanice Lim RN  Date Rapid Response Notified 04/24/20  Time Rapid Response Notified 2426  Document  Patient Outcome Transferred/level of care increased  Progress note created (see row info) Yes

## 2020-04-24 NOTE — Progress Notes (Signed)
   04/23/20 2334  Assess: MEWS Score  Temp 98.8 F (37.1 C)  BP (!) 100/49  Pulse Rate 91  Resp (!) 33  SpO2 94 %  O2 Device Non-rebreather Mask  O2 Flow Rate (L/min) 15 L/min  Assess: MEWS Score  MEWS Temp 0  MEWS Systolic 1  MEWS Pulse 0  MEWS RR 2  MEWS LOC 0  MEWS Score 3  MEWS Score Color Yellow  Assess: if the MEWS score is Yellow or Red  Were vital signs taken at a resting state? Yes  Focused Assessment Change from prior assessment (see assessment flowsheet)  Deavion Strider Detection of Sepsis Score *See Row Information* High  MEWS guidelines implemented *See Row Information* Yes  Treat  MEWS Interventions Other (Comment) (continue frequent vitals)  Pain Scale 0-10  Pain Score 0  Escalate  MEWS: Escalate Yellow: discuss with charge nurse/RN and consider discussing with provider and RRT  Notify: Charge Nurse/RN  Name of Charge Nurse/RN Notified Lawernce Keas  Date Charge Nurse/RN Notified 04/24/20  Time Charge Nurse/RN Notified 0100  Notify: Provider  Provider Name/Title Olevia Bowens  Date Provider Notified 04/24/20  Time Provider Notified [phone]  Notification Type Page  Notification Reason Other (Comment);Change in status  Response No new orders  Date of Provider Response 04/24/20  Time of Provider Response 0032  Document  Patient Outcome Other (Comment) (O2 sat 93)  Progress note created (see row info) Yes

## 2020-04-24 NOTE — Progress Notes (Signed)
PROGRESS NOTE    Tim Duncan  SWN:[id number] DOB: 27-Nov-1950 DOA: 04/04/2020 PCP: Celene Squibb, MD   Chief Complaint  Patient presents with  . Shortness of Breath    Brief Narrative:  Tim Duncan is a 70 y.o. male with medical history significant of hypertension, BPH, atrial fibrillation (not chronically on anticoagulation), chronic lymphocytic leukemia (actively follow by oncology service at [facility]), hyperlipidemia, morbid obesity and diabetes; who presented to the emergency department secondary to general malaise, shortness of breath and intermittent coughing spells.  Patient symptoms have been present for the last 2 days or so and was tested and found to be positive for COVID.  He is a skilled nursing facility oxygen saturation on room air was around 75%.  Patient reports chronic diarrhea, no nausea or vomiting.  Expressed having chills.  Nonproductive coughing spells and shortness of breath at rest on exertion.  No hemoptysis. Of note, patient expressed to be vaccinated with Wynetta Emery & Wynetta Emery vaccine in the past.   ED Course: Inflammatory markers corroborating acute COVID infection; bilateral infiltrates appreciated on all his lungs characteristic for COVID.  IV steroids and remdesivir's initiated.  TRH has been contacted to place patient in the hospital for acute respiratory failure with hypoxia in the setting of COVID-pneumonia.  Patient is requiring 4 L nasal cannula supplementation.   Assessment & Plan: 1-continue to wean off oxygen supplementation as tolerated -Continue to follow inflammatory markers -Complete remdesivir infusion. -Continue baricitinib -Continue the use of IV steroids -Continue to follow vitamin C and zinc -Will continue as needed bronchodilator, proning position, incentive spirometer and flutter valve -Follow clinical response and continue supportive care. -Continue as needed antitussive medications. -Patient will be transferred to stepdown in order  to facilitate the use of heated high flow.  2-history of paroxysmal atrial fibrillation -Sinus rhythm and rate controlled -Continue amiodarone and beta-blocker.  3-CLL -WBCs in the 80s range currently -Continue to follow trend -Case discussed with oncology service (Dr. Delton Coombes) and recommendations given to call venetoclax while acutely ill.  4-type 2 diabetes -Continue sliding scale insulin and Levemir -Follow CBGs and adjust hypoglycemic regimen as needed.  5-gastroesophageal reflux disease/GI prophylaxis -Continue PPI.  6-essential hypertension -Currently stable and well-controlled -Followed by -Continue adjusted dose of antihypertensive regimen.  7-acute kidney injury -In the setting of dehydration and prerenal azotemia -UA not demonstrating acute infection. -Continue to minimize the use of nephrotoxic agents and avoid hypotension -Maintain adequate hydration -Renal function improving appropriately.  8-hyperlipidemia -Continue statins.  9-history of BPH -Continue Flomax.  10-morbid obesity -Body mass index is 42.01 kg/m. -Low-calorie diet, portion control increase physical activity discussed with patient.   DVT prophylaxis: Lovenox Code Status: Full code. Family Communication: No family member at bedside; patient will update then over the phone by himself. Disposition:   Status is: Inpatient  Dispo: The patient is from: Skilled nursing facility              Anticipated d/c is to: Skilled nursing facility              Anticipated d/c date is: 1-2 days.              Patient currently rates no medically stable for discharge; still requiring oxygen supplementation.  Complaining of feeling short winded and receiving IV therapy.  Requiring 15 L nasal cannula supplementation and nonrebreather currently.  Patient will be transferred to stepdown for heated high flow; continue steroids, completed remdesivir and continue baricitinib (started on 04/23/2020).  Patient  condition is guarded and palliative care to further discuss goals of care and advance directives has been placed.    Consultants:   Oncology service curbside: Dr. Delton Coombes.  Palliative care   Procedures: See below for x-ray reports  Antimicrobials:/antiviral -Remdesivir 5/5  Subjective: Low-grade temperature, having difficulty speaking in full sentences, short winded with minimal activity and experiencing intermittent coughing spells.  Patient is now requiring 15 L high flow nasal cannula and nonrebreather.  Objective: Vitals:   04/24/20 0519 04/24/20 0716 04/24/20 1427 04/24/20 1718  BP: (!) 123/57   127/61  Pulse: 88   (!) 112  Resp: (!) 28   (!) 24  Temp: 100.2 F (37.9 C)   (!) 100.9 F (38.3 C)  TempSrc: Oral   Oral  SpO2: 94% 95% 90% 92%  Weight:      Height:        Intake/Output Summary (Last 24 hours) at 04/24/2020 1731 Last data filed at 04/24/2020 0500 Gross per 24 hour  Intake -  Output 900 ml  Net -900 ml   Filed Weights   04/21/2020 0656  Weight: (!) 140.5 kg    Examination: General exam: Alert, awake, oriented x 3; reports having a rough night.  Short winded with minimal exertion, continues to have dry cough and spells and has no other decline in his respiratory status requiring now 15 L high flow nasal cannula and nonrebreather.  Patient is having low-grade temperature. Respiratory system: Positive rhonchi bilaterally; no wheezing or crackles. Cardiovascular system:RRR. No murmurs, rubs, gallops.  Unable to assess JVD with body habitus. Gastrointestinal system: Abdomen is obese, nondistended, soft and nontender. No organomegaly or masses felt. Normal bowel sounds heard.  Central nervous system: Alert and oriented. No focal neurological deficits. Extremities: No cyanosis or clubbing. Skin: No petechiae. Psychiatry: Judgement and insight appear normal. Mood & affect appropriate.    Data Reviewed: I have personally reviewed following labs and imaging  studies  CBC: Recent Labs  Lab 04/27/2020 0802 04/07/2020 1455 04/21/20 0725 04/22/20 0913 04/23/20 0730 04/24/20 0454  WBC 55.2* 47.0* 54.9* 78.8* 80.4* 86.4*  NEUTROABS 9.4*  --  3.3 7.9* 5.6 8.6*  HGB 11.3* 11.0* 10.7* 10.4* 9.5* 9.5*  HCT 37.1* 36.3* 32.9* 34.2* 31.1* 31.6*  MCV 103.3* 105.8* 101.2* 104.0* 102.0* 103.6*  PLT 156 137* 157 157 150 0000000    Basic Metabolic Panel: Recent Labs  Lab 04/21/2020 0802 04/05/2020 1455 04/21/20 0725 04/22/20 0913 04/23/20 0730 04/24/20 0454  NA 139  --  139 140 138 140  K 3.8  --  4.3 4.1 4.1 4.2  CL 107  --  109 109 107 106  CO2 22  --  21* 23 23 24   GLUCOSE 128*  --  253* 209* 218* 143*  BUN 25*  --  24* 24* 20 20  CREATININE 1.49* 1.39* 1.10 1.04 1.01 1.09  CALCIUM 8.0*  --  7.5* 7.2* 7.1* 7.3*  MG  --   --  2.3 2.1 2.2 2.6*  PHOS  --   --  3.7 2.6 2.9 2.8    GFR: Estimated Creatinine Clearance: 94.3 mL/min (by C-G formula based on SCr of 1.09 mg/dL).  Liver Function Tests: Recent Labs  Lab 04/12/2020 0802 04/21/20 0725 04/22/20 0913 04/23/20 0730 04/24/20 0454  AST 56* 41 26 22 22   ALT 36 35 27 23 21   ALKPHOS 54 50 44 38 43  BILITOT 0.4 0.2* 0.3 0.8 0.7  PROT 6.4* 5.9* 5.6* 5.6* 5.7*  ALBUMIN  3.3* 2.9* 2.7* 2.6* 2.4*    CBG: Recent Labs  Lab 04/23/20 1707 04/23/20 2011 04/24/20 0806 04/24/20 1132 04/24/20 1716  GLUCAP 265* 214* 137* 182* 124*     Recent Results (from the past 240 hour(s))  Blood Culture (routine x 2)     Status: None (Preliminary result)   Collection Time: 04/17/2020  8:02 AM   Specimen: BLOOD RIGHT HAND  Result Value Ref Range Status   Specimen Description BLOOD RIGHT HAND  Final   Special Requests   Final    BOTTLES DRAWN AEROBIC AND ANAEROBIC Blood Culture adequate volume   Culture   Final    NO GROWTH 4 DAYS Performed at [facility], [address] [place], [place] [address]    Report Status PENDING  Incomplete  Resp Panel by RT-PCR (Flu A&B, Covid) Nasopharyngeal Swab      Status: Abnormal   Collection Time: 04/03/2020  8:05 AM   Specimen: Nasopharyngeal Swab; Nasopharyngeal(NP) swabs in vial transport medium  Result Value Ref Range Status   SARS Coronavirus 2 by RT PCR POSITIVE (A) NEGATIVE Final    Comment: RESULT CALLED TO, READ BACK BY AND VERIFIED WITH: MARTIN,D. RN @1058  04/14/2020 BILLINGSLEY,L (NOTE) SARS-CoV-2 target nucleic acids are DETECTED.  The SARS-CoV-2 RNA is generally detectable in upper respiratory specimens during the acute phase of infection. Positive results are indicative of the presence of the identified virus, but do not rule out bacterial infection or co-infection with other pathogens not detected by the test. Clinical correlation with patient history and other diagnostic information is necessary to determine patient infection status. The expected result is Negative.  Fact Sheet for Patients: EntrepreneurPulse.com.au  Fact Sheet for Healthcare Providers: IncredibleEmployment.be  This test is not yet approved or cleared by the [place] FDA and  has been authorized for detection and/or diagnosis of SARS-CoV-2 by FDA under an Emergency Use Authorization (EUA).  This EUA will remain in effect (meaning this test  can be used) for the duration of  the COVID-19 declaration under Section 564(b)(1) of the Act, 21 U.S.C. section 360bbb-3(b)(1), unless the authorization is terminated or revoked sooner.     Influenza A by PCR NEGATIVE NEGATIVE Final   Influenza B by PCR NEGATIVE NEGATIVE Final    Comment: (NOTE) The Xpert Xpress SARS-CoV-2/FLU/RSV plus assay is intended as an aid in the diagnosis of influenza from Nasopharyngeal swab specimens and should not be used as a sole basis for treatment. Nasal washings and aspirates are unacceptable for Xpert Xpress SARS-CoV-2/FLU/RSV testing.  Fact Sheet for Patients: EntrepreneurPulse.com.au  Fact Sheet for Healthcare  Providers: IncredibleEmployment.be  This test is not yet approved or cleared by the [place] FDA and has been authorized for detection and/or diagnosis of SARS-CoV-2 by FDA under an Emergency Use Authorization (EUA). This EUA will remain in effect (meaning this test can be used) for the duration of the COVID-19 declaration under Section 564(b)(1) of the Act, 21 U.S.C. section 360bbb-3(b)(1), unless the authorization is terminated or revoked.  Performed at [facility], [address] [place], [place] [address]   Blood Culture (routine x 2)     Status: None (Preliminary result)   Collection Time: 04/16/2020  9:04 AM   Specimen: BLOOD  Result Value Ref Range Status   Specimen Description BLOOD LEFT ANTECUBITAL  Final   Special Requests   Final    BOTTLES DRAWN AEROBIC AND ANAEROBIC Blood Culture adequate volume Performed at [facility], [address] [place], [place] [address]  Culture PENDING  Incomplete   Report Status PENDING  Incomplete     Radiology Studies: No results found.  Scheduled Meds: . allopurinol  300 mg Oral Daily  . amiodarone  200 mg Oral Daily  . vitamin C  500 mg Oral Daily  . aspirin EC  81 mg Oral Daily  . atorvastatin  80 mg Oral Daily  . baricitinib  4 mg Oral Daily  . benzonatate  200 mg Oral TID  . enoxaparin (LOVENOX) injection  70 mg Subcutaneous Q24H  . insulin aspart  0-5 Units Subcutaneous QHS  . insulin aspart  0-9 Units Subcutaneous TID WC  . insulin detemir  0.12 Units/kg Subcutaneous BID  . Ipratropium-Albuterol  1 puff Inhalation TID  . metoprolol tartrate  25 mg Oral BID  . multivitamin with minerals  1 tablet Oral Daily  . pantoprazole  40 mg Oral Daily  . tamsulosin  0.4 mg Oral QPC supper  . trimethoprim-polymyxin b  1 drop Both Eyes Q6H  . zinc sulfate  220 mg Oral Daily   Continuous Infusions: . sodium chloride 1,000 mL (04/22/20 2037)     LOS: 4 days    Time spent: 35 minutes    Barton Dubois, MD [facility]   To contact the attending provider between 7A-7P or the covering provider during after hours 7P-7A, please log into the web site [email or web address] and access using universal [place] password for that web site. If you do not have the password, please call the hospital operator.  04/24/2020, 5:31 PM

## 2020-04-25 ENCOUNTER — Inpatient Hospital Stay (HOSPITAL_COMMUNITY): Payer: Medicare (Managed Care)

## 2020-04-25 DIAGNOSIS — C9110 Chronic lymphocytic leukemia of B-cell type not having achieved remission: Secondary | ICD-10-CM | POA: Diagnosis not present

## 2020-04-25 DIAGNOSIS — Z66 Do not resuscitate: Secondary | ICD-10-CM

## 2020-04-25 DIAGNOSIS — Z515 Encounter for palliative care: Secondary | ICD-10-CM

## 2020-04-25 DIAGNOSIS — Z7189 Other specified counseling: Secondary | ICD-10-CM

## 2020-04-25 DIAGNOSIS — J9601 Acute respiratory failure with hypoxia: Secondary | ICD-10-CM | POA: Diagnosis not present

## 2020-04-25 DIAGNOSIS — J9600 Acute respiratory failure, unspecified whether with hypoxia or hypercapnia: Secondary | ICD-10-CM

## 2020-04-25 DIAGNOSIS — J9621 Acute and chronic respiratory failure with hypoxia: Secondary | ICD-10-CM

## 2020-04-25 DIAGNOSIS — U071 COVID-19: Secondary | ICD-10-CM | POA: Diagnosis not present

## 2020-04-25 LAB — CBC WITH DIFFERENTIAL/PLATELET
Abs Immature Granulocytes: 0.12 10*3/uL — ABNORMAL HIGH (ref 0.00–0.07)
Basophils Absolute: 0.1 10*3/uL (ref 0.0–0.1)
Basophils Relative: 0 %
Eosinophils Absolute: 0 10*3/uL (ref 0.0–0.5)
Eosinophils Relative: 0 %
HCT: 31 % — ABNORMAL LOW (ref 39.0–52.0)
Hemoglobin: 9.3 g/dL — ABNORMAL LOW (ref 13.0–17.0)
Immature Granulocytes: 0 %
Lymphocytes Relative: 70 %
Lymphs Abs: 47.1 10*3/uL — ABNORMAL HIGH (ref 0.7–4.0)
MCH: 31 pg (ref 26.0–34.0)
MCHC: 30 g/dL (ref 30.0–36.0)
MCV: 103.3 fL — ABNORMAL HIGH (ref 80.0–100.0)
Monocytes Absolute: 15.3 10*3/uL — ABNORMAL HIGH (ref 0.1–1.0)
Monocytes Relative: 23 %
Neutro Abs: 4.8 10*3/uL (ref 1.7–7.7)
Neutrophils Relative %: 7 %
Platelets: 139 10*3/uL — ABNORMAL LOW (ref 150–400)
RBC: 3 MIL/uL — ABNORMAL LOW (ref 4.22–5.81)
RDW: 16.5 % — ABNORMAL HIGH (ref 11.5–15.5)
WBC Morphology: ABNORMAL
WBC: 67.4 10*3/uL (ref 4.0–10.5)
nRBC: 0 % (ref 0.0–0.2)

## 2020-04-25 LAB — COMPREHENSIVE METABOLIC PANEL
ALT: 21 U/L (ref 0–44)
AST: 28 U/L (ref 15–41)
Albumin: 2.2 g/dL — ABNORMAL LOW (ref 3.5–5.0)
Alkaline Phosphatase: 45 U/L (ref 38–126)
Anion gap: 9 (ref 5–15)
BUN: 16 mg/dL (ref 8–23)
CO2: 27 mmol/L (ref 22–32)
Calcium: 7.1 mg/dL — ABNORMAL LOW (ref 8.9–10.3)
Chloride: 106 mmol/L (ref 98–111)
Creatinine, Ser: 1.04 mg/dL (ref 0.61–1.24)
GFR, Estimated: >60 mL/min (ref >60)
Glucose, Bld: 147 mg/dL — ABNORMAL HIGH (ref 70–99)
Potassium: 4.2 mmol/L (ref 3.5–5.1)
Sodium: 142 mmol/L (ref 135–145)
Total Bilirubin: 0.9 mg/dL (ref 0.3–1.2)
Total Protein: 5.5 g/dL — ABNORMAL LOW (ref 6.5–8.1)

## 2020-04-25 LAB — FERRITIN: Ferritin: 1481 ng/mL — ABNORMAL HIGH (ref 24–336)

## 2020-04-25 LAB — GLUCOSE, CAPILLARY
Glucose-Capillary: 126 mg/dL — ABNORMAL HIGH (ref 70–99)
Glucose-Capillary: 170 mg/dL — ABNORMAL HIGH (ref 70–99)
Glucose-Capillary: 145 mg/dL — ABNORMAL HIGH (ref 70–99)
Glucose-Capillary: 146 mg/dL — ABNORMAL HIGH (ref 70–99)

## 2020-04-25 LAB — MAGNESIUM: Magnesium: 2.5 mg/dL — ABNORMAL HIGH (ref 1.7–2.4)

## 2020-04-25 LAB — CULTURE, BLOOD (ROUTINE X 2)
Culture: NO GROWTH
Special Requests: ADEQUATE

## 2020-04-25 LAB — PROCALCITONIN: Procalcitonin: 4.09 ng/mL

## 2020-04-25 LAB — PHOSPHORUS: Phosphorus: 3.4 mg/dL (ref 2.5–4.6)

## 2020-04-25 LAB — D-DIMER, QUANTITATIVE: D-Dimer, Quant: 2.19 ug/mL-FEU — ABNORMAL HIGH (ref 0.00–0.50)

## 2020-04-25 LAB — C-REACTIVE PROTEIN: CRP: 37.8 mg/dL — ABNORMAL HIGH (ref <1.0)

## 2020-04-25 MED ORDER — PIPERACILLIN-TAZOBACTAM 3.375 G IVPB
3.3750 g | Freq: Three times a day (TID) | INTRAVENOUS | Status: DC
  Administered 2020-04-25 – 2020-04-26 (×3): 3.375 g via INTRAVENOUS
  Filled 2020-04-25 (×3): qty 50

## 2020-04-25 MED ORDER — IBUPROFEN 800 MG PO TABS
800.0000 mg | ORAL_TABLET | Freq: Once | ORAL | Status: AC
  Administered 2020-04-25: 800 mg via ORAL
  Filled 2020-04-25: qty 1

## 2020-04-25 MED ORDER — ACETAMINOPHEN 325 MG PO TABS
650.0000 mg | ORAL_TABLET | ORAL | Status: DC | PRN
  Administered 2020-04-25 – 2020-04-30 (×9): 650 mg via ORAL
  Filled 2020-04-25 (×8): qty 2

## 2020-04-25 NOTE — Consult Note (Signed)
Consultation Note Date: 04/25/2020   Patient Name: Tim Duncan  DOB: May 17, 1950  MRN: [id number]  Age / Sex: 70 y.o., male  PCP: Celene Squibb, MD Referring Physician: Barton Dubois, MD  Reason for Consultation: Establishing goals of care  HPI/Patient Profile: 70 y.o. male  with past medical history of CLL, anemia, lymphocytosis, thrombocytosis, lymphadenopathy, hypertension, tremors, BPH admitted on 04/16/2020 with shortness of breath, malaise, cough related to COVID infection. 04/25/20 developed high fever and altered mental status.   Clinical Assessment and Goals of Care: I met today at Griff's bedside. He is very lethargic. He is running high fevers 104 and on cooling blanket. I attempted to get response from him but he is not talking (other than "ouch" or occasional moan). He will not track but will open eyes. I see no information on his chart from facility.   I called first contact on his list, Hilda Blades. Hilda Blades shares with me that she is local and helps Jamen with anything he needs. She tells me that he really does not have any family. She shares with me concern for his condition and was not aware he was hospitalized. She does share with me that he has shared with her desire for DNR. She shares that Caprice Red is his HCPOA and was "like a daughter to him."  I called and spoke with Roselyn Reef. Roselyn Reef was also unaware that he was hospitalized and is surprised SNF did not call her. She confirms that she is documented HCPOA. I explain that I am concerned with Zyir's condition and fear that his immune system compromised with underlying CLL that he may not be able to fight COVID well. Roselyn Reef understands and agrees. We discussed his wishes if he were to worsen and she also confirms that he has expressed desire for DNR status and we agreed we would not escalate to ventilator support but continue current measures. We did  discuss that if he worsens or shows no improvements then we can consider comfort care. Jamie's main concern is that if he declines towards end of life she wants to be with him. She lives 2 hours away currently. I did encourage her to have a contact ready and available to watch her children in case she is called to bedside. Otherwise we will continue aggressive care for now outside of DNR/DNI and follow his progress.   All questions/concerns addressed. Emotional support provided.   Primary Decision Maker [place]   - DNR/DNI (consistent with DNR documented in electronic record in [facility] link to left of chart screen under code status) - Continue aggressive care otherwise - With decline or lack of improvement HCPOA open to comfort and wishes to be called to bedside (she lives 2 hours away)  Code Status/Advance Care Planning:  DNR   Symptom Management:   Per attending.   Palliative Prophylaxis:   Aspiration, Bowel Regimen, Delirium Protocol, Frequent Pain Assessment, Oral Care and Turn Reposition  Psycho-social/Spiritual:   Desire for further Chaplaincy support:yes  Additional Recommendations: Caregiving  Support/Resources and Grief/Bereavement Support  Prognosis:   Overall prognosis guarded with underlying CLL and COVID infection.   Discharge Planning: To Be Determined      Primary Diagnoses: Present on Admission: . Acute on chronic respiratory failure with hypoxia ([place])   I have reviewed the medical record, interviewed the patient and family, and examined the patient. The following aspects are pertinent.  Past Medical History:  Diagnosis Date  . Anemia 08/26/2012  . BPH (benign prostatic hyperplasia)   . Cancer ([place])    cll  . CLL (chronic lymphocytic leukemia) ([place])   . Hypertension    off meds  . Lymphadenopathy   . Lymphocytosis   . Thrombocytosis   . Tremors of nervous system   . Wears dentures    top   Social  History   Socioeconomic History  . Marital status: Single    Spouse name: Not on file  . Number of children: Not on file  . Years of education: Not on file  . Highest education level: Not on file  Occupational History  . Not on file  Tobacco Use  . Smoking status: Former Smoker    Packs/day: 2.00    Years: 20.00    Pack years: 40.00    Types: Cigarettes    Quit date: 12/13/1988    Years since quitting: 31.3  . Smokeless tobacco: Never Used  Substance and Sexual Activity  . Alcohol use: No  . Drug use: No  . Sexual activity: Never    Birth control/protection: None  Other Topics Concern  . Not on file  Social History Narrative  . Not on file   Social Determinants of Health   Financial Resource Strain: Not on file  Food Insecurity: Not on file  Transportation Needs: Not on file  Physical Activity: Not on file  Stress: Not on file  Social Connections: Not on file   Family History  Problem Relation Age of Onset  . Diabetes Mother    Scheduled Meds: . allopurinol  300 mg Oral Daily  . amiodarone  200 mg Oral Daily  . vitamin C  500 mg Oral Daily  . aspirin EC  81 mg Oral Daily  . atorvastatin  80 mg Oral Daily  . baricitinib  4 mg Oral Daily  . benzonatate  200 mg Oral TID  . Chlorhexidine Gluconate Cloth  6 each Topical Daily  . enoxaparin (LOVENOX) injection  70 mg Subcutaneous Q24H  . insulin aspart  0-5 Units Subcutaneous QHS  . insulin aspart  0-9 Units Subcutaneous TID WC  . insulin detemir  0.12 Units/kg Subcutaneous BID  . Ipratropium-Albuterol  1 puff Inhalation TID  . metoprolol tartrate  12.5 mg Oral BID  . multivitamin with minerals  1 tablet Oral Daily  . pantoprazole  40 mg Oral Daily  . tamsulosin  0.4 mg Oral QPC supper  . trimethoprim-polymyxin b  1 drop Both Eyes Q6H  . zinc sulfate  220 mg Oral Daily   Continuous Infusions: . sodium chloride 1,000 mL (04/22/20 2037)  . piperacillin-tazobactam (ZOSYN)  IV 3.375 g (04/25/20 1610)   PRN  Meds:.sodium chloride, acetaminophen, chlorpheniramine-HYDROcodone, guaiFENesin-dextromethorphan, prochlorperazine, valACYclovir No Known Allergies Review of Systems  Unable to perform ROS: Acuity of condition    Physical Exam Constitutional:      General: He is not in acute distress.    Appearance: He is obese. He is ill-appearing.  Cardiovascular:     Rate and Rhythm: Tachycardia present.  Pulmonary:     Effort: No tachypnea, accessory muscle usage or respiratory distress.  Abdominal:     Palpations: Abdomen is soft.  Skin:    General: Skin is warm.     Coloration: Skin is pale.     Comments: Concern for RLE larger than LLE  Neurological:     Mental Status: He is lethargic.     Comments: Hyperthermic 104 F     Vital Signs: BP (!) 141/61   Pulse (!) 110   Temp (!) 104.5 F (40.3 C) (Rectal)   Resp (!) 21   Ht 6' (1.829 m)   Wt (!) 140.5 kg   SpO2 (!) 88%   BMI 42.01 kg/m  Pain Scale: 0-10   Pain Score: 0-No pain   SpO2: SpO2: (!) 88 % O2 Device:SpO2: (!) 88 % O2 Flow Rate: .O2 Flow Rate (L/min): 14 L/min  IO: Intake/output summary:   Intake/Output Summary (Last 24 hours) at 04/25/2020 1655 Last data filed at 04/25/2020 1632 Gross per 24 hour  Intake -  Output 2400 ml  Net -2400 ml    LBM: Last BM Date: 04/24/20 Baseline Weight: Weight: (!) 140.5 kg Most recent weight: Weight: (!) 140.5 kg     Palliative Assessment/Data:     Time In: 1600 Time Out: 1650 Time Total: 50 min Greater than 50%  of this time was spent counseling and coordinating care related to the above assessment and plan.  Signed by: Vinie Sill, NP Palliative Medicine Team Pager # [phone] (M-F 8a-5p) Team Phone # [phone] (Nights/Weekends)

## 2020-04-25 NOTE — Progress Notes (Signed)
Was informed that patient has been refusing CPAP and that there is not unit in the room.  Double checked this.  Will continue to monitor.

## 2020-04-25 NOTE — Progress Notes (Signed)
TR[facility] night shift.  The nursing staff reports that the patient has a fever of 102 F. He is currently in the stepdown unit due to COVID-19 pneumonia on NRB mask at 15 LPM and HFNC also at 15 LPM.  His O2 sat  during the shift has been in the mid to high 90s. He weights 140.5 kg with a BMI of 42.0 kg/m. Given his volume of distribution, I will increase acetaminophen to 650 mg every 4 hours as needed.  Tennis Must, MD.

## 2020-04-25 NOTE — Progress Notes (Signed)
PROGRESS NOTE    [name]  [name] DOB: Feb 19, 1951 DOA: 04/09/2020 PCP: Benita Stabile, MD   Chief Complaint  Patient presents with  . Shortness of Breath    Brief Narrative:  Tim Duncan is a 70 y.o. male with medical history significant of hypertension, BPH, atrial fibrillation (not chronically on anticoagulation), chronic lymphocytic leukemia (actively follow by oncology service at [facility]), hyperlipidemia, morbid obesity and diabetes; who presented to the emergency department secondary to general malaise, shortness of breath and intermittent coughing spells.  Patient symptoms have been present for the last 2 days or so and was tested and found to be positive for COVID.  He is a skilled nursing facility oxygen saturation on room air was around 75%.  Patient reports chronic diarrhea, no nausea or vomiting.  Expressed having chills.  Nonproductive coughing spells and shortness of breath at rest on exertion.  No hemoptysis. Of note, patient expressed to be vaccinated with Laural Benes & Laural Benes vaccine in the past.   ED Course: Inflammatory markers corroborating acute COVID infection; bilateral infiltrates appreciated on all his lungs characteristic for COVID.  IV steroids and remdesivir's initiated.  [facility] has been contacted to place patient in the hospital for acute respiratory failure with hypoxia in the setting of COVID-pneumonia.  Patient is requiring 4 L nasal cannula supplementation.   Assessment & Plan: 1-continue to wean off oxygen supplementation as tolerated -Continue to follow inflammatory markers -Completed remdesivir infusion 04/24/20. -Continue baricitinib -Continue the use of IV steroids -Continue to follow vitamin C and zinc -Will continue as needed bronchodilator, proning position, incentive spirometer and flutter valve -Follow clinical response and continue supportive care. -Continue as needed antitussive medications. -Patient will remain in the stepdown  unit; in case he ended requiring heated high flow oxygen supplementation.  2-history of paroxysmal atrial fibrillation -Sinus rhythm and rate controlled -Continue amiodarone and beta-blocker.  3-CLL -WBCs in the 80s range currently -Continue to follow trend -Case discussed with oncology service (Dr. Ellin Saba) and recommendations given to call venetoclax while acutely ill.  4-type 2 diabetes -Continue sliding scale insulin and Levemir -Follow CBGs and adjust hypoglycemic regimen as needed.  5-gastroesophageal reflux disease/GI prophylaxis -Continue PPI.  6-essential hypertension -Currently stable and well-controlled -Followed by -Continue adjusted dose of antihypertensive regimen.  7-acute kidney injury -In the setting of dehydration and prerenal azotemia -UA not demonstrating acute infection. -Continue to minimize the use of nephrotoxic agents and avoid hypotension -Maintain adequate hydration -Renal function improving appropriately.  8-hyperlipidemia -Continue statins.  9-history of BPH -Continue Flomax.  10-morbid obesity -Body mass index is 42.01 kg/m. -Low-calorie diet, portion control increase physical activity discussed with patient.  11-high-grade temperature -Concern for aspiration pneumonia -Consolidation right upper lung newly appreciated more organized -Blood cultures has been ordered -Patient has been started on Zosyn -Continue supportive care.  EEG  DVT prophylaxis: Lovenox Code Status: Full code. Family Communication: No family member at bedside; patient will update then over the phone by himself. Disposition:   Status is: Inpatient  Dispo: The patient is from: Skilled nursing facility              Anticipated d/c is to: Skilled nursing facility              Anticipated d/c date is: To be determined              Patient currently rates no medically stable for discharge; still requiring oxygen supplementation.  Complaining of feeling short  winded and receiving  IV therapy.  Requiring 15 L nasal cannula supplementation and nonrebreather currently.  Now experiencing high-grade temperature and is having next rate with new right upper lobe consolidation.    Consultants:   Oncology service curbside: Dr. Delton Coombes.  Palliative care   Procedures: See below for x-ray reports  Antimicrobials:/antiviral -Remdesivir 5/5  Subjective: High-grade temperature appreciated; continue to have difficulty with breathing and requiring nonrebreather and 15 L high flow nasal cannula.  No chest pain, no nausea vomiting.   Objective: Vitals:   04/25/20 1134 04/25/20 1200 04/25/20 1328 04/25/20 1544  BP:  130/64    Pulse: 96 99  (!) 110  Resp: (!) 24 (!) 31  (!) 21  Temp: (!) 103.1 F (39.5 C)   (!) 104.9 F (40.5 C)  TempSrc: Oral   Rectal  SpO2: (!) 87% (!) 88% 91% (!) 89%  Weight:      Height:        Intake/Output Summary (Last 24 hours) at 04/25/2020 1604 Last data filed at 04/25/2020 0945 Gross per 24 hour  Intake -  Output 2050 ml  Net -2050 ml   Filed Weights   30-Apr-2020 0656  Weight: (!) 140.5 kg    Examination: General exam: Alert, awake, oriented x 3; continued spiking fever, having difficulty speaking in full sentences and requiring nonrebreather and 15 L high flow nasal cannula.  Expressed having some intermittent productive cough. Respiratory system: Positive rhonchi bilaterally.  No wheezing or crackles.  Positive appreciated tachypnea. Cardiovascular system: Rate controlled, no rubs, no gallops, unable to properly assess JVD with body habitus.   Gastrointestinal system: Abdomen is obese, positive nonincarcerated ventral hernia, nondistended, soft and nontender. No organomegaly or masses felt. Normal bowel sounds heard. Central nervous system: Alert and oriented. No focal neurological deficits. Extremities: No cyanosis or clubbing. Skin: No rashes, no petechiae. Psychiatry: Judgement and insight appear normal.  Mood & affect appropriate.    Data Reviewed: I have personally reviewed following labs and imaging studies  CBC: Recent Labs  Lab 04/21/20 0725 04/22/20 0913 04/23/20 0730 04/24/20 0454 04/25/20 0352  WBC 54.9* 78.8* 80.4* 86.4* 67.4*  NEUTROABS 3.3 7.9* 5.6 8.6* 4.8  HGB 10.7* 10.4* 9.5* 9.5* 9.3*  HCT 32.9* 34.2* 31.1* 31.6* 31.0*  MCV 101.2* 104.0* 102.0* 103.6* 103.3*  PLT 157 157 150 160 139*    Basic Metabolic Panel: Recent Labs  Lab 04/21/20 0725 04/22/20 0913 04/23/20 0730 04/24/20 0454 04/25/20 0352  NA 139 140 138 140 142  K 4.3 4.1 4.1 4.2 4.2  CL 109 109 107 106 106  CO2 21* 23 23 24 27   GLUCOSE 253* 209* 218* 143* 147*  BUN 24* 24* 20 20 16   CREATININE 1.10 1.04 1.01 1.09 1.04  CALCIUM 7.5* 7.2* 7.1* 7.3* 7.1*  MG 2.3 2.1 2.2 2.6* 2.5*  PHOS 3.7 2.6 2.9 2.8 3.4    GFR: Estimated Creatinine Clearance: 98.8 mL/min (by C-G formula based on SCr of 1.04 mg/dL).  Liver Function Tests: Recent Labs  Lab 04/21/20 0725 04/22/20 0913 04/23/20 0730 04/24/20 0454 04/25/20 0352  AST 41 26 22 22 28   ALT 35 27 23 21 21   ALKPHOS 50 44 38 43 45  BILITOT 0.2* 0.3 0.8 0.7 0.9  PROT 5.9* 5.6* 5.6* 5.7* 5.5*  ALBUMIN 2.9* 2.7* 2.6* 2.4* 2.2*    CBG: Recent Labs  Lab 04/24/20 1716 04/24/20 2212 04/25/20 0738 04/25/20 1133 04/25/20 1543  GLUCAP 124* 137* 126* 170* 145*     Recent Results (from the past  240 hour(s))  Blood Culture (routine x 2)     Status: None   Collection Time: 2020-05-10  8:02 AM   Specimen: BLOOD RIGHT HAND  Result Value Ref Range Status   Specimen Description BLOOD RIGHT HAND  Final   Special Requests   Final    BOTTLES DRAWN AEROBIC AND ANAEROBIC Blood Culture adequate volume   Culture   Final    NO GROWTH 5 DAYS Performed at [facility], [address], [place], [place] [address]    Report Status 04/25/2020 FINAL  Final  Resp Panel by RT-PCR (Flu A&B, Covid) Nasopharyngeal Swab     Status: Abnormal   Collection Time:  05-10-20  8:05 AM   Specimen: Nasopharyngeal Swab; Nasopharyngeal(NP) swabs in vial transport medium  Result Value Ref Range Status   SARS Coronavirus 2 by RT PCR POSITIVE (A) NEGATIVE Final    Comment: RESULT CALLED TO, READ BACK BY AND VERIFIED WITH: MARTIN,D. RN @1058  2020-05-10 BILLINGSLEY,L (NOTE) SARS-CoV-2 target nucleic acids are DETECTED.  The SARS-CoV-2 RNA is generally detectable in upper respiratory specimens during the acute phase of infection. Positive results are indicative of the presence of the identified virus, but do not rule out bacterial infection or co-infection with other pathogens not detected by the test. Clinical correlation with patient history and other diagnostic information is necessary to determine patient infection status. The expected result is Negative.  Fact Sheet for Patients: EntrepreneurPulse.com.au  Fact Sheet for Healthcare Providers: IncredibleEmployment.be  This test is not yet approved or cleared by the [place] FDA and  has been authorized for detection and/or diagnosis of SARS-CoV-2 by FDA under an Emergency Use Authorization (EUA).  This EUA will remain in effect (meaning this test  can be used) for the duration of  the COVID-19 declaration under Section 564(b)(1) of the Act, 21 U.S.C. section 360bbb-3(b)(1), unless the authorization is terminated or revoked sooner.     Influenza A by PCR NEGATIVE NEGATIVE Final   Influenza B by PCR NEGATIVE NEGATIVE Final    Comment: (NOTE) The Xpert Xpress SARS-CoV-2/FLU/RSV plus assay is intended as an aid in the diagnosis of influenza from Nasopharyngeal swab specimens and should not be used as a sole basis for treatment. Nasal washings and aspirates are unacceptable for Xpert Xpress SARS-CoV-2/FLU/RSV testing.  Fact Sheet for Patients: EntrepreneurPulse.com.au  Fact Sheet for Healthcare  Providers: IncredibleEmployment.be  This test is not yet approved or cleared by the [place] FDA and has been authorized for detection and/or diagnosis of SARS-CoV-2 by FDA under an Emergency Use Authorization (EUA). This EUA will remain in effect (meaning this test can be used) for the duration of the COVID-19 declaration under Section 564(b)(1) of the Act, 21 U.S.C. section 360bbb-3(b)(1), unless the authorization is terminated or revoked.  Performed at [facility], [address] [place], [place] [address]   Blood Culture (routine x 2)     Status: None (Preliminary result)   Collection Time: 05/10/20  9:04 AM   Specimen: BLOOD  Result Value Ref Range Status   Specimen Description BLOOD LEFT ANTECUBITAL  Final   Special Requests   Final    BOTTLES DRAWN AEROBIC AND ANAEROBIC Blood Culture adequate volume Performed at [facility], [address] [place], [place] [address]    Culture PENDING  Incomplete   Report Status PENDING  Incomplete  Culture, blood (Routine X 2) w Reflex to ID Panel     Status: None (Preliminary result)   Collection Time: 04/25/20 12:46 PM  Specimen: BLOOD  Result Value Ref Range Status   Specimen Description BLOOD LEFT ANTECUBITAL  Final   Special Requests   Final    Blood Culture adequate volume BOTTLES DRAWN AEROBIC AND ANAEROBIC   Culture   Final    NO GROWTH < 12 HOURS Performed at [facility], [address] [place], [place] [address]    Report Status PENDING  Incomplete  Culture, blood (Routine X 2) w Reflex to ID Panel     Status: None (Preliminary result)   Collection Time: 04/25/20 12:47 PM   Specimen: BLOOD  Result Value Ref Range Status   Specimen Description BLOOD LEFT ANTECUBITAL  Final   Special Requests   Final    BOTTLES DRAWN AEROBIC AND ANAEROBIC Blood Culture results may not be optimal due to an inadequate volume of blood received in culture bottles   Culture   Final    NO GROWTH < 12 HOURS Performed at  [facility], [address], [place], [place] [address]    Report Status PENDING  Incomplete     Radiology Studies: DG CHEST PORT 1 VIEW  Result Date: 04/25/2020 CLINICAL DATA:  COVID-19 positivity with shortness of breath and fevers EXAM: PORTABLE CHEST 1 VIEW COMPARISON:  04/29/2020 FINDINGS: Cardiac shadow is stable. Increasing consolidation in the right upper lobe is noted. Patchy airspace opacities are noted bilaterally consistent with the given clinical history. No acute bony abnormality is seen. IMPRESSION: Increasing consolidation in the right upper lobe when compared with the prior study. Electronically Signed   By: Inez Catalina M.D.   On: 04/25/2020 12:25    Scheduled Meds: . allopurinol  300 mg Oral Daily  . amiodarone  200 mg Oral Daily  . vitamin C  500 mg Oral Daily  . aspirin EC  81 mg Oral Daily  . atorvastatin  80 mg Oral Daily  . baricitinib  4 mg Oral Daily  . benzonatate  200 mg Oral TID  . Chlorhexidine Gluconate Cloth  6 each Topical Daily  . enoxaparin (LOVENOX) injection  70 mg Subcutaneous Q24H  . insulin aspart  0-5 Units Subcutaneous QHS  . insulin aspart  0-9 Units Subcutaneous TID WC  . insulin detemir  0.12 Units/kg Subcutaneous BID  . Ipratropium-Albuterol  1 puff Inhalation TID  . metoprolol tartrate  12.5 mg Oral BID  . multivitamin with minerals  1 tablet Oral Daily  . pantoprazole  40 mg Oral Daily  . tamsulosin  0.4 mg Oral QPC supper  . trimethoprim-polymyxin b  1 drop Both Eyes Q6H  . zinc sulfate  220 mg Oral Daily   Continuous Infusions: . sodium chloride 1,000 mL (04/22/20 2037)  . piperacillin-tazobactam (ZOSYN)  IV       LOS: 5 days    Time spent: 35 minutes    Barton Dubois, MD [facility]   To contact the attending provider between 7A-7P or the covering provider during after hours 7P-7A, please log into the web site [email or web address] and access using universal [place] password for that web site. If you do not have the  password, please call the hospital operator.  04/25/2020, 4:04 PM   You

## 2020-04-26 DIAGNOSIS — J9621 Acute and chronic respiratory failure with hypoxia: Secondary | ICD-10-CM | POA: Diagnosis not present

## 2020-04-26 LAB — BLOOD CULTURE ID PANEL (REFLEXED) - BCID2

## 2020-04-26 LAB — PROCALCITONIN: Procalcitonin: 8.91 ng/mL

## 2020-04-26 LAB — GLUCOSE, CAPILLARY
Glucose-Capillary: 126 mg/dL — ABNORMAL HIGH (ref 70–99)
Glucose-Capillary: 135 mg/dL — ABNORMAL HIGH (ref 70–99)
Glucose-Capillary: 83 mg/dL (ref 70–99)
Glucose-Capillary: 119 mg/dL — ABNORMAL HIGH (ref 70–99)

## 2020-04-26 LAB — CULTURE, BLOOD (ROUTINE X 2)
Culture: NO GROWTH
Special Requests: ADEQUATE

## 2020-04-26 LAB — TSH: TSH: 2.697 u[IU]/mL (ref 0.350–4.500)

## 2020-04-26 LAB — MRSA PCR SCREENING: MRSA by PCR: NEGATIVE

## 2020-04-26 LAB — AMMONIA: Ammonia: 47 umol/L — ABNORMAL HIGH (ref 9–35)

## 2020-04-26 MED ORDER — SODIUM CHLORIDE 0.9 % IV SOLN
2.0000 g | INTRAVENOUS | Status: DC
  Administered 2020-04-26 – 2020-05-02 (×7): 2 g via INTRAVENOUS
  Filled 2020-04-26 (×4): qty 20

## 2020-04-26 NOTE — Clinical Social Work Note (Signed)
Message left for [name] at [facility].   Alfhild Partch, Clydene Pugh, LCSW

## 2020-04-26 NOTE — Progress Notes (Signed)
PROGRESS NOTE    Tim Duncan  YNW:[id number] DOB: 1950-05-04 DOA: 04/07/2020 PCP: Celene Squibb, MD   Brief Narrative:  Tim Brinkmeier Moyeris a 70 y.o.malewith medical history significant ofhypertension, BPH, atrial fibrillation (not chronically on anticoagulation), chronic lymphocytic leukemia (actively follow by oncology service at [facility]), hyperlipidemia, morbid obesity and diabetes; who presented to the emergency department secondary to general malaise, shortness of breath and intermittent coughing spells. Patient symptoms have been present for the last 2 days or so and was tested and found to be positive for COVID. He is a skilled nursing facility oxygen saturation on room air was around 75%. Patient reports chronic diarrhea, no nausea or vomiting. Expressed having chills. Nonproductive coughing spells and shortness of breath at rest on exertion. No hemoptysis. Of note, patient expressed to be vaccinated with Wynetta Emery & Wynetta Emery vaccine in the past.   ED Course:Inflammatory markers corroborating acute COVID infection; bilateral infiltrates appreciated on all his lungs characteristic for COVID. IV steroids and remdesivir's initiated. [facility] has been contacted to place patient in the hospital for acute respiratory failure with hypoxia in the setting of COVID-pneumonia. Patient is requiring 4 L nasal cannula supplementation.   Assessment & Plan:   Active Problems:   Acute on chronic respiratory failure with hypoxia (HCC)   1-continue to wean off oxygen supplementation as tolerated -Continue to follow inflammatory markers -Completed remdesivir infusion 04/24/20. -Continue baricitinib -Continue the use of IV steroids -Continue to follow vitamin C and zinc -Will continue as needed bronchodilator, proning position, incentive spirometer and flutter valve -Follow clinical response and continue supportive care. -Continue as needed antitussive medications. -Patient will remain in  the stepdown unit; in case he ended requiring heated high flow oxygen supplementation. -Plan to check ammonia levels and TSH given some mild encephalopathy -Strep pneumonia noted on cultures for which patient is on Rocephin now  2-history of paroxysmal atrial fibrillation -Sinus rhythm and rate controlled -Continue amiodarone and beta-blocker.  3-CLL -WBCs in the 60s range currently -Continue to follow trend -Case discussed with oncology service (Dr. Delton Coombes) and recommendations given to hold venetoclax while acutely ill; may resume on dc  4-type 2 diabetes -Continue sliding scale insulin and Levemir -Follow CBGs and adjust hypoglycemic regimen as needed.  5-gastroesophageal reflux disease/GI prophylaxis -Continue PPI.  6-essential hypertension -Currently stable and well-controlled -Followed by -Continue adjusted dose of antihypertensive regimen.  7-acute kidney injury -In the setting of dehydration and prerenal azotemia -UA not demonstrating acute infection. -Continue to minimize the use of nephrotoxic agents and avoid hypotension -Maintain adequate hydration -Renal function improving appropriately.  8-hyperlipidemia -Continue statins.  9-history of BPH -Continue Flomax.  10-morbid obesity -Body mass index is 42.01 kg/m. -Low-calorie diet, portion control increase physical activity discussed with patient.  11-high-grade temperature -Concern for aspiration pneumonia -Consolidation right upper lung newly appreciated more organized -Blood cultures has been ordered -Patient has been started on Zosyn -Continue supportive care.  EEG  DVT prophylaxis: Lovenox Code Status: Full code. Family Communication:  Discussed with friend who is the power of attorney on phone 1/26 Disposition:   Status is: Inpatient  Dispo: The patient is from: Skilled nursing facility  Anticipated d/c is to: Skilled nursing facility  Anticipated d/c  date is: To be determined  Patient currently rates no medically stable for discharge; still requiring oxygen supplementation.  Complaining of feeling short winded and receiving IV therapy.  Requiring 15 L nasal cannula supplementation and nonrebreather currently.  Now experiencing high-grade temperature and is having next rate  with new right upper lobe consolidation.    Consultants:   Oncology service curbside: Dr. Delton Coombes.  Palliative care   Procedures: See below for x-ray reports  Antimicrobials:/antiviral Anti-infectives (From admission, onward)   Start     Dose/Rate Route Frequency Ordered Stop   04/26/20 1200  cefTRIAXone (ROCEPHIN) 2 g in sodium chloride 0.9 % 100 mL IVPB        2 g 200 mL/hr over 30 Minutes Intravenous Every 24 hours 04/26/20 0848     04/25/20 1630  piperacillin-tazobactam (ZOSYN) IVPB 3.375 g  Status:  Discontinued        3.375 g 12.5 mL/hr over 240 Minutes Intravenous Every 8 hours 04/25/20 1551 04/26/20 0848   04/21/20 1000  remdesivir 100 mg in sodium chloride 0.9 % 100 mL IVPB        100 mg 200 mL/hr over 30 Minutes Intravenous Daily 04/10/2020 1108 04/24/20 1835   04/21/20 1000  remdesivir 100 mg in sodium chloride 0.9 % 100 mL IVPB  Status:  Discontinued       "Followed by" Linked Group Details   100 mg 200 mL/hr over 30 Minutes Intravenous Daily 04/13/2020 1139 04/30/2020 1147   04/28/2020 1200  remdesivir 100 mg in sodium chloride 0.9 % 100 mL IVPB        100 mg 200 mL/hr over 30 Minutes Intravenous Every 1 hr x 2 04/27/2020 1108 04/12/2020 1315   04/22/2020 1145  remdesivir 200 mg in sodium chloride 0.9% 250 mL IVPB  Status:  Discontinued       "Followed by" Linked Group Details   200 mg 580 mL/hr over 30 Minutes Intravenous Once 04/18/2020 1139 04/08/2020 1147   04/28/2020 1141  valACYclovir (VALTREX) tablet 1,000 mg        1,000 mg Oral Daily PRN 04/21/2020 1142         Subjective: Patient seen and evaluated today and appears quite fatigued  and somnolent.  Objective: Vitals:   04/26/20 0551 04/26/20 0745 04/26/20 1043 04/26/20 1333  BP:   (!) 119/34   Pulse:   99   Resp:   (!) 27   Temp: 98.9 F (37.2 C) 98.3 F (36.8 C) 100.2 F (37.9 C)   TempSrc: Rectal Oral Rectal   SpO2:  (!) 89% (!) 88% (!) 87%  Weight:      Height:        Intake/Output Summary (Last 24 hours) at 04/26/2020 1354 Last data filed at 04/26/2020 1323 Gross per 24 hour  Intake 372.58 ml  Output 800 ml  Net -427.42 ml   Filed Weights   04/13/2020 0656  Weight: (!) 140.5 kg    Examination:  General exam: Appears calm and comfortable obese Respiratory system: Clear to auscultation. Respiratory effort normal.  Currently on 40 L high flow nasal cannula oxygen Cardiovascular system: S1 & S2 heard, RRR.  Gastrointestinal system: Abdomen is soft Central nervous system: Somnolent but arousable Extremities: No edema Skin: No significant lesions noted Psychiatry: Flat affect.    Data Reviewed: I have personally reviewed following labs and imaging studies  CBC: Recent Labs  Lab 04/21/20 0725 04/22/20 0913 04/23/20 0730 04/24/20 0454 04/25/20 0352  WBC 54.9* 78.8* 80.4* 86.4* 67.4*  NEUTROABS 3.3 7.9* 5.6 8.6* 4.8  HGB 10.7* 10.4* 9.5* 9.5* 9.3*  HCT 32.9* 34.2* 31.1* 31.6* 31.0*  MCV 101.2* 104.0* 102.0* 103.6* 103.3*  PLT 157 157 150 160 [id number]*   Basic Metabolic Panel: Recent Labs  Lab 04/21/20 0725 04/22/20 0913 04/23/20  0730 04/24/20 0454 04/25/20 0352  NA 139 140 138 140 142  K 4.3 4.1 4.1 4.2 4.2  CL 109 109 107 106 106  CO2 21* 23 23 24 27   GLUCOSE 253* 209* 218* 143* 147*  BUN 24* 24* 20 20 16   CREATININE 1.10 1.04 1.01 1.09 1.04  CALCIUM 7.5* 7.2* 7.1* 7.3* 7.1*  MG 2.3 2.1 2.2 2.6* 2.5*  PHOS 3.7 2.6 2.9 2.8 3.4   GFR: Estimated Creatinine Clearance: 98.8 mL/min (by C-G formula based on SCr of 1.04 mg/dL). Liver Function Tests: Recent Labs  Lab 04/21/20 0725 04/22/20 0913 04/23/20 0730 04/24/20 0454  04/25/20 0352  AST 41 26 22 22 28   ALT 35 27 23 21 21   ALKPHOS 50 44 38 43 45  BILITOT 0.2* 0.3 0.8 0.7 0.9  PROT 5.9* 5.6* 5.6* 5.7* 5.5*  ALBUMIN 2.9* 2.7* 2.6* 2.4* 2.2*   No results for input(s): LIPASE, AMYLASE in the last 168 hours. No results for input(s): AMMONIA in the last 168 hours. Coagulation Profile: No results for input(s): INR, PROTIME in the last 168 hours. Cardiac Enzymes: No results for input(s): CKTOTAL, CKMB, CKMBINDEX, TROPONINI in the last 168 hours. BNP (last 3 results) No results for input(s): PROBNP in the last 8760 hours. HbA1C: No results for input(s): HGBA1C in the last 72 hours. CBG: Recent Labs  Lab 04/25/20 1133 04/25/20 1543 04/25/20 2137 04/26/20 0806 04/26/20 1125  GLUCAP 170* 145* 146* 126* 135*   Lipid Profile: No results for input(s): CHOL, HDL, LDLCALC, TRIG, CHOLHDL, LDLDIRECT in the last 72 hours. Thyroid Function Tests: No results for input(s): TSH, T4TOTAL, FREET4, T3FREE, THYROIDAB in the last 72 hours. Anemia Panel: Recent Labs    04/24/20 0454 04/25/20 0352  FERRITIN 1,241* 1,481*   Sepsis Labs: Recent Labs  Lab 04/12/2020 0802 05/01/2020 1048 04/25/20 1246  PROCALCITON <0.10  --  4.09  LATICACIDVEN 1.1 0.9  --     Recent Results (from the past 240 hour(s))  Blood Culture (routine x 2)     Status: None   Collection Time: 04/09/2020  8:02 AM   Specimen: BLOOD RIGHT HAND  Result Value Ref Range Status   Specimen Description BLOOD RIGHT HAND  Final   Special Requests   Final    BOTTLES DRAWN AEROBIC AND ANAEROBIC Blood Culture adequate volume   Culture   Final    NO GROWTH 5 DAYS Performed at [facility], [address] [place], [place] [address]    Report Status 04/25/2020 FINAL  Final  Resp Panel by RT-PCR (Flu A&B, Covid) Nasopharyngeal Swab     Status: Abnormal   Collection Time: 05/01/2020  8:05 AM   Specimen: Nasopharyngeal Swab; Nasopharyngeal(NP) swabs in vial transport medium  Result Value Ref Range Status    SARS Coronavirus 2 by RT PCR POSITIVE (A) NEGATIVE Final    Comment: RESULT CALLED TO, READ BACK BY AND VERIFIED WITH: MARTIN,D. RN @1058  04/24/2020 BILLINGSLEY,L (NOTE) SARS-CoV-2 target nucleic acids are DETECTED.  The SARS-CoV-2 RNA is generally detectable in upper respiratory specimens during the acute phase of infection. Positive results are indicative of the presence of the identified virus, but do not rule out bacterial infection or co-infection with other pathogens not detected by the test. Clinical correlation with patient history and other diagnostic information is necessary to determine patient infection status. The expected result is Negative.  Fact Sheet for Patients: EntrepreneurPulse.com.au  Fact Sheet for Healthcare Providers: IncredibleEmployment.be  This test is not yet approved or cleared by  the [employer] and  has been authorized for detection and/or diagnosis of SARS-CoV-2 by FDA under an Emergency Use Authorization (EUA).  This EUA will remain in effect (meaning this test  can be used) for the duration of  the COVID-19 declaration under Section 564(b)(1) of the Act, 21 U.S.C. section 360bbb-3(b)(1), unless the authorization is terminated or revoked sooner.     Influenza A by PCR NEGATIVE NEGATIVE Final   Influenza B by PCR NEGATIVE NEGATIVE Final    Comment: (NOTE) The Xpert Xpress SARS-CoV-2/FLU/RSV plus assay is intended as an aid in the diagnosis of influenza from Nasopharyngeal swab specimens and should not be used as a sole basis for treatment. Nasal washings and aspirates are unacceptable for Xpert Xpress SARS-CoV-2/FLU/RSV testing.  Fact Sheet for Patients: EntrepreneurPulse.com.au  Fact Sheet for Healthcare Providers: IncredibleEmployment.be  This test is not yet approved or cleared by the [place] FDA and has been authorized for detection and/or diagnosis of  SARS-CoV-2 by FDA under an Emergency Use Authorization (EUA). This EUA will remain in effect (meaning this test can be used) for the duration of the COVID-19 declaration under Section 564(b)(1) of the Act, 21 U.S.C. section 360bbb-3(b)(1), unless the authorization is terminated or revoked.  Performed at [facility], [address], [place], [place] [address]   Blood Culture (routine x 2)     Status: None   Collection Time: 04/06/2020  9:04 AM   Specimen: BLOOD  Result Value Ref Range Status   Specimen Description BLOOD LEFT ANTECUBITAL  Final   Special Requests   Final    BOTTLES DRAWN AEROBIC AND ANAEROBIC Blood Culture adequate volume   Culture   Final    NO GROWTH 6 DAYS Performed at [facility], [address], [place], [place] [address]    Report Status 04/26/2020 FINAL  Final  Culture, blood (Routine X 2) w Reflex to ID Panel     Status: None (Preliminary result)   Collection Time: 04/25/20 12:46 PM   Specimen: BLOOD  Result Value Ref Range Status   Specimen Description   Final    BLOOD LEFT ANTECUBITAL Performed at [facility] [address] [place], [place] [address]    Special Requests   Final    Blood Culture adequate volume BOTTLES DRAWN AEROBIC AND ANAEROBIC Performed at [facility], [address] [place], [place] [address]    Culture  Setup Time   Final    IN BOTH AEROBIC AND ANAEROBIC BOTTLES GRAM POSITIVE COCCI IN PAIRS Gram Stain Report Called to,Read Back By and Verified With: J HEARN,RN@0016  04/26/20 MKELLY CRITICAL VALUE NOTED.  VALUE IS CONSISTENT WITH PREVIOUSLY REPORTED AND CALLED VALUE. Performed at [facility], [place] [address] [place], [place] [address]    Culture GRAM POSITIVE COCCI  Final   Report Status PENDING  Incomplete  Culture, blood (Routine X 2) w Reflex to ID Panel     Status: None (Preliminary result)   Collection Time: 04/25/20 12:47 PM   Specimen: BLOOD  Result Value Ref Range Status   Specimen Description   Final    BLOOD  LEFT ANTECUBITAL Performed at [facility], [address], [place], [place] [address]    Special Requests   Final    BOTTLES DRAWN AEROBIC AND ANAEROBIC Blood Culture results may not be optimal due to an inadequate volume of blood received in culture bottles Performed at [facility], [address], [place], [place] [address]    Culture  Setup Time  Final    AEROBIC BOTTLE ONLY GRAM POSITIVE COCCI IN PAIRS Gram Stain Report Called to,Read Back By and Verified With: J HEARN,RN 0016 04/26/20 MKELLY CRITICAL RESULT CALLED TO, READ BACK BY AND VERIFIED WITH: J,HEARN RN @0504  04/26/20 EB Performed at [facility], [place] [address] [place], [place] [address]    Culture GRAM POSITIVE COCCI  Final   Report Status PENDING  Incomplete  Blood Culture ID Panel (Reflexed)     Status: Abnormal   Collection Time: 04/25/20 12:47 PM  Result Value Ref Range Status   Enterococcus faecalis NOT DETECTED NOT DETECTED Final   Enterococcus Faecium NOT DETECTED NOT DETECTED Final   Listeria monocytogenes NOT DETECTED NOT DETECTED Final   Staphylococcus species NOT DETECTED NOT DETECTED Final   Staphylococcus aureus (BCID) NOT DETECTED NOT DETECTED Final   Staphylococcus epidermidis NOT DETECTED NOT DETECTED Final   Staphylococcus lugdunensis NOT DETECTED NOT DETECTED Final   Streptococcus species DETECTED (A) NOT DETECTED Final    Comment: CRITICAL RESULT CALLED TO, READ BACK BY AND VERIFIED WITH: J,HEARN RN @0504  04/26/20 EB    Streptococcus agalactiae NOT DETECTED NOT DETECTED Final   Streptococcus pneumoniae DETECTED (A) NOT DETECTED Final    Comment: CRITICAL RESULT CALLED TO, READ BACK BY AND VERIFIED WITH: J,HEARN RN @0504  04/26/20 EB    Streptococcus pyogenes NOT DETECTED NOT DETECTED Final   A.calcoaceticus-baumannii NOT DETECTED NOT DETECTED Final   Bacteroides fragilis NOT DETECTED NOT DETECTED Final   Enterobacterales NOT DETECTED NOT DETECTED Final   Enterobacter cloacae complex NOT  DETECTED NOT DETECTED Final   Escherichia coli NOT DETECTED NOT DETECTED Final   Klebsiella aerogenes NOT DETECTED NOT DETECTED Final   Klebsiella oxytoca NOT DETECTED NOT DETECTED Final   Klebsiella pneumoniae NOT DETECTED NOT DETECTED Final   Proteus species NOT DETECTED NOT DETECTED Final   Salmonella species NOT DETECTED NOT DETECTED Final   Serratia marcescens NOT DETECTED NOT DETECTED Final   Haemophilus influenzae NOT DETECTED NOT DETECTED Final   Neisseria meningitidis NOT DETECTED NOT DETECTED Final   Pseudomonas aeruginosa NOT DETECTED NOT DETECTED Final   Stenotrophomonas maltophilia NOT DETECTED NOT DETECTED Final   Candida albicans NOT DETECTED NOT DETECTED Final   Candida auris NOT DETECTED NOT DETECTED Final   Candida glabrata NOT DETECTED NOT DETECTED Final   Candida krusei NOT DETECTED NOT DETECTED Final   Candida parapsilosis NOT DETECTED NOT DETECTED Final   Candida tropicalis NOT DETECTED NOT DETECTED Final   Cryptococcus neoformans/gattii NOT DETECTED NOT DETECTED Final    Comment: Performed at [facility], [address] [place], [place] [address]  MRSA PCR Screening     Status: None   Collection Time: 04/25/20 10:00 PM   Specimen: Nasal Mucosa; Nasopharyngeal  Result Value Ref Range Status   MRSA by PCR NEGATIVE NEGATIVE Final    Comment:        The GeneXpert MRSA Assay (FDA approved for NASAL specimens only), is one component of a comprehensive MRSA colonization surveillance program. It is not intended to diagnose MRSA infection nor to guide or monitor treatment for MRSA infections. Performed at [facility], [address] [place], [place] [address]          Radiology Studies: DG CHEST PORT 1 VIEW  Result Date: 04/25/2020 CLINICAL DATA:  COVID-19 positivity with shortness of breath and fevers EXAM: PORTABLE CHEST 1 VIEW COMPARISON:  04/29/2020 FINDINGS: Cardiac shadow is stable. Increasing consolidation in the right upper lobe is noted.  Patchy airspace opacities are noted bilaterally consistent with the given clinical history. No acute bony abnormality is seen. IMPRESSION: Increasing consolidation in the right upper lobe when compared with the prior study. Electronically Signed   By: Inez Catalina M.D.   On: 04/25/2020 12:25        Scheduled Meds: . allopurinol  300 mg Oral Daily  . amiodarone  200 mg Oral Daily  . vitamin C  500 mg Oral Daily  . aspirin EC  81 mg Oral Daily  . atorvastatin  80 mg Oral Daily  . benzonatate  200 mg Oral TID  . Chlorhexidine Gluconate Cloth  6 each Topical Daily  . enoxaparin (LOVENOX) injection  70 mg Subcutaneous Q24H  . insulin aspart  0-5 Units Subcutaneous QHS  . insulin aspart  0-9 Units Subcutaneous TID WC  . insulin detemir  0.12 Units/kg Subcutaneous BID  . Ipratropium-Albuterol  1 puff Inhalation TID  . metoprolol tartrate  12.5 mg Oral BID  . multivitamin with minerals  1 tablet Oral Daily  . pantoprazole  40 mg Oral Daily  . tamsulosin  0.4 mg Oral QPC supper  . trimethoprim-polymyxin b  1 drop Both Eyes Q6H  . zinc sulfate  220 mg Oral Daily   Continuous Infusions: . sodium chloride Stopped (04/25/20 1659)  . cefTRIAXone (ROCEPHIN)  IV 2 g (04/26/20 1323)     LOS: 6 days    Time spent: 35 minutes    Aaliyah Cancro D Manuella Ghazi, DO [facility]  If 7PM-7AM, please contact night-coverage [email or web address] 04/26/2020, 1:54 PM

## 2020-04-26 NOTE — TOC Initial Note (Signed)
Transition of Care [facility]) - Initial/Assessment Note    Patient Details  Name: Tim Duncan MRN: [id number] Date of Birth: Jul 20, 1950  Transition of [facility]) CM/SW Contact:    Ihor Gully, LCSW Phone Number: 04/26/2020, 4:39 PM  Clinical Narrative:                 Patient from [place], long term care resident. Admitted COVID+. Considered High risk for readmission. Uses wheelchair. Enjoys television and reading magazines. Completed therapy. Can return to the facility at discharge.   Expected Discharge Plan: Skilled Nursing Facility Barriers to Discharge: Continued Medical Work up   Patient Goals and CMS Choice Patient states their goals for this hospitalization and ongoing recovery are:: return to facility      Expected Discharge Plan and Services Expected Discharge Plan: [place] Acute Care Choice: [place] Living arrangements for the past 2 months: [place]                                      Prior Living Arrangements/Services Living arrangements for the past 2 months: [place]   Patient language and need for interpreter reviewed:: Yes Do you feel safe going back to the place where you live?: Yes      Need for Family Participation in Patient Care: Yes (Comment) Care giver support system in place?: Yes (comment)   Criminal Activity/Legal Involvement Pertinent to Current Situation/Hospitalization: No - Comment as needed  Activities of Daily Living Home Assistive Devices/Equipment: None ADL Screening (condition at time of admission) Patient's cognitive ability adequate to safely complete daily activities?: Yes Is the patient deaf or have difficulty hearing?: No Does the patient have difficulty seeing, even when wearing glasses/contacts?: No Does the patient have difficulty concentrating, remembering, or making decisions?: No Patient able to express need for assistance with ADLs?:  Yes Does the patient have difficulty dressing or bathing?: No Independently performs ADLs?: Yes (appropriate for developmental age) Does the patient have difficulty walking or climbing stairs?: No Weakness of Legs: None Weakness of Arms/Hands: None  Permission Sought/Granted Permission sought to share information with : Family Supports    Share Information with NAME: Billie Lade           Emotional Assessment Appearance:: Appears stated age       Alcohol / Substance Use: Not Applicable    Admission diagnosis:  CLL (chronic lymphocytic leukemia) ([place]) [C91.10] Acute respiratory failure with hypoxia ([place]) [J96.01] Acute on chronic respiratory failure with hypoxia ([place]) [J96.21] Pneumonia due to COVID-19 virus [U07.1, J12.82] Patient Active Problem List   Diagnosis Date Noted  . Acute on chronic respiratory failure with hypoxia ([place]) 04/16/2020  . Hypotension 10/15/2019  . Obesity 10/15/2019  . Arthritis 10/15/2019  . Hypokalemia 10/15/2019  . Hyperglycemia 10/15/2019  . Lactic acidosis 10/15/2019  . DM (diabetes mellitus), type 2 ([place]) 10/15/2019  . Essential hypertension 10/15/2019  . Prolonged QT interval 10/15/2019  . Acute kidney injury (nontraumatic) ([place]) 09/14/2019  . Fall 09/14/2019  . Atrial fibrillation ([place]) 09/14/2019  . B12 deficiency 02/02/2019  . Acute respiratory failure with hypoxia ([place]) 09/11/2016  . Bacteremia 05/14/2016  . Varicose veins of lower extremities with complications 40/98/1191  . Chronic venous insufficiency 06/23/2014  . Multiple contusions 06/23/2014  . Routine adult health maintenance 02/02/2014  . Hyperuricemia 02/02/2014  . Synovial cyst 12/15/2013  .  Impetigo 12/03/2012  . Genital herpes 11/02/2012  . CLL (chronic lymphocytic leukemia) ([place])   . Lymphadenopathy   . Lymphocytosis   . Thrombocytosis   . Anemia 08/26/2012  . Pericardial effusion 07/01/2012  . Disorder of skin or subcutaneous tissue 06/16/2012   PCP:   Celene Squibb, MD Pharmacy:   [place], [place] - [address] [place] [address] [place] [address] [place] [address] Phone: [phone] Fax: [phone]  [place], [place], [place] [address] [place] [address] Phone: [phone] Fax: [place], [place] [address] Phone: [phone] Fax: [phone]  [place], [place], [address] [place], [place] [address] [place] [address] Phone: [phone] Fax: [phone]     Social Determinants of Health (SDOH) Interventions    Readmission Risk Interventions No flowsheet data found.

## 2020-04-27 DIAGNOSIS — J9621 Acute and chronic respiratory failure with hypoxia: Secondary | ICD-10-CM | POA: Diagnosis not present

## 2020-04-27 LAB — COMPREHENSIVE METABOLIC PANEL
ALT: 23 U/L (ref 0–44)
AST: 45 U/L — ABNORMAL HIGH (ref 15–41)
Albumin: 2.1 g/dL — ABNORMAL LOW (ref 3.5–5.0)
Alkaline Phosphatase: 65 U/L (ref 38–126)
Anion gap: 9 (ref 5–15)
BUN: 19 mg/dL (ref 8–23)
CO2: 26 mmol/L (ref 22–32)
Calcium: 7.1 mg/dL — ABNORMAL LOW (ref 8.9–10.3)
Chloride: 106 mmol/L (ref 98–111)
Creatinine, Ser: 1.15 mg/dL (ref 0.61–1.24)
GFR, Estimated: >60 mL/min (ref >60)
Glucose, Bld: 54 mg/dL — ABNORMAL LOW (ref 70–99)
Potassium: 3.2 mmol/L — ABNORMAL LOW (ref 3.5–5.1)
Sodium: 141 mmol/L (ref 135–145)
Total Bilirubin: 0.6 mg/dL (ref 0.3–1.2)
Total Protein: 5.9 g/dL — ABNORMAL LOW (ref 6.5–8.1)

## 2020-04-27 LAB — GLUCOSE, CAPILLARY
Glucose-Capillary: 42 mg/dL — CL (ref 70–99)
Glucose-Capillary: 387 mg/dL — ABNORMAL HIGH (ref 70–99)
Glucose-Capillary: 332 mg/dL — ABNORMAL HIGH (ref 70–99)

## 2020-04-27 LAB — CBC WITH DIFFERENTIAL/PLATELET
Basophils Absolute: 0 10*3/uL (ref 0.0–0.1)
Basophils Relative: 0 %
Blasts: 23 %
Eosinophils Absolute: 0 10*3/uL (ref 0.0–0.5)
Eosinophils Relative: 0 %
HCT: 28.6 % — ABNORMAL LOW (ref 39.0–52.0)
Hemoglobin: 8.3 g/dL — ABNORMAL LOW (ref 13.0–17.0)
Lymphocytes Relative: 62 %
Lymphs Abs: 61.3 10*3/uL — ABNORMAL HIGH (ref 0.7–4.0)
MCH: 30.4 pg (ref 26.0–34.0)
MCHC: 29 g/dL — ABNORMAL LOW (ref 30.0–36.0)
MCV: 104.8 fL — ABNORMAL HIGH (ref 80.0–100.0)
Monocytes Absolute: 0 10*3/uL — ABNORMAL LOW (ref 0.1–1.0)
Monocytes Relative: 0 %
Neutro Abs: 14.8 10*3/uL — ABNORMAL HIGH (ref 1.7–7.7)
Neutrophils Relative %: 15 %
Platelets: 152 10*3/uL (ref 150–400)
RBC: 2.73 MIL/uL — ABNORMAL LOW (ref 4.22–5.81)
RDW: 16.6 % — ABNORMAL HIGH (ref 11.5–15.5)
WBC: 98.9 10*3/uL (ref 4.0–10.5)
nRBC: 0 % (ref 0.0–0.2)

## 2020-04-27 LAB — FERRITIN: Ferritin: 2436 ng/mL — ABNORMAL HIGH (ref 24–336)

## 2020-04-27 LAB — AMMONIA: Ammonia: 54 umol/L — ABNORMAL HIGH (ref 9–35)

## 2020-04-27 LAB — MAGNESIUM: Magnesium: 2.9 mg/dL — ABNORMAL HIGH (ref 1.7–2.4)

## 2020-04-27 LAB — C-REACTIVE PROTEIN: CRP: 39.8 mg/dL — ABNORMAL HIGH (ref <1.0)

## 2020-04-27 LAB — PROCALCITONIN: Procalcitonin: 6.86 ng/mL

## 2020-04-27 MED ORDER — INSULIN DETEMIR 100 UNIT/ML ~~LOC~~ SOLN
15.0000 [IU] | Freq: Two times a day (BID) | SUBCUTANEOUS | Status: DC
  Administered 2020-04-27 – 2020-05-01 (×8): 15 [IU] via SUBCUTANEOUS
  Filled 2020-04-27 (×11): qty 0.15

## 2020-04-27 MED ORDER — METHYLPREDNISOLONE SODIUM SUCC 125 MG IJ SOLR
125.0000 mg | Freq: Two times a day (BID) | INTRAMUSCULAR | Status: DC
  Administered 2020-04-27 – 2020-05-03 (×13): 125 mg via INTRAVENOUS
  Filled 2020-04-27 (×13): qty 2

## 2020-04-27 MED ORDER — POTASSIUM CHLORIDE CRYS ER 20 MEQ PO TBCR
40.0000 meq | EXTENDED_RELEASE_TABLET | Freq: Once | ORAL | Status: AC
  Administered 2020-04-27: 40 meq via ORAL
  Filled 2020-04-27: qty 2

## 2020-04-27 MED ORDER — FAMOTIDINE 20 MG PO TABS
20.0000 mg | ORAL_TABLET | Freq: Two times a day (BID) | ORAL | Status: DC
  Administered 2020-04-27 – 2020-05-01 (×10): 20 mg via ORAL
  Filled 2020-04-27 (×10): qty 1

## 2020-04-27 MED ORDER — LACTULOSE 10 GM/15ML PO SOLN
20.0000 g | Freq: Three times a day (TID) | ORAL | Status: DC
  Administered 2020-04-27 – 2020-05-02 (×16): 20 g via ORAL
  Filled 2020-04-27 (×17): qty 30

## 2020-04-27 MED ORDER — FAMOTIDINE 20 MG PO TABS: 20.0000 mg | ORAL_TABLET | Freq: Every day | ORAL | Status: DC

## 2020-04-27 NOTE — Progress Notes (Addendum)
PROGRESS NOTE    Tim Duncan  EXH:[id number] DOB: Jan 12, 1951 DOA: 2020-05-17 PCP: Celene Squibb, MD   Brief Narrative:  Tim Chew Moyeris a 70 y.o.malewith medical history significant ofhypertension, BPH, atrial fibrillation (not chronically on anticoagulation), chronic lymphocytic leukemia (actively follow by oncology service at [facility]), hyperlipidemia, morbid obesity and diabetes; who presented to the emergency department secondary to general malaise, shortness of breath and intermittent coughing spells. Patient symptoms have been present for the last 2 days or so and was tested and found to be positive for COVID. He is a skilled nursing facility oxygen saturation on room air was around 75%. Patient reports chronic diarrhea, no nausea or vomiting. Expressed having chills. Nonproductive coughing spells and shortness of breath at rest on exertion. No hemoptysis. Of note, patient expressed to be vaccinated with Wynetta Emery & Wynetta Emery vaccine in the past.   ED Course:Inflammatory markers corroborating acute COVID infection; bilateral infiltrates appreciated on all his lungs characteristic for COVID. IV steroids and remdesivir's initiated. [facility] has been contacted to place patient in the hospital for acute respiratory failure with hypoxia in the setting of COVID-pneumonia. Patient is requiring 4 L nasal cannula supplementation.  Assessment & Plan:   Active Problems:   Acute on chronic respiratory failure with hypoxia (HCC)   Acute on chronic hypoxemic respiratory failure -Continue to follow inflammatory markers -Completedremdesivir infusion 04/24/20. -Continue baricitinib -Resume the use of IV steroids, restarted 1/27 -Continue to follow vitamin C and zinc -Will continue as needed bronchodilator, proning position, incentive spirometer and flutter valve -Follow clinical response and continue supportive care. -Continue as needed antitussive medications. -Patient willremain in  the stepdown unit; incase he endedrequiringheated high flow oxygen supplementation.  Acute encephalopathy-multifactorial -Appears to be related to hepatic encephalopathy with some mild elevation in ammonia levels for which lactulose started, follow trend -TSH within normal limits -Likely related to critical illness  Strep pneumonia bacteremia with ongoing fevers -Currently on full dose Rocephin for treatment -Also noted to have gram-negative rods in urine with full ID and sensitivity pending  Ongoing diarrhea -Plan to check GI panel -Check C. Difficile given severe leukocytosis, watery diarrhea, and fevers in setting of antibiotic use  History of paroxysmal atrial fibrillation -Sinus rhythm and rate controlled -Continue amiodarone and beta-blocker.  CLL -WBCs in the 90s range currently -Continue to follow trend -Case discussed with oncology service (Dr. Delton Coombes) and recommendations given to hold venetoclax while acutely ill; may resume on dc  Type 2 diabetes -Continue sliding scale insulin and Levemir -Follow CBGs and adjust hypoglycemic regimen as needed.  Gastroesophageal reflux disease/GI prophylaxis -Change PPI to H2 blocker  Essential hypertension -Currently stable and well-controlled -Followed by -Continue adjusted dose of antihypertensive regimen.  Acute kidney injury-resolved -In the setting of dehydration and prerenal azotemia -UA not demonstrating acute infection. -Continue to minimize the use of nephrotoxic agents and avoid hypotension -Maintain adequate hydration -Renal function stable  Hyperlipidemia -Continue statins.  History of BPH -Continue Flomax.  Morbid obesity -Body mass index is 42.01 kg/m. -Low-calorie diet, portion control increase physical activity discussed with patient.   DVT prophylaxis:Lovenox Code Status:Full code. Family Communication: Discussed with friend who is the power of attorney on phone  1/26 Disposition:  Status is: Inpatient  Dispo: The patient is from: Skilled nursing facility Anticipated d/c is to: Skilled nursing facility vs home hospice/facility Anticipated d/c date is: To be determined Patient currently not medically stable for discharge; still requiring oxygen supplementation. Complaining of feeling short winded and receiving IV therapy.  Requiring 15 L nasal cannula supplementation and nonrebreather currently. Now experiencing high-grade temperature and is having next rate with newright upper lobe consolidation.   Consultants:  Oncology service curbside: Dr. Ellin Saba.  Palliative care   Procedures: See below for x-ray reports   Antimicrobials:  Anti-infectives (From admission, onward)   Start     Dose/Rate Route Frequency Ordered Stop   04/26/20 1200  cefTRIAXone (ROCEPHIN) 2 g in sodium chloride 0.9 % 100 mL IVPB        2 g 200 mL/hr over 30 Minutes Intravenous Every 24 hours 04/26/20 0848     04/25/20 1630  piperacillin-tazobactam (ZOSYN) IVPB 3.375 g  Status:  Discontinued        3.375 g 12.5 mL/hr over 240 Minutes Intravenous Every 8 hours 04/25/20 1551 04/26/20 0848   04/21/20 1000  remdesivir 100 mg in sodium chloride 0.9 % 100 mL IVPB        100 mg 200 mL/hr over 30 Minutes Intravenous Daily 04/17/2020 1108 04/24/20 1835   04/21/20 1000  remdesivir 100 mg in sodium chloride 0.9 % 100 mL IVPB  Status:  Discontinued       "Followed by" Linked Group Details   100 mg 200 mL/hr over 30 Minutes Intravenous Daily 04/12/2020 1139 04/25/2020 1147   04/26/2020 1200  remdesivir 100 mg in sodium chloride 0.9 % 100 mL IVPB        100 mg 200 mL/hr over 30 Minutes Intravenous Every 1 hr x 2 04/21/2020 1108 04/09/2020 1315   04/01/2020 1145  remdesivir 200 mg in sodium chloride 0.9% 250 mL IVPB  Status:  Discontinued       "Followed by" Linked Group Details   200 mg 580 mL/hr over 30 Minutes Intravenous Once 04/21/2020  1139 04/24/2020 1147   04/19/2020 1141  valACYclovir (VALTREX) tablet 1,000 mg        1,000 mg Oral Daily PRN 04/24/2020 1142         Subjective: Patient seen and evaluated today with ongoing confusion and fevers overnight.  He also continues to have liquid stool output and increasing white blood cell count.  Objective: Vitals:   04/27/20 0300 04/27/20 0400 04/27/20 0521 04/27/20 0834  BP: (!) 110/50 (!) 134/52    Pulse: 95 85    Resp: (!) 27 16    Temp:   (!) 101.2 F (38.4 C) (!) 102 F (38.9 C)  TempSrc:   Rectal Rectal  SpO2: (!) 86% (!) 86%    Weight:  135.8 kg    Height:        Intake/Output Summary (Last 24 hours) at 04/27/2020 0947 Last data filed at 04/26/2020 1805 Gross per 24 hour  Intake 269.53 ml  Output 650 ml  Net -380.47 ml   Filed Weights   04/27/2020 0656 04/27/20 0400  Weight: (!) 140.5 kg 135.8 kg    Examination:  General exam: Appears to be in minimal distress, obese Respiratory system: Clear to auscultation. Respiratory effort normal.  Currently on 5 L nasal cannula Cardiovascular system: S1 & S2 heard, RRR.  Gastrointestinal system: Abdomen is soft Central nervous system: Alert and awake Extremities: No edema Skin: No significant lesions noted Psychiatry: Flat affect.    Data Reviewed: I have personally reviewed following labs and imaging studies  CBC: Recent Labs  Lab 04/22/20 0913 04/23/20 0730 04/24/20 0454 04/25/20 0352 04/27/20 0430  WBC 78.8* 80.4* 86.4* 67.4* 98.9*  NEUTROABS 7.9* 5.6 8.6* 4.8 14.8*  HGB 10.4* 9.5* 9.5* 9.3* 8.3*  HCT 34.2* 31.1* 31.6* 31.0* 28.6*  MCV 104.0* 102.0* 103.6* 103.3* 104.8*  PLT 157 150 160 139* 678   Basic Metabolic Panel: Recent Labs  Lab 04/21/20 0725 04/22/20 0913 04/23/20 0730 04/24/20 0454 04/25/20 0352 04/27/20 0430  NA 139 140 138 140 142 141  K 4.3 4.1 4.1 4.2 4.2 3.2*  CL 109 109 107 106 106 106  CO2 21* 23 23 24 27 26   GLUCOSE 253* 209* 218* 143* 147* 54*  BUN 24* 24* 20 20 16  19   CREATININE 1.10 1.04 1.01 1.09 1.04 1.15  CALCIUM 7.5* 7.2* 7.1* 7.3* 7.1* 7.1*  MG 2.3 2.1 2.2 2.6* 2.5* 2.9*  PHOS 3.7 2.6 2.9 2.8 3.4  --    GFR: Estimated Creatinine Clearance: 87.7 mL/min (by C-G formula based on SCr of 1.15 mg/dL). Liver Function Tests: Recent Labs  Lab 04/22/20 0913 04/23/20 0730 04/24/20 0454 04/25/20 0352 04/27/20 0430  AST 26 22 22 28  45*  ALT 27 23 21 21 23   ALKPHOS 44 38 43 45 65  BILITOT 0.3 0.8 0.7 0.9 0.6  PROT 5.6* 5.6* 5.7* 5.5* 5.9*  ALBUMIN 2.7* 2.6* 2.4* 2.2* 2.1*   No results for input(s): LIPASE, AMYLASE in the last 168 hours. Recent Labs  Lab 04/26/20 1450 04/27/20 0430  AMMONIA 47* 54*   Coagulation Profile: No results for input(s): INR, PROTIME in the last 168 hours. Cardiac Enzymes: No results for input(s): CKTOTAL, CKMB, CKMBINDEX, TROPONINI in the last 168 hours. BNP (last 3 results) No results for input(s): PROBNP in the last 8760 hours. HbA1C: No results for input(s): HGBA1C in the last 72 hours. CBG: Recent Labs  Lab 04/26/20 0806 04/26/20 1125 04/26/20 1647 04/26/20 2028 04/27/20 0739  GLUCAP 126* 135* 83 119* 42*   Lipid Profile: No results for input(s): CHOL, HDL, LDLCALC, TRIG, CHOLHDL, LDLDIRECT in the last 72 hours. Thyroid Function Tests: Recent Labs    04/26/20 1456  TSH 2.697   Anemia Panel: Recent Labs    04/25/20 0352 04/27/20 0430  FERRITIN 1,481* 2,436*   Sepsis Labs: Recent Labs  Lab 04/24/2020 1048 04/25/20 1246 04/26/20 1456 04/27/20 0430  PROCALCITON  --  4.09 8.91 6.86  LATICACIDVEN 0.9  --   --   --     Recent Results (from the past 240 hour(s))  Blood Culture (routine x 2)     Status: None   Collection Time: 04/18/2020  8:02 AM   Specimen: BLOOD RIGHT HAND  Result Value Ref Range Status   Specimen Description BLOOD RIGHT HAND  Final   Special Requests   Final    BOTTLES DRAWN AEROBIC AND ANAEROBIC Blood Culture adequate volume   Culture   Final    NO GROWTH 5  DAYS Performed at [facility], [address] [place], [place] [address]    Report Status 04/25/2020 FINAL  Final  Resp Panel by RT-PCR (Flu A&B, Covid) Nasopharyngeal Swab     Status: Abnormal   Collection Time: 04/18/2020  8:05 AM   Specimen: Nasopharyngeal Swab; Nasopharyngeal(NP) swabs in vial transport medium  Result Value Ref Range Status   SARS Coronavirus 2 by RT PCR POSITIVE (A) NEGATIVE Final    Comment: RESULT CALLED TO, READ BACK BY AND VERIFIED WITH: MARTIN,D. RN @1058  04/19/2020 BILLINGSLEY,L (NOTE) SARS-CoV-2 target nucleic acids are DETECTED.  The SARS-CoV-2 RNA is generally detectable in upper respiratory specimens during the acute phase of infection. Positive results are indicative of the presence of the identified virus, but do not  rule out bacterial infection or co-infection with other pathogens not detected by the test. Clinical correlation with patient history and other diagnostic information is necessary to determine patient infection status. The expected result is Negative.  Fact Sheet for Patients: EntrepreneurPulse.com.au  Fact Sheet for Healthcare Providers: IncredibleEmployment.be  This test is not yet approved or cleared by the [place] FDA and  has been authorized for detection and/or diagnosis of SARS-CoV-2 by FDA under an Emergency Use Authorization (EUA).  This EUA will remain in effect (meaning this test  can be used) for the duration of  the COVID-19 declaration under Section 564(b)(1) of the Act, 21 U.S.C. section 360bbb-3(b)(1), unless the authorization is terminated or revoked sooner.     Influenza A by PCR NEGATIVE NEGATIVE Final   Influenza B by PCR NEGATIVE NEGATIVE Final    Comment: (NOTE) The Xpert Xpress SARS-CoV-2/FLU/RSV plus assay is intended as an aid in the diagnosis of influenza from Nasopharyngeal swab specimens and should not be used as a sole basis for treatment. Nasal washings  and aspirates are unacceptable for Xpert Xpress SARS-CoV-2/FLU/RSV testing.  Fact Sheet for Patients: EntrepreneurPulse.com.au  Fact Sheet for Healthcare Providers: IncredibleEmployment.be  This test is not yet approved or cleared by the [place] FDA and has been authorized for detection and/or diagnosis of SARS-CoV-2 by FDA under an Emergency Use Authorization (EUA). This EUA will remain in effect (meaning this test can be used) for the duration of the COVID-19 declaration under Section 564(b)(1) of the Act, 21 U.S.C. section 360bbb-3(b)(1), unless the authorization is terminated or revoked.  Performed at [facility], [address], [place], [place] [address]   Blood Culture (routine x 2)     Status: None   Collection Time: 04/27/2020  9:04 AM   Specimen: BLOOD  Result Value Ref Range Status   Specimen Description BLOOD LEFT ANTECUBITAL  Final   Special Requests   Final    BOTTLES DRAWN AEROBIC AND ANAEROBIC Blood Culture adequate volume   Culture   Final    NO GROWTH 6 DAYS Performed at [facility], [address], [place], [place] [address]    Report Status 04/26/2020 FINAL  Final  Urine Culture     Status: Abnormal (Preliminary result)   Collection Time: 04/25/20 11:40 AM   Specimen: Urine, Clean Catch  Result Value Ref Range Status   Specimen Description   Final    URINE, CLEAN CATCH Performed at [facility], [address] [place], [place] [address]    Special Requests   Final    NONE Performed at [facility], [address] [place], [place] [address]    Culture (A)  Final    >=100,000 COLONIES/mL ESCHERICHIA COLI 50,000 COLONIES/mL PROTEUS MIRABILIS SUSCEPTIBILITIES TO FOLLOW Performed at [facility], [place] [address], [place], [place] [address]    Report Status PENDING  Incomplete  Culture, blood (Routine X 2) w Reflex to ID Panel     Status: None (Preliminary result)   Collection Time: 04/25/20 12:46 PM    Specimen: BLOOD  Result Value Ref Range Status   Specimen Description   Final    BLOOD LEFT ANTECUBITAL Performed at [facility], [address], [place], [place] [address]    Special Requests   Final    Blood Culture adequate volume BOTTLES DRAWN AEROBIC AND ANAEROBIC Performed at [facility], [address], [place], [place] [address]    Culture  Setup Time   Final    IN BOTH  AEROBIC AND ANAEROBIC BOTTLES GRAM POSITIVE COCCI IN PAIRS Gram Stain Report Called to,Read Back By and Verified With: J HEARN,RN@0016  04/26/20 MKELLY CRITICAL VALUE NOTED.  VALUE IS CONSISTENT WITH PREVIOUSLY REPORTED AND CALLED VALUE. Performed at [facility], [place] [address] [place], [place] [address]    Culture GRAM POSITIVE COCCI  Final   Report Status PENDING  Incomplete  Culture, blood (Routine X 2) w Reflex to ID Panel     Status: Abnormal (Preliminary result)   Collection Time: 04/25/20 12:47 PM   Specimen: BLOOD  Result Value Ref Range Status   Specimen Description   Final    BLOOD LEFT ANTECUBITAL Performed at [facility], [address], [place], [place] [address]    Special Requests   Final    BOTTLES DRAWN AEROBIC AND ANAEROBIC Blood Culture results may not be optimal due to an inadequate volume of blood received in culture bottles Performed at [facility], [address], [place], [place] [address]    Culture  Setup Time   Final    AEROBIC BOTTLE ONLY GRAM POSITIVE COCCI IN PAIRS Gram Stain Report Called to,Read Back By and Verified With: J HEARN,RN 0016 04/26/20 MKELLY CRITICAL RESULT CALLED TO, READ BACK BY AND VERIFIED WITH: J,HEARN RN @0504  04/26/20 EB    Culture (A)  Final    STREPTOCOCCUS PNEUMONIAE SUSCEPTIBILITIES TO FOLLOW Performed at [facility], [place] [address] [place],  [address]    Report Status PENDING  Incomplete  Blood Culture ID Panel (Reflexed)     Status: Abnormal   Collection Time: 04/25/20 12:47 PM  Result Value Ref Range Status    Enterococcus faecalis NOT DETECTED NOT DETECTED Final   Enterococcus Faecium NOT DETECTED NOT DETECTED Final   Listeria monocytogenes NOT DETECTED NOT DETECTED Final   Staphylococcus species NOT DETECTED NOT DETECTED Final   Staphylococcus aureus (BCID) NOT DETECTED NOT DETECTED Final   Staphylococcus epidermidis NOT DETECTED NOT DETECTED Final   Staphylococcus lugdunensis NOT DETECTED NOT DETECTED Final   Streptococcus species DETECTED (A) NOT DETECTED Final    Comment: CRITICAL RESULT CALLED TO, READ BACK BY AND VERIFIED WITH: J,HEARN RN @0504  04/26/20 EB    Streptococcus agalactiae NOT DETECTED NOT DETECTED Final   Streptococcus pneumoniae DETECTED (A) NOT DETECTED Final    Comment: CRITICAL RESULT CALLED TO, READ BACK BY AND VERIFIED WITH: J,HEARN RN @0504  04/26/20 EB    Streptococcus pyogenes NOT DETECTED NOT DETECTED Final   A.calcoaceticus-baumannii NOT DETECTED NOT DETECTED Final   Bacteroides fragilis NOT DETECTED NOT DETECTED Final   Enterobacterales NOT DETECTED NOT DETECTED Final   Enterobacter cloacae complex NOT DETECTED NOT DETECTED Final   Escherichia coli NOT DETECTED NOT DETECTED Final   Klebsiella aerogenes NOT DETECTED NOT DETECTED Final   Klebsiella oxytoca NOT DETECTED NOT DETECTED Final   Klebsiella pneumoniae NOT DETECTED NOT DETECTED Final   Proteus species NOT DETECTED NOT DETECTED Final   Salmonella species NOT DETECTED NOT DETECTED Final   Serratia marcescens NOT DETECTED NOT DETECTED Final   Haemophilus influenzae NOT DETECTED NOT DETECTED Final   Neisseria meningitidis NOT DETECTED NOT DETECTED Final   Pseudomonas aeruginosa NOT DETECTED NOT DETECTED Final   Stenotrophomonas maltophilia NOT DETECTED NOT DETECTED Final   Candida albicans NOT DETECTED NOT DETECTED Final   Candida auris NOT DETECTED NOT DETECTED Final   Candida glabrata NOT DETECTED NOT DETECTED Final   Candida krusei NOT DETECTED NOT DETECTED Final   Candida parapsilosis NOT DETECTED NOT  DETECTED Final   Candida tropicalis NOT DETECTED NOT DETECTED Final   Cryptococcus neoformans/gattii NOT DETECTED NOT DETECTED Final    Comment: Performed at [facility], [place] [address] [place], [place] [address]  MRSA PCR Screening     Status: None   Collection Time: 04/25/20 10:00 PM   Specimen: Nasal Mucosa; Nasopharyngeal  Result Value Ref Range Status   MRSA by PCR NEGATIVE NEGATIVE Final    Comment:        The GeneXpert MRSA Assay (FDA approved for NASAL specimens only), is one component of a comprehensive MRSA colonization surveillance program. It is not intended to diagnose MRSA infection nor to guide or monitor treatment for MRSA infections. Performed at [facility], [address], [place], [place] [address]          Radiology Studies: DG CHEST PORT 1 VIEW  Result Date: 04/25/2020 CLINICAL DATA:  COVID-19 positivity with shortness of breath and fevers EXAM: PORTABLE CHEST 1 VIEW COMPARISON:  04/03/2020 FINDINGS: Cardiac shadow is stable. Increasing consolidation in the right upper lobe is noted. Patchy airspace opacities are noted bilaterally consistent with the given clinical history. No acute bony abnormality is seen. IMPRESSION: Increasing consolidation in the right upper lobe when compared with the prior study. Electronically Signed   By: Inez Catalina M.D.   On: 04/25/2020 12:25        Scheduled Meds: . allopurinol  300 mg Oral Daily  . amiodarone  200 mg Oral Daily  . vitamin C  500 mg Oral Daily  . aspirin EC  81 mg Oral Daily  . atorvastatin  80 mg Oral Daily  . benzonatate  200 mg Oral TID  . Chlorhexidine Gluconate Cloth  6 each Topical Daily  . enoxaparin (LOVENOX) injection  70 mg Subcutaneous Q24H  . insulin aspart  0-5 Units Subcutaneous QHS  . insulin aspart  0-9 Units Subcutaneous TID WC  . insulin detemir  0.12 Units/kg Subcutaneous BID  . Ipratropium-Albuterol  1 puff Inhalation TID  . lactulose  20 g Oral TID  . methylPREDNISolone  (SOLU-MEDROL) injection  125 mg Intravenous Q12H  . metoprolol tartrate  12.5 mg Oral BID  . multivitamin with minerals  1 tablet Oral Daily  . pantoprazole  40 mg Oral Daily  . potassium chloride  40 mEq Oral Once  . tamsulosin  0.4 mg Oral QPC supper  . trimethoprim-polymyxin b  1 drop Both Eyes Q6H  . zinc sulfate  220 mg Oral Daily   Continuous Infusions: . sodium chloride Stopped (04/25/20 1659)  . cefTRIAXone (ROCEPHIN)  IV Stopped (04/26/20 1437)     LOS: 7 days    Time spent: 35 minutes    Nike Southwell D Manuella Ghazi, DO [facility]  If 7PM-7AM, please contact night-coverage [email or web address] 04/27/2020, 9:47 AM

## 2020-04-27 NOTE — Progress Notes (Signed)
Palliative:  Received report from RN as Romin is sleeping comfortably currently. He is still running high fevers and WBC increasing. He was able to eat some today. He has been more alert although confused. Confusion seems to be less this afternoon. Only requiring 4L oxygen. Resting comfortably. I attempted to reach [facility] for follow up but no answer and mailbox was full. I will try again tomorrow and this will give time to see how Javi will do and if confusion can hopefully begin to clear so I can have a conversation with him.   No charge  Vinie Sill, NP Palliative Medicine Team Pager [phone] (Please see [email or web address] for schedule) Team Phone [phone]    Greater than 50%  of this time was spent counseling and coordinating care related to the above assessment and plan

## 2020-04-27 NOTE — Progress Notes (Signed)
Inpatient Diabetes Program Recommendations  AACE/ADA: New Consensus Statement on Inpatient Glycemic Control (2015)  Target Ranges:  Prepandial:   less than 140 mg/dL      Peak postprandial:   less than 180 mg/dL (1-2 hours)      Critically ill patients:  140 - 180 mg/dL   Lab Results  Component Value Date   GLUCAP 42 (LL) 04/27/2020   HGBA1C 6.7 (H) 04/19/2020    Review of Glycemic Control Results for DARRALL, STREY (MRN [id number]) as of 04/27/2020 09:41  Ref. Range 04/26/2020 16:47 04/26/2020 20:28 04/27/2020 07:39  Glucose-Capillary Latest Ref Range: 70 - 99 mg/dL 83 119 (H) 42 (LL)    Diabetes history: DM2 Outpatient Diabetes medications: Lantus 30 units daily, Novolog 0-9 units TID Current orders for Inpatient glycemic control: Levemir 17 units BID, Novolog 0-9 units TID with meals, Novolog 0-5 units QHS; Solumedrol 125 mg Q12H  Inpatient Diabetes Program Recommendations:    Noted hypoglycemia this AM and addition of Solumedrol.  Consider decreasing Levemir to 15 units BID. Secure chat sent to MD.   Thanks,  Bronson Curb, MSN, RNC-OB Diabetes Coordinator [phone] (8a-5p)

## 2020-04-28 DIAGNOSIS — J9621 Acute and chronic respiratory failure with hypoxia: Secondary | ICD-10-CM | POA: Diagnosis not present

## 2020-04-28 DIAGNOSIS — U071 COVID-19: Secondary | ICD-10-CM | POA: Diagnosis not present

## 2020-04-28 DIAGNOSIS — C9110 Chronic lymphocytic leukemia of B-cell type not having achieved remission: Secondary | ICD-10-CM | POA: Diagnosis not present

## 2020-04-28 DIAGNOSIS — Z7189 Other specified counseling: Secondary | ICD-10-CM | POA: Diagnosis not present

## 2020-04-28 DIAGNOSIS — Z515 Encounter for palliative care: Secondary | ICD-10-CM | POA: Diagnosis not present

## 2020-04-28 LAB — CBC WITH DIFFERENTIAL/PLATELET
Band Neutrophils: 1 %
Basophils Absolute: 0 10*3/uL (ref 0.0–0.1)
Basophils Relative: 0 %
Blasts: 30 %
Eosinophils Absolute: 0 10*3/uL (ref 0.0–0.5)
Eosinophils Relative: 0 %
HCT: 32.9 % — ABNORMAL LOW (ref 39.0–52.0)
Hemoglobin: 9.6 g/dL — ABNORMAL LOW (ref 13.0–17.0)
Lymphocytes Relative: 59 %
Lymphs Abs: 65.4 10*3/uL — ABNORMAL HIGH (ref 0.7–4.0)
MCH: 31.4 pg (ref 26.0–34.0)
MCHC: 29.2 g/dL — ABNORMAL LOW (ref 30.0–36.0)
MCV: 107.5 fL — ABNORMAL HIGH (ref 80.0–100.0)
Monocytes Absolute: 0 10*3/uL — ABNORMAL LOW (ref 0.1–1.0)
Monocytes Relative: 0 %
Neutro Abs: 12.2 10*3/uL — ABNORMAL HIGH (ref 1.7–7.7)
Neutrophils Relative %: 10 %
Platelets: 143 10*3/uL — ABNORMAL LOW (ref 150–400)
RBC: 3.06 MIL/uL — ABNORMAL LOW (ref 4.22–5.81)
RDW: 17 % — ABNORMAL HIGH (ref 11.5–15.5)
WBC: 110.8 10*3/uL (ref 4.0–10.5)
nRBC: 0 % (ref 0.0–0.2)

## 2020-04-28 LAB — URINE CULTURE: Culture: >=100000 — AB

## 2020-04-28 LAB — CULTURE, BLOOD (ROUTINE X 2): Special Requests: ADEQUATE

## 2020-04-28 LAB — GLUCOSE, CAPILLARY
Glucose-Capillary: 300 mg/dL — ABNORMAL HIGH (ref 70–99)
Glucose-Capillary: 277 mg/dL — ABNORMAL HIGH (ref 70–99)
Glucose-Capillary: 243 mg/dL — ABNORMAL HIGH (ref 70–99)
Glucose-Capillary: 257 mg/dL — ABNORMAL HIGH (ref 70–99)

## 2020-04-28 LAB — COMPREHENSIVE METABOLIC PANEL
ALT: 29 U/L (ref 0–44)
AST: 44 U/L — ABNORMAL HIGH (ref 15–41)
Albumin: 2.4 g/dL — ABNORMAL LOW (ref 3.5–5.0)
Alkaline Phosphatase: 72 U/L (ref 38–126)
Anion gap: 13 (ref 5–15)
BUN: 29 mg/dL — ABNORMAL HIGH (ref 8–23)
CO2: 24 mmol/L (ref 22–32)
Calcium: 7.7 mg/dL — ABNORMAL LOW (ref 8.9–10.3)
Chloride: 106 mmol/L (ref 98–111)
Creatinine, Ser: 1.4 mg/dL — ABNORMAL HIGH (ref 0.61–1.24)
GFR, Estimated: 55 mL/min — ABNORMAL LOW (ref >60)
Glucose, Bld: 323 mg/dL — ABNORMAL HIGH (ref 70–99)
Potassium: 4 mmol/L (ref 3.5–5.1)
Sodium: 143 mmol/L (ref 135–145)
Total Bilirubin: 0.7 mg/dL (ref 0.3–1.2)
Total Protein: 6.9 g/dL (ref 6.5–8.1)

## 2020-04-28 LAB — FERRITIN: Ferritin: 2862 ng/mL — ABNORMAL HIGH (ref 24–336)

## 2020-04-28 LAB — C DIFFICILE (CDIFF) QUICK SCRN (NO PCR REFLEX)
C Diff antigen: NEGATIVE
C Diff interpretation: NOT DETECTED
C Diff toxin: NEGATIVE

## 2020-04-28 LAB — C-REACTIVE PROTEIN: CRP: 38.1 mg/dL — ABNORMAL HIGH (ref <1.0)

## 2020-04-28 LAB — PROCALCITONIN: Procalcitonin: 4.54 ng/mL

## 2020-04-28 MED ORDER — LACTATED RINGERS IV SOLN: INTRAVENOUS | Status: DC

## 2020-04-28 MED ORDER — IPRATROPIUM-ALBUTEROL 20-100 MCG/ACT IN AERS
1.0000 | INHALATION_SPRAY | Freq: Two times a day (BID) | RESPIRATORY_TRACT | Status: DC
  Administered 2020-04-29 – 2020-05-03 (×9): 1 via RESPIRATORY_TRACT

## 2020-04-28 NOTE — Progress Notes (Signed)
Palliative:  HPI: 70 y.o. male  with past medical history of CLL, anemia, lymphocytosis, thrombocytosis, lymphadenopathy, hypertension, tremors, BPH admitted on 04/21/2020 with shortness of breath, malaise, cough related to COVID infection. 04/25/20 developed high fever and altered mental status.   I met today at Tim Duncan's bedside. He is more awake and alert today. I discussed with him that he continues to be very ill with COVID, UTI, and blood infection. We discussed concern that his immune system is strong enough to fight all these infections to allow him to recover. Harlo admits that he does feel terrible and miserable. We did discuss the alternative to aggressive treatment would be to focus on symptom management to help him be more comfortable. He does not feel that he is ready for comfort care at this time. He wishes to continues with aggressive care. However, he would not want to suffer if he declined further towards end of life. He confirms desire for DNR. He also confirms HCPOA as Caprice Red and gives me permission to call Roselyn Reef.   I attempted to call Roselyn Reef and left voicemail with 2 contact numbers to reach me but I did not receive return phone call. I discussed conversation above with Dr. Manuella Ghazi who has been in contact with [facility]. Low threshold to consider comfort care with further decline given underlying CLL, multiple active infections, poor quality of life prior to infections, and poor prognosis overall.   Exam: Alert and oriented today. No distress. Fatigue. HR 80-90s. Sats upper 80s on 10L HFNC. Overall weak.   Plan: - Continue current interventions.  - Continue conversations with Danuel and [employer].  - Consider comfort care if he declines further.   25 min  Vinie Sill, NP Palliative Medicine Team Pager [phone] (Please see [email or web address] for schedule) Team Phone [phone]    Greater than 50%  of this time was spent counseling and coordinating care related to the above  assessment and plan

## 2020-04-28 NOTE — Progress Notes (Signed)
CRITICAL VALUE ALERT  Critical Value:  WBC 110.8  Date & Time Notied:  1/28 @ 1540.  Provider Notified: Manuella Ghazi, MD.  Orders Received/Actions taken: Awaiting further orders.

## 2020-04-28 NOTE — Progress Notes (Signed)
PROGRESS NOTE    Tim Duncan  [id number] DOB: [birth year] DOA: 04/07/2020 PCP: Tim Squibb, MD   Brief Narrative:  Tim Schulze Moyeris a 70 y.o.malewith medical history significant ofhypertension, BPH, atrial fibrillation (not chronically on anticoagulation), chronic lymphocytic leukemia (actively follow by oncology service at [facility]), hyperlipidemia, morbid obesity and diabetes; who presented to the emergency department secondary to general malaise, shortness of breath and intermittent coughing spells. Patient symptoms have been present for the last 2 days or so and was tested and found to be positive for COVID. He is a skilled nursing facility oxygen saturation on room air was around 75%. Patient reports chronic diarrhea, no nausea or vomiting. Expressed having chills. Nonproductive coughing spells and shortness of breath at rest on exertion. No hemoptysis. Of note, patient expressed to be vaccinated with Wynetta Emery & Wynetta Emery vaccine in the past.   ED Course:Inflammatory markers corroborating acute COVID infection; bilateral infiltrates appreciated on all his lungs characteristic for COVID. IV steroids and remdesivir's initiated. [facility] has been contacted to place patient in the hospital for acute respiratory failure with hypoxia in the setting of COVID-pneumonia. Patient is requiring 4 L nasal cannula supplementation.    Assessment & Plan:   Active Problems:   Acute on chronic respiratory failure with hypoxia (HCC)   Acute on chronic hypoxemic respiratory failure -Currently on 10 L nasal cannula oxygen -Continue to follow inflammatory markers -Completedremdesivir infusion 04/24/20. -Baricitinib discontinued 1/27 -Resume the use of IV steroids, restarted 1/27 -Continue to follow vitamin C and zinc -Will continue as needed bronchodilator, proning position, incentive spirometer and flutter valve -Follow clinical response and continue supportive care. -Continue as  needed antitussive medications. -Patient willremain in the stepdown unit; incase he endedrequiringheated high flow oxygen supplementation.  Acute encephalopathy-multifactorial, improving -Appears to be related to hepatic encephalopathy with some mild elevation in ammonia levels for which lactulose started, follow trend -TSH within normal limits -Likely related to critical illness  Strep pneumonia bacteremia with ongoing fevers -Currently on full dose Rocephin for treatment -Procalcitonin downtrending  E. coli and Proteus UTI -Sensitive to Rocephin which will be continued for now  Ongoing diarrhea-improving -C. difficile negative and GI panel pending  History of paroxysmal atrial fibrillation -Sinus rhythm and rate controlled -Continue amiodarone and beta-blocker.  CLL -WBCs in the100s range currently -Continue to follow trend and continue to monitor procalcitonin for better guidance which has been downtrending -Case discussed with oncology service (Dr. Delton Coombes) and recommendations given toholdvenetoclax while acutely ill; may resume on dc  Type 2 diabetes -Continue sliding scale insulin and Levemir -Follow CBGs and adjust hypoglycemic regimen as needed.  Gastroesophageal reflux disease/GI prophylaxis -Change PPI to H2 blocker  Essential hypertension -Currently stable and well-controlled -Followed by -Continue adjusted dose of antihypertensive regimen.  Acute kidney injury -Urine output 1850, resume LR -In the setting of dehydration and prerenal azotemia -UA not demonstrating acute infection. -Continue to minimize the use of nephrotoxic agents and avoid hypotension -Maintain adequate hydration -Renal function stable  Hyperlipidemia -Continue statins.  History of BPH -Continue Flomax.  Morbid obesity -Body mass index is 42.01 kg/m. -Low-calorie diet, portion control increase physical activity discussed with patient.   DVT  prophylaxis:Lovenox Code Status:Full code. Family Communication:Discussed with friend who is the power of attorney on phone 1/26, did not pick up 1/28 Disposition:  Status is: Inpatient  Dispo: The patient is from: Skilled nursing facility Anticipated d/c is to: Skilled nursing facility vs home hospice/facility Anticipated d/c date is: To be determined Patient  currently not medically stable for discharge; still requiring oxygen supplementation. Complaining of feeling short winded and receiving IV therapy. Requiring 10 L nasal cannula supplementation and nonrebreather currently. Now experiencing high-grade temperature and is having next rate with newright upper lobe consolidation.   Consultants:  Oncology service curbside: Dr. Delton Coombes.  Palliative care   Procedures: See below for x-ray reports  Antimicrobials:  Anti-infectives (From admission, onward)   Start     Dose/Rate Route Frequency Ordered Stop   04/26/20 1200  cefTRIAXone (ROCEPHIN) 2 g in sodium chloride 0.9 % 100 mL IVPB        2 g 200 mL/hr over 30 Minutes Intravenous Every 24 hours 04/26/20 0848     04/25/20 1630  piperacillin-tazobactam (ZOSYN) IVPB 3.375 g  Status:  Discontinued        3.375 g 12.5 mL/hr over 240 Minutes Intravenous Every 8 hours 04/25/20 1551 04/26/20 0848   04/21/20 1000  remdesivir 100 mg in sodium chloride 0.9 % 100 mL IVPB        100 mg 200 mL/hr over 30 Minutes Intravenous Daily 04-28-2020 1108 04/24/20 1835   04/21/20 1000  remdesivir 100 mg in sodium chloride 0.9 % 100 mL IVPB  Status:  Discontinued       "Followed by" Linked Group Details   100 mg 200 mL/hr over 30 Minutes Intravenous Daily Apr 28, 2020 1139 04-28-2020 1147   04/28/20 1200  remdesivir 100 mg in sodium chloride 0.9 % 100 mL IVPB        100 mg 200 mL/hr over 30 Minutes Intravenous Every 1 hr x 2 Apr 28, 2020 1108 04-28-20 1315   04/28/20 1145  remdesivir 200 mg in sodium  chloride 0.9% 250 mL IVPB  Status:  Discontinued       "Followed by" Linked Group Details   200 mg 580 mL/hr over 30 Minutes Intravenous Once 2020/04/28 1139 04/28/2020 1147   Apr 28, 2020 1141  valACYclovir (VALTREX) tablet 1,000 mg        1,000 mg Oral Daily PRN Apr 28, 2020 1142         Subjective: Patient seen and evaluated today with no new acute complaints or concerns. No acute concerns or events noted overnight. Continues to cough.  Objective: Vitals:   04/28/20 0200 04/28/20 0300 04/28/20 0400 04/28/20 0831  BP: 132/60 (!) 143/81 (!) 127/53   Pulse: 87 91 85   Resp: (!) 24 (!) 32 (!) 24   Temp:    100 F (37.8 C)  TempSrc:    Rectal  SpO2: 95% 94% 95% 94%  Weight:      Height:        Intake/Output Summary (Last 24 hours) at 04/28/2020 1036 Last data filed at 04/28/2020 0600 Gross per 24 hour  Intake 1200 ml  Output 1850 ml  Net -650 ml   Filed Weights   28-Apr-2020 0656 04/27/20 0400  Weight: (!) 140.5 kg 135.8 kg    Examination:  General exam: Appears calm and comfortable, obese Respiratory system: Clear to auscultation. Respiratory effort normal.  Currently on 10 L nasal cannula oxygen Cardiovascular system: S1 & S2 heard, RRR.  Gastrointestinal system: Abdomen is soft Central nervous system: Alert and awake Extremities: No edema Skin: No significant lesions noted Psychiatry: Flat affect.    Data Reviewed: I have personally reviewed following labs and imaging studies  CBC: Recent Labs  Lab 04/23/20 0730 04/24/20 0454 04/25/20 0352 04/27/20 0430 04/28/20 0621  WBC 80.4* 86.4* 67.4* 98.9* 110.8*  NEUTROABS 5.6 8.6* 4.8 14.8* 12.2*  HGB 9.5* 9.5* 9.3* 8.3* 9.6*  HCT 31.1* 31.6* 31.0* 28.6* 32.9*  MCV 102.0* 103.6* 103.3* 104.8* 107.5*  PLT 150 160 139* 152 [id number]*   Basic Metabolic Panel: Recent Labs  Lab 04/22/20 0913 04/23/20 0730 04/24/20 0454 04/25/20 0352 04/27/20 0430 04/28/20 0621  NA 140 138 140 142 141 143  K 4.1 4.1 4.2 4.2 3.2* 4.0  CL 109  107 106 106 106 106  CO2 23 23 24 27 26 24   GLUCOSE 209* 218* 143* 147* 54* 323*  BUN 24* 20 20 16 19  29*  CREATININE 1.04 1.01 1.09 1.04 1.15 1.40*  CALCIUM 7.2* 7.1* 7.3* 7.1* 7.1* 7.7*  MG 2.1 2.2 2.6* 2.5* 2.9*  --   PHOS 2.6 2.9 2.8 3.4  --   --    GFR: Estimated Creatinine Clearance: 72.1 mL/min (A) (by C-G formula based on SCr of 1.4 mg/dL (H)). Liver Function Tests: Recent Labs  Lab 04/23/20 0730 04/24/20 0454 04/25/20 0352 04/27/20 0430 04/28/20 0621  AST 22 22 28  45* 44*  ALT 23 21 21 23 29   ALKPHOS 38 43 45 65 72  BILITOT 0.8 0.7 0.9 0.6 0.7  PROT 5.6* 5.7* 5.5* 5.9* 6.9  ALBUMIN 2.6* 2.4* 2.2* 2.1* 2.4*   No results for input(s): LIPASE, AMYLASE in the last 168 hours. Recent Labs  Lab 04/26/20 1450 04/27/20 0430  AMMONIA 47* 54*   Coagulation Profile: No results for input(s): INR, PROTIME in the last 168 hours. Cardiac Enzymes: No results for input(s): CKTOTAL, CKMB, CKMBINDEX, TROPONINI in the last 168 hours. BNP (last 3 results) No results for input(s): PROBNP in the last 8760 hours. HbA1C: No results for input(s): HGBA1C in the last 72 hours. CBG: Recent Labs  Lab 04/26/20 2028 04/27/20 0739 04/27/20 1648 04/27/20 2117 04/28/20 0829  GLUCAP 119* 42* 387* 332* 300*   Lipid Profile: No results for input(s): CHOL, HDL, LDLCALC, TRIG, CHOLHDL, LDLDIRECT in the last 72 hours. Thyroid Function Tests: Recent Labs    04/26/20 1456  TSH 2.697   Anemia Panel: Recent Labs    04/27/20 0430 04/28/20 0621  FERRITIN 2,436* 2,862*   Sepsis Labs: Recent Labs  Lab 04/25/20 1246 04/26/20 1456 04/27/20 0430 04/28/20 0621  PROCALCITON 4.09 8.91 6.86 4.54    Recent Results (from the past 240 hour(s))  Blood Culture (routine x 2)     Status: None   Collection Time: 04/21/2020  8:02 AM   Specimen: BLOOD RIGHT HAND  Result Value Ref Range Status   Specimen Description BLOOD RIGHT HAND  Final   Special Requests   Final    BOTTLES DRAWN AEROBIC AND  ANAEROBIC Blood Culture adequate volume   Culture   Final    NO GROWTH 5 DAYS Performed at [facility], [address], [place], [place] [address]    Report Status 04/25/2020 FINAL  Final  Resp Panel by RT-PCR (Flu A&B, Covid) Nasopharyngeal Swab     Status: Abnormal   Collection Time: 04/08/2020  8:05 AM   Specimen: Nasopharyngeal Swab; Nasopharyngeal(NP) swabs in vial transport medium  Result Value Ref Range Status   SARS Coronavirus 2 by RT PCR POSITIVE (A) NEGATIVE Final    Comment: RESULT CALLED TO, READ BACK BY AND VERIFIED WITH: MARTIN,D. RN @1058  04/17/2020 BILLINGSLEY,L (NOTE) SARS-CoV-2 target nucleic acids are DETECTED.  The SARS-CoV-2 RNA is generally detectable in upper respiratory specimens during the acute phase of infection. Positive results are indicative of the presence of the identified virus, but do not rule  out bacterial infection or co-infection with other pathogens not detected by the test. Clinical correlation with patient history and other diagnostic information is necessary to determine patient infection status. The expected result is Negative.  Fact Sheet for Patients: EntrepreneurPulse.com.au  Fact Sheet for Healthcare Providers: IncredibleEmployment.be  This test is not yet approved or cleared by the [place] FDA and  has been authorized for detection and/or diagnosis of SARS-CoV-2 by FDA under an Emergency Use Authorization (EUA).  This EUA will remain in effect (meaning this test  can be used) for the duration of  the COVID-19 declaration under Section 564(b)(1) of the Act, 21 U.S.C. section 360bbb-3(b)(1), unless the authorization is terminated or revoked sooner.     Influenza A by PCR NEGATIVE NEGATIVE Final   Influenza B by PCR NEGATIVE NEGATIVE Final    Comment: (NOTE) The Xpert Xpress SARS-CoV-2/FLU/RSV plus assay is intended as an aid in the diagnosis of influenza from Nasopharyngeal swab specimens  and should not be used as a sole basis for treatment. Nasal washings and aspirates are unacceptable for Xpert Xpress SARS-CoV-2/FLU/RSV testing.  Fact Sheet for Patients: EntrepreneurPulse.com.au  Fact Sheet for Healthcare Providers: IncredibleEmployment.be  This test is not yet approved or cleared by the [place] FDA and has been authorized for detection and/or diagnosis of SARS-CoV-2 by FDA under an Emergency Use Authorization (EUA). This EUA will remain in effect (meaning this test can be used) for the duration of the COVID-19 declaration under Section 564(b)(1) of the Act, 21 U.S.C. section 360bbb-3(b)(1), unless the authorization is terminated or revoked.  Performed at [facility], [address], [place], [place] [address]   Blood Culture (routine x 2)     Status: None   Collection Time: 04/08/2020  9:04 AM   Specimen: BLOOD  Result Value Ref Range Status   Specimen Description BLOOD LEFT ANTECUBITAL  Final   Special Requests   Final    BOTTLES DRAWN AEROBIC AND ANAEROBIC Blood Culture adequate volume   Culture   Final    NO GROWTH 6 DAYS Performed at [facility], [address], [place], [place] [address]    Report Status 04/26/2020 FINAL  Final  Urine Culture     Status: Abnormal   Collection Time: 04/25/20 11:40 AM   Specimen: Urine, Clean Catch  Result Value Ref Range Status   Specimen Description   Final    URINE, CLEAN CATCH Performed at [facility], [address], [place], [place] [address]    Special Requests   Final    NONE Performed at [facility], [address], [place], [place] [address]    Culture (A)  Final    >=100,000 COLONIES/mL ESCHERICHIA COLI 50,000 COLONIES/mL PROTEUS MIRABILIS    Report Status 04/28/2020 FINAL  Final   Organism ID, Bacteria ESCHERICHIA COLI (A)  Final   Organism ID, Bacteria PROTEUS MIRABILIS (A)  Final      Susceptibility   Escherichia coli - MIC*    AMPICILLIN 4 SENSITIVE  Sensitive     CEFAZOLIN <=4 SENSITIVE Sensitive     CEFEPIME <=0.12 SENSITIVE Sensitive     CEFTRIAXONE <=0.25 SENSITIVE Sensitive     CIPROFLOXACIN <=0.25 SENSITIVE Sensitive     GENTAMICIN <=1 SENSITIVE Sensitive     IMIPENEM <=0.25 SENSITIVE Sensitive     NITROFURANTOIN <=16 SENSITIVE Sensitive     TRIMETH/SULFA <=20 SENSITIVE Sensitive     AMPICILLIN/SULBACTAM <=2 SENSITIVE Sensitive     PIP/TAZO <=4 SENSITIVE Sensitive     * >=  100,000 COLONIES/mL ESCHERICHIA COLI   Proteus mirabilis - MIC*    AMPICILLIN <=2 SENSITIVE Sensitive     CEFAZOLIN <=4 SENSITIVE Sensitive     CEFEPIME <=0.12 SENSITIVE Sensitive     CEFTRIAXONE <=0.25 SENSITIVE Sensitive     CIPROFLOXACIN <=0.25 SENSITIVE Sensitive     GENTAMICIN <=1 SENSITIVE Sensitive     IMIPENEM 2 SENSITIVE Sensitive     NITROFURANTOIN 128 RESISTANT Resistant     TRIMETH/SULFA <=20 SENSITIVE Sensitive     AMPICILLIN/SULBACTAM <=2 SENSITIVE Sensitive     PIP/TAZO <=4 SENSITIVE Sensitive     * 50,000 COLONIES/mL PROTEUS MIRABILIS  Culture, blood (Routine X 2) w Reflex to ID Panel     Status: Abnormal   Collection Time: 04/25/20 12:46 PM   Specimen: BLOOD  Result Value Ref Range Status   Specimen Description   Final    BLOOD LEFT ANTECUBITAL Performed at [facility], [address] [place], [place] [address]    Special Requests   Final    Blood Culture adequate volume BOTTLES DRAWN AEROBIC AND ANAEROBIC Performed at [facility], [address] [place], [place] [address]    Culture  Setup Time   Final    IN BOTH AEROBIC AND ANAEROBIC BOTTLES GRAM POSITIVE COCCI IN PAIRS Gram Stain Report Called to,Read Back By and Verified With: J HEARN,RN@0016  04/26/20 MKELLY CRITICAL VALUE NOTED.  VALUE IS CONSISTENT WITH PREVIOUSLY REPORTED AND CALLED VALUE.    Culture (A)  Final    STREPTOCOCCUS PNEUMONIAE SUSCEPTIBILITIES PERFORMED ON PREVIOUS CULTURE WITHIN THE LAST 5 DAYS. Performed at [facility], [place] [address],  [place], [place] [address]    Report Status 04/28/2020 FINAL  Final  Culture, blood (Routine X 2) w Reflex to ID Panel     Status: Abnormal   Collection Time: 04/25/20 12:47 PM   Specimen: BLOOD  Result Value Ref Range Status   Specimen Description   Final    BLOOD LEFT ANTECUBITAL Performed at [facility], [address] [place], [place] [address]    Special Requests   Final    BOTTLES DRAWN AEROBIC AND ANAEROBIC Blood Culture results may not be optimal due to an inadequate volume of blood received in culture bottles Performed at [facility], [address], [place], [place] [address]    Culture  Setup Time   Final    AEROBIC BOTTLE ONLY GRAM POSITIVE COCCI IN PAIRS Gram Stain Report Called to,Read Back By and Verified With: J HEARN,RN 0016 04/26/20 MKELLY CRITICAL RESULT CALLED TO, READ BACK BY AND VERIFIED WITH: J,HEARN RN @0504  04/26/20 EB Performed at [facility], [place] [address], [place],  [address]    Culture STREPTOCOCCUS PNEUMONIAE (A)  Final   Report Status 04/28/2020 FINAL  Final   Organism ID, Bacteria STREPTOCOCCUS PNEUMONIAE  Final      Susceptibility   Streptococcus pneumoniae - MIC*    ERYTHROMYCIN <=0.12 SENSITIVE Sensitive     LEVOFLOXACIN 0.5 SENSITIVE Sensitive     VANCOMYCIN 0.5 SENSITIVE Sensitive     PENO - penicillin <=0.06      PENICILLIN (non-meningitis) <=0.06 SENSITIVE Sensitive     PENICILLIN (oral) <=0.06 SENSITIVE Sensitive     CEFTRIAXONE (non-meningitis) <=0.12 SENSITIVE Sensitive     * STREPTOCOCCUS PNEUMONIAE  Blood Culture ID Panel (Reflexed)     Status: Abnormal   Collection Time: 04/25/20 12:47 PM  Result Value Ref Range Status   Enterococcus faecalis NOT DETECTED NOT DETECTED Final   Enterococcus Faecium  NOT DETECTED NOT DETECTED Final   Listeria monocytogenes NOT DETECTED NOT DETECTED Final   Staphylococcus species NOT DETECTED NOT DETECTED Final   Staphylococcus aureus (BCID) NOT DETECTED NOT DETECTED Final   Staphylococcus  epidermidis NOT DETECTED NOT DETECTED Final   Staphylococcus lugdunensis NOT DETECTED NOT DETECTED Final   Streptococcus species DETECTED (A) NOT DETECTED Final    Comment: CRITICAL RESULT CALLED TO, READ BACK BY AND VERIFIED WITH: J,HEARN RN @0504  04/26/20 EB    Streptococcus agalactiae NOT DETECTED NOT DETECTED Final   Streptococcus pneumoniae DETECTED (A) NOT DETECTED Final    Comment: CRITICAL RESULT CALLED TO, READ BACK BY AND VERIFIED WITH: J,HEARN RN @0504  04/26/20 EB    Streptococcus pyogenes NOT DETECTED NOT DETECTED Final   A.calcoaceticus-baumannii NOT DETECTED NOT DETECTED Final   Bacteroides fragilis NOT DETECTED NOT DETECTED Final   Enterobacterales NOT DETECTED NOT DETECTED Final   Enterobacter cloacae complex NOT DETECTED NOT DETECTED Final   Escherichia coli NOT DETECTED NOT DETECTED Final   Klebsiella aerogenes NOT DETECTED NOT DETECTED Final   Klebsiella oxytoca NOT DETECTED NOT DETECTED Final   Klebsiella pneumoniae NOT DETECTED NOT DETECTED Final   Proteus species NOT DETECTED NOT DETECTED Final   Salmonella species NOT DETECTED NOT DETECTED Final   Serratia marcescens NOT DETECTED NOT DETECTED Final   Haemophilus influenzae NOT DETECTED NOT DETECTED Final   Neisseria meningitidis NOT DETECTED NOT DETECTED Final   Pseudomonas aeruginosa NOT DETECTED NOT DETECTED Final   Stenotrophomonas maltophilia NOT DETECTED NOT DETECTED Final   Candida albicans NOT DETECTED NOT DETECTED Final   Candida auris NOT DETECTED NOT DETECTED Final   Candida glabrata NOT DETECTED NOT DETECTED Final   Candida krusei NOT DETECTED NOT DETECTED Final   Candida parapsilosis NOT DETECTED NOT DETECTED Final   Candida tropicalis NOT DETECTED NOT DETECTED Final   Cryptococcus neoformans/gattii NOT DETECTED NOT DETECTED Final    Comment: Performed at [facility], [address] [place], [place] [address]  MRSA PCR Screening     Status: None   Collection Time: 04/25/20 10:00 PM    Specimen: Nasal Mucosa; Nasopharyngeal  Result Value Ref Range Status   MRSA by PCR NEGATIVE NEGATIVE Final    Comment:        The GeneXpert MRSA Assay (FDA approved for NASAL specimens only), is one component of a comprehensive MRSA colonization surveillance program. It is not intended to diagnose MRSA infection nor to guide or monitor treatment for MRSA infections. Performed at [facility], [address] [place], [place] [address]   C Difficile Quick Screen (NO PCR Reflex)     Status: None   Collection Time: 04/28/20  7:30 AM   Specimen: Rectum; Stool  Result Value Ref Range Status   C Diff antigen NEGATIVE NEGATIVE Final   C Diff toxin NEGATIVE NEGATIVE Final   C Diff interpretation No C. difficile detected.  Final    Comment: Performed at [facility], [address], [place],  [address]         Radiology Studies: No results found.      Scheduled Meds: . allopurinol  300 mg Oral Daily  . amiodarone  200 mg Oral Daily  . vitamin C  500 mg Oral Daily  . aspirin EC  81 mg Oral Daily  . atorvastatin  80 mg Oral Daily  . benzonatate  200 mg Oral TID  . Chlorhexidine Gluconate Cloth  6 each Topical Daily  . enoxaparin (LOVENOX) injection  70 mg Subcutaneous Q24H  . famotidine  20 mg Oral BID  . insulin aspart  0-5 Units Subcutaneous QHS  . insulin aspart  0-9 Units Subcutaneous TID WC  . insulin detemir  15 Units Subcutaneous BID  . Ipratropium-Albuterol  1 puff Inhalation BID  . lactulose  20 g Oral TID  . methylPREDNISolone (SOLU-MEDROL) injection  125 mg Intravenous Q12H  . metoprolol tartrate  12.5 mg Oral BID  . multivitamin with minerals  1 tablet Oral Daily  . tamsulosin  0.4 mg Oral QPC supper  . trimethoprim-polymyxin b  1 drop Both Eyes Q6H  . zinc sulfate  220 mg Oral Daily   Continuous Infusions: . sodium chloride Stopped (04/25/20 1659)  . cefTRIAXone (ROCEPHIN)  IV 2 g (04/27/20 1200)     LOS: 8 days    Time spent: 35  minutes    Dawnna Gritz Darleen Crocker, DO [facility]  If 7PM-7AM, please contact night-coverage [email or web address] 04/28/2020, 10:36 AM

## 2020-04-28 NOTE — Care Management Important Message (Signed)
Important Message  Patient Details  Name: Tim Duncan MRN: [id number] Date of Birth: [birth year]   Medicare Important Message Given:  Yes - Important Message mailed due to current National Emergency     [name] 04/28/2020, 4:59 PM

## 2020-04-29 DIAGNOSIS — J9621 Acute and chronic respiratory failure with hypoxia: Secondary | ICD-10-CM | POA: Diagnosis not present

## 2020-04-29 LAB — CBC WITH DIFFERENTIAL/PLATELET
Abs Immature Granulocytes: 0 10*3/uL (ref 0.00–0.07)
Band Neutrophils: 0 %
Basophils Absolute: 0 10*3/uL (ref 0.0–0.1)
Basophils Relative: 0 %
Blasts: 21 %
Eosinophils Absolute: 0 10*3/uL (ref 0.0–0.5)
Eosinophils Relative: 0 %
HCT: 29.2 % — ABNORMAL LOW (ref 39.0–52.0)
Hemoglobin: 8.4 g/dL — ABNORMAL LOW (ref 13.0–17.0)
Immature Granulocytes: 0 %
Lymphocytes Relative: 69 %
Lymphs Abs: 82.4 10*3/uL — ABNORMAL HIGH (ref 0.7–4.0)
MCH: 30.8 pg (ref 26.0–34.0)
MCHC: 28.8 g/dL — ABNORMAL LOW (ref 30.0–36.0)
MCV: 107 fL — ABNORMAL HIGH (ref 80.0–100.0)
Metamyelocytes Relative: 0 %
Monocytes Absolute: 1.2 10*3/uL — ABNORMAL HIGH (ref 0.1–1.0)
Monocytes Relative: 1 %
Myelocytes: 0 %
Neutro Abs: 10.7 10*3/uL — ABNORMAL HIGH (ref 1.7–7.7)
Neutrophils Relative %: 9 %
Other: 0 %
Platelets: 134 10*3/uL — ABNORMAL LOW (ref 150–400)
Promyelocytes Relative: 0 %
RBC: 2.73 MIL/uL — ABNORMAL LOW (ref 4.22–5.81)
RDW: 16.8 % — ABNORMAL HIGH (ref 11.5–15.5)
WBC: 119.4 10*3/uL (ref 4.0–10.5)
nRBC: 0 % (ref 0.0–0.2)
nRBC: 0 /100 WBC

## 2020-04-29 LAB — GASTROINTESTINAL PANEL BY PCR, STOOL (REPLACES STOOL CULTURE)

## 2020-04-29 LAB — COMPREHENSIVE METABOLIC PANEL
ALT: 29 U/L (ref 0–44)
AST: 38 U/L (ref 15–41)
Albumin: 2.1 g/dL — ABNORMAL LOW (ref 3.5–5.0)
Alkaline Phosphatase: 60 U/L (ref 38–126)
Anion gap: 10 (ref 5–15)
BUN: 47 mg/dL — ABNORMAL HIGH (ref 8–23)
CO2: 24 mmol/L (ref 22–32)
Calcium: 7.6 mg/dL — ABNORMAL LOW (ref 8.9–10.3)
Chloride: 107 mmol/L (ref 98–111)
Creatinine, Ser: 1.66 mg/dL — ABNORMAL HIGH (ref 0.61–1.24)
GFR, Estimated: 45 mL/min — ABNORMAL LOW (ref >60)
Glucose, Bld: 173 mg/dL — ABNORMAL HIGH (ref 70–99)
Potassium: 3.9 mmol/L (ref 3.5–5.1)
Sodium: 141 mmol/L (ref 135–145)
Total Bilirubin: 0.8 mg/dL (ref 0.3–1.2)
Total Protein: 6.2 g/dL — ABNORMAL LOW (ref 6.5–8.1)

## 2020-04-29 LAB — GLUCOSE, CAPILLARY
Glucose-Capillary: 195 mg/dL — ABNORMAL HIGH (ref 70–99)
Glucose-Capillary: 180 mg/dL — ABNORMAL HIGH (ref 70–99)
Glucose-Capillary: 215 mg/dL — ABNORMAL HIGH (ref 70–99)
Glucose-Capillary: 256 mg/dL — ABNORMAL HIGH (ref 70–99)

## 2020-04-29 LAB — PROCALCITONIN: Procalcitonin: 2.83 ng/mL

## 2020-04-29 LAB — MAGNESIUM: Magnesium: 3.3 mg/dL — ABNORMAL HIGH (ref 1.7–2.4)

## 2020-04-29 LAB — AMMONIA: Ammonia: 39 umol/L — ABNORMAL HIGH (ref 9–35)

## 2020-04-29 LAB — FERRITIN: Ferritin: 2056 ng/mL — ABNORMAL HIGH (ref 24–336)

## 2020-04-29 LAB — C-REACTIVE PROTEIN: CRP: 18 mg/dL — ABNORMAL HIGH (ref <1.0)

## 2020-04-29 MED ORDER — MORPHINE SULFATE (PF) 2 MG/ML IV SOLN
2.0000 mg | Freq: Once | INTRAVENOUS | Status: AC
  Administered 2020-04-30: 2 mg via INTRAVENOUS
  Filled 2020-04-29: qty 1

## 2020-04-29 MED ORDER — LIDOCAINE 5 % EX PTCH
1.0000 | MEDICATED_PATCH | CUTANEOUS | Status: AC
  Administered 2020-04-30: 1 via TRANSDERMAL
  Filled 2020-04-29 (×2): qty 1

## 2020-04-29 NOTE — Progress Notes (Signed)
PROGRESS NOTE    Tim Duncan  [id number] DOB: [birth year] DOA: 04/09/2020 PCP: Celene Squibb, MD   Brief Narrative:  Tim Duncan a 70 y.o.malewith medical history significant ofhypertension, BPH, atrial fibrillation (not chronically on anticoagulation), chronic lymphocytic leukemia (actively follow by oncology service at [facility]), hyperlipidemia, morbid obesity and diabetes; who presented to the emergency department secondary to general malaise, shortness of breath and intermittent coughing spells. Patient symptoms have been present for the last 2 days or so and was tested and found to be positive for COVID. He is a skilled nursing facility oxygen saturation on room air was around 75%. Patient reports chronic diarrhea, no nausea or vomiting. Expressed having chills. Nonproductive coughing spells and shortness of breath at rest on exertion. No hemoptysis. Of note, patient expressed to be vaccinated with Wynetta Emery & Wynetta Emery vaccine in the past.   ED Course:Inflammatory markers corroborating acute COVID infection; bilateral infiltrates appreciated on all his lungs characteristic for COVID. IV steroids and remdesivir's initiated. [facility] has been contacted to place patient in the hospital for acute respiratory failure with hypoxia in the setting of COVID-pneumonia. Patient is requiring 4 L nasal cannula supplementation.   Assessment & Plan:   Active Problems:   Acute on chronic respiratory failure with hypoxia (HCC)   Acute on chronic hypoxemic respiratory failure -Currently on 10 L nasal cannula oxygen -Continue to follow inflammatory markers -Completedremdesivir infusion 04/24/20. -Baricitinib discontinued 1/27 -Resumethe use of IV steroids, restarted 1/27 -Continue to follow vitamin C and zinc -Will continue as needed bronchodilator, proning position, incentive spirometer and flutter valve -Follow clinical response and continue supportive care. -Continue as needed  antitussive medications. -Patient willremain in the stepdown unit; incase he endedrequiringheated high flow oxygen supplementation.  Acute encephalopathy-multifactorial, improving -Appears to be related to hepatic encephalopathy with some mild elevation in ammonia levels for which lactulose started, follow trend -TSH within normal limits -Likely related to critical illness  Strep pneumonia bacteremia with ongoing fevers -Currently on full dose Rocephin for treatment -Procalcitonin downtrending  E. coli and Proteus UTI -Sensitive to Rocephin which will be continued for now  Ongoing diarrhea-improving -C. difficile negative and GI panel pending  History of paroxysmal atrial fibrillation -Sinus rhythm and rate controlled -Continue amiodarone and beta-blocker.  CLL -WBCs in the100s range currently -Continue to follow trend and continue to monitor procalcitonin for better guidance which has been downtrending -Case discussed with oncology service (Dr. Delton Coombes) and recommendations given toholdvenetoclax while acutely ill; may resume on dc  Type 2 diabetes -Continue sliding scale insulin and Levemir -Follow CBGs and adjust hypoglycemic regimen as needed.  Gastroesophageal reflux disease/GI prophylaxis -Change PPI to H2 blocker  Essential hypertension -Currently stable and well-controlled -Followed by -Continue adjusted dose of antihypertensive regimen.  Acute kidney injury -Urine output 580, continue LR at highter rate -In the setting of dehydration and prerenal azotemia -UA not demonstrating acute infection. -Continue to minimize the use of nephrotoxic agents and avoid hypotension -Maintain adequate hydration -Renal functionstable  Hyperlipidemia -Continue statins.  History of BPH -Continue Flomax.  Morbid obesity -Body mass index is 42.01 kg/m. -Low-calorie diet, portion control increase physical activity discussed with patient.   DVT  prophylaxis:Lovenox Code Status:Full code. Family Communication:Discussed with friend who is the power of attorney on phone 1/29 Disposition:  Status is: Inpatient  Dispo: The patient is from: Skilled nursing facility Anticipated d/c is to: Skilled nursing facilityvs home hospice/facility Anticipated d/c date is: To be determined Patient currentlynotmedically stable for discharge; still requiring  oxygen supplementation. Complaining of feeling short winded and receiving IV therapy. Requiring 10 L nasal cannula supplementation and nonrebreather currently. Now experiencing high-grade temperature and is having next rate with newright upper lobe consolidation.   Consultants:  Oncology service curbside: Dr. Delton Coombes.  Palliative care   Procedures: See below for x-ray reports  Antimicrobials:  Anti-infectives (From admission, onward)   Start     Dose/Rate Route Frequency Ordered Stop   04/26/20 1200  cefTRIAXone (ROCEPHIN) 2 g in sodium chloride 0.9 % 100 mL IVPB        2 g 200 mL/hr over 30 Minutes Intravenous Every 24 hours 04/26/20 0848     04/25/20 1630  piperacillin-tazobactam (ZOSYN) IVPB 3.375 g  Status:  Discontinued        3.375 g 12.5 mL/hr over 240 Minutes Intravenous Every 8 hours 04/25/20 1551 04/26/20 0848   04/21/20 1000  remdesivir 100 mg in sodium chloride 0.9 % 100 mL IVPB        100 mg 200 mL/hr over 30 Minutes Intravenous Daily 04/26/2020 1108 04/24/20 1835   04/21/20 1000  remdesivir 100 mg in sodium chloride 0.9 % 100 mL IVPB  Status:  Discontinued       "Followed by" Linked Group Details   100 mg 200 mL/hr over 30 Minutes Intravenous Daily 04/22/2020 1139 04/16/2020 1147   04/27/2020 1200  remdesivir 100 mg in sodium chloride 0.9 % 100 mL IVPB        100 mg 200 mL/hr over 30 Minutes Intravenous Every 1 hr x 2 04/24/2020 1108 04/14/2020 1315   04/15/2020 1145  remdesivir 200 mg in sodium chloride 0.9% 250 mL IVPB   Status:  Discontinued       "Followed by" Linked Group Details   200 mg 580 mL/hr over 30 Minutes Intravenous Once 04/01/2020 1139 04/17/2020 1147   04/08/2020 1141  valACYclovir (VALTREX) tablet 1,000 mg        1,000 mg Oral Daily PRN 04/13/2020 1142         Subjective: Patient seen and evaluated today with no new acute complaints or concerns. No acute concerns or events noted overnight.  Patient is more alert and awake and is having formed bowel movements.  Objective: Vitals:   04/29/20 1100 04/29/20 1101 04/29/20 1200 04/29/20 1300  BP:  (!) 132/58 (!) 139/53 (!) 128/52  Pulse: 77 79 78 72  Resp: (!) 25 (!) 23    Temp:  (!) 100.6 F (38.1 C)    TempSrc:  Axillary    SpO2: (!) 88% (!) 88% (!) 87% 90%  Weight:      Height:        Intake/Output Summary (Last 24 hours) at 04/29/2020 1349 Last data filed at 04/29/2020 0400 Gross per 24 hour  Intake 1381.08 ml  Output 580 ml  Net 801.08 ml   Filed Weights   04/28/2020 0656 04/27/20 0400  Weight: (!) 140.5 kg 135.8 kg    Examination:  General exam: Appears calm and comfortable, obese Respiratory system: Clear to auscultation. Respiratory effort normal.  Currently on 10L . Cardiovascular system: S1 & S2 heard, RRR.  Gastrointestinal system: Abdomen is soft Central nervous system: Alert and awake Extremities: No edema Skin: No significant lesions noted Psychiatry: Flat affect.    Data Reviewed: I have personally reviewed following labs and imaging studies  CBC: Recent Labs  Lab 04/24/20 0454 04/25/20 0352 04/27/20 0430 04/28/20 0621 04/29/20 0639  WBC 86.4* 67.4* 98.9* 110.8* 119.4*  NEUTROABS 8.6* 4.8  14.8* 12.2* 10.7*  HGB 9.5* 9.3* 8.3* 9.6* 8.4*  HCT 31.6* 31.0* 28.6* 32.9* 29.2*  MCV 103.6* 103.3* 104.8* 107.5* 107.0*  PLT 160 139* 152 143* [id number]*   Basic Metabolic Panel: Recent Labs  Lab 04/23/20 0730 04/24/20 0454 04/25/20 0352 04/27/20 0430 04/28/20 0621 04/29/20 0639  NA 138 140 142 141 143 141  K  4.1 4.2 4.2 3.2* 4.0 3.9  CL 107 106 106 106 106 107  CO2 23 24 27 26 24 24   GLUCOSE 218* 143* 147* 54* 323* 173*  BUN 20 20 16 19  29* 47*  CREATININE 1.01 1.09 1.04 1.15 1.40* 1.66*  CALCIUM 7.1* 7.3* 7.1* 7.1* 7.7* 7.6*  MG 2.2 2.6* 2.5* 2.9*  --  3.3*  PHOS 2.9 2.8 3.4  --   --   --    GFR: Estimated Creatinine Clearance: 60.8 mL/min (A) (by C-G formula based on SCr of 1.66 mg/dL (H)). Liver Function Tests: Recent Labs  Lab 04/24/20 0454 04/25/20 0352 04/27/20 0430 04/28/20 0621 04/29/20 0639  AST 22 28 45* 44* 38  ALT 21 21 23 29 29   ALKPHOS 43 45 65 72 60  BILITOT 0.7 0.9 0.6 0.7 0.8  PROT 5.7* 5.5* 5.9* 6.9 6.2*  ALBUMIN 2.4* 2.2* 2.1* 2.4* 2.1*   No results for input(s): LIPASE, AMYLASE in the last 168 hours. Recent Labs  Lab 04/26/20 1450 04/27/20 0430 04/29/20 0639  AMMONIA 47* 54* 39*   Coagulation Profile: No results for input(s): INR, PROTIME in the last 168 hours. Cardiac Enzymes: No results for input(s): CKTOTAL, CKMB, CKMBINDEX, TROPONINI in the last 168 hours. BNP (last 3 results) No results for input(s): PROBNP in the last 8760 hours. HbA1C: No results for input(s): HGBA1C in the last 72 hours. CBG: Recent Labs  Lab 04/28/20 1145 04/28/20 1639 04/28/20 1947 04/29/20 0802 04/29/20 1100  GLUCAP 277* 243* 257* 195* 180*   Lipid Profile: No results for input(s): CHOL, HDL, LDLCALC, TRIG, CHOLHDL, LDLDIRECT in the last 72 hours. Thyroid Function Tests: Recent Labs    04/26/20 1456  TSH 2.697   Anemia Panel: Recent Labs    04/28/20 0621 04/29/20 0639  FERRITIN 2,862* 2,056*   Sepsis Labs: Recent Labs  Lab 04/26/20 1456 04/27/20 0430 04/28/20 0621 04/29/20 0639  PROCALCITON 8.91 6.86 4.54 2.83    Recent Results (from the past 240 hour(s))  Blood Culture (routine x 2)     Status: None   Collection Time: 04/13/2020  8:02 AM   Specimen: BLOOD RIGHT HAND  Result Value Ref Range Status   Specimen Description BLOOD RIGHT HAND  Final    Special Requests   Final    BOTTLES DRAWN AEROBIC AND ANAEROBIC Blood Culture adequate volume   Culture   Final    NO GROWTH 5 DAYS Performed at [facility], [address] [place], [place] [address]    Report Status 04/25/2020 FINAL  Final  Resp Panel by RT-PCR (Flu A&B, Covid) Nasopharyngeal Swab     Status: Abnormal   Collection Time: 04/03/2020  8:05 AM   Specimen: Nasopharyngeal Swab; Nasopharyngeal(NP) swabs in vial transport medium  Result Value Ref Range Status   SARS Coronavirus 2 by RT PCR POSITIVE (A) NEGATIVE Final    Comment: RESULT CALLED TO, READ BACK BY AND VERIFIED WITH: MARTIN,D. RN @1058  04/27/2020 BILLINGSLEY,L (NOTE) SARS-CoV-2 target nucleic acids are DETECTED.  The SARS-CoV-2 RNA is generally detectable in upper respiratory specimens during the acute phase of infection. Positive results are indicative of the  presence of the identified virus, but do not rule out bacterial infection or co-infection with other pathogens not detected by the test. Clinical correlation with patient history and other diagnostic information is necessary to determine patient infection status. The expected result is Negative.  Fact Sheet for Patients: EntrepreneurPulse.com.au  Fact Sheet for Healthcare Providers: IncredibleEmployment.be  This test is not yet approved or cleared by the [place] FDA and  has been authorized for detection and/or diagnosis of SARS-CoV-2 by FDA under an Emergency Use Authorization (EUA).  This EUA will remain in effect (meaning this test  can be used) for the duration of  the COVID-19 declaration under Section 564(b)(1) of the Act, 21 U.S.C. section 360bbb-3(b)(1), unless the authorization is terminated or revoked sooner.     Influenza A by PCR NEGATIVE NEGATIVE Final   Influenza B by PCR NEGATIVE NEGATIVE Final    Comment: (NOTE) The Xpert Xpress SARS-CoV-2/FLU/RSV plus assay is intended as an aid in the  diagnosis of influenza from Nasopharyngeal swab specimens and should not be used as a sole basis for treatment. Nasal washings and aspirates are unacceptable for Xpert Xpress SARS-CoV-2/FLU/RSV testing.  Fact Sheet for Patients: EntrepreneurPulse.com.au  Fact Sheet for Healthcare Providers: IncredibleEmployment.be  This test is not yet approved or cleared by the [place] FDA and has been authorized for detection and/or diagnosis of SARS-CoV-2 by FDA under an Emergency Use Authorization (EUA). This EUA will remain in effect (meaning this test can be used) for the duration of the COVID-19 declaration under Section 564(b)(1) of the Act, 21 U.S.C. section 360bbb-3(b)(1), unless the authorization is terminated or revoked.  Performed at [facility], [address], [place], [place] [address]   Blood Culture (routine x 2)     Status: None   Collection Time: 04/01/2020  9:04 AM   Specimen: BLOOD  Result Value Ref Range Status   Specimen Description BLOOD LEFT ANTECUBITAL  Final   Special Requests   Final    BOTTLES DRAWN AEROBIC AND ANAEROBIC Blood Culture adequate volume   Culture   Final    NO GROWTH 6 DAYS Performed at [facility], [address], [place], [place] [address]    Report Status 04/26/2020 FINAL  Final  Urine Culture     Status: Abnormal   Collection Time: 04/25/20 11:40 AM   Specimen: Urine, Clean Catch  Result Value Ref Range Status   Specimen Description   Final    URINE, CLEAN CATCH Performed at [facility], [address], [place], [place] [address]    Special Requests   Final    NONE Performed at [facility], [address], [place], [place] [address]    Culture (A)  Final    >=100,000 COLONIES/mL ESCHERICHIA COLI 50,000 COLONIES/mL PROTEUS MIRABILIS    Report Status 04/28/2020 FINAL  Final   Organism ID, Bacteria ESCHERICHIA COLI (A)  Final   Organism ID, Bacteria PROTEUS MIRABILIS (A)  Final      Susceptibility    Escherichia coli - MIC*    AMPICILLIN 4 SENSITIVE Sensitive     CEFAZOLIN <=4 SENSITIVE Sensitive     CEFEPIME <=0.12 SENSITIVE Sensitive     CEFTRIAXONE <=0.25 SENSITIVE Sensitive     CIPROFLOXACIN <=0.25 SENSITIVE Sensitive     GENTAMICIN <=1 SENSITIVE Sensitive     IMIPENEM <=0.25 SENSITIVE Sensitive     NITROFURANTOIN <=16 SENSITIVE Sensitive     TRIMETH/SULFA <=20 SENSITIVE Sensitive     AMPICILLIN/SULBACTAM <=2 SENSITIVE Sensitive  PIP/TAZO <=4 SENSITIVE Sensitive     * >=100,000 COLONIES/mL ESCHERICHIA COLI   Proteus mirabilis - MIC*    AMPICILLIN <=2 SENSITIVE Sensitive     CEFAZOLIN <=4 SENSITIVE Sensitive     CEFEPIME <=0.12 SENSITIVE Sensitive     CEFTRIAXONE <=0.25 SENSITIVE Sensitive     CIPROFLOXACIN <=0.25 SENSITIVE Sensitive     GENTAMICIN <=1 SENSITIVE Sensitive     IMIPENEM 2 SENSITIVE Sensitive     NITROFURANTOIN 128 RESISTANT Resistant     TRIMETH/SULFA <=20 SENSITIVE Sensitive     AMPICILLIN/SULBACTAM <=2 SENSITIVE Sensitive     PIP/TAZO <=4 SENSITIVE Sensitive     * 50,000 COLONIES/mL PROTEUS MIRABILIS  Culture, blood (Routine X 2) w Reflex to ID Panel     Status: Abnormal   Collection Time: 04/25/20 12:46 PM   Specimen: BLOOD  Result Value Ref Range Status   Specimen Description   Final    BLOOD LEFT ANTECUBITAL Performed at [facility], [address], [place], [place] [address]    Special Requests   Final    Blood Culture adequate volume BOTTLES DRAWN AEROBIC AND ANAEROBIC Performed at [facility], [address] [place], [place] [address]    Culture  Setup Time   Final    IN BOTH AEROBIC AND ANAEROBIC BOTTLES GRAM POSITIVE COCCI IN PAIRS Gram Stain Report Called to,Read Back By and Verified With: J HEARN,RN@0016  04/26/20 MKELLY CRITICAL VALUE NOTED.  VALUE IS CONSISTENT WITH PREVIOUSLY REPORTED AND CALLED VALUE.    Culture (A)  Final    STREPTOCOCCUS PNEUMONIAE SUSCEPTIBILITIES PERFORMED ON PREVIOUS CULTURE WITHIN THE LAST 5  DAYS. Performed at [facility], [place] [address] [place], [place] [address]    Report Status 04/28/2020 FINAL  Final  Culture, blood (Routine X 2) w Reflex to ID Panel     Status: Abnormal   Collection Time: 04/25/20 12:47 PM   Specimen: BLOOD  Result Value Ref Range Status   Specimen Description   Final    BLOOD LEFT ANTECUBITAL Performed at [facility], [address], [place], [place] [address]    Special Requests   Final    BOTTLES DRAWN AEROBIC AND ANAEROBIC Blood Culture results may not be optimal due to an inadequate volume of blood received in culture bottles Performed at [facility], [address] [place], [place] [address]    Culture  Setup Time   Final    AEROBIC BOTTLE ONLY GRAM POSITIVE COCCI IN PAIRS Gram Stain Report Called to,Read Back By and Verified With: J HEARN,RN 0016 04/26/20 MKELLY CRITICAL RESULT CALLED TO, READ BACK BY AND VERIFIED WITH: J,HEARN RN @0504  04/26/20 EB Performed at [facility], [place] [address] [place],  [address]    Culture STREPTOCOCCUS PNEUMONIAE (A)  Final   Report Status 04/28/2020 FINAL  Final   Organism ID, Bacteria STREPTOCOCCUS PNEUMONIAE  Final      Susceptibility   Streptococcus pneumoniae - MIC*    ERYTHROMYCIN <=0.12 SENSITIVE Sensitive     LEVOFLOXACIN 0.5 SENSITIVE Sensitive     VANCOMYCIN 0.5 SENSITIVE Sensitive     PENO - penicillin <=0.06      PENICILLIN (non-meningitis) <=0.06 SENSITIVE Sensitive     PENICILLIN (oral) <=0.06 SENSITIVE Sensitive     CEFTRIAXONE (non-meningitis) <=0.12 SENSITIVE Sensitive     * STREPTOCOCCUS PNEUMONIAE  Blood Culture ID Panel (Reflexed)     Status: Abnormal   Collection Time: 04/25/20 12:47 PM  Result Value Ref Range Status   Enterococcus faecalis  NOT DETECTED NOT DETECTED Final   Enterococcus Faecium NOT DETECTED NOT DETECTED Final   Listeria monocytogenes NOT DETECTED NOT DETECTED Final   Staphylococcus species NOT DETECTED NOT DETECTED Final   Staphylococcus  aureus (BCID) NOT DETECTED NOT DETECTED Final   Staphylococcus epidermidis NOT DETECTED NOT DETECTED Final   Staphylococcus lugdunensis NOT DETECTED NOT DETECTED Final   Streptococcus species DETECTED (A) NOT DETECTED Final    Comment: CRITICAL RESULT CALLED TO, READ BACK BY AND VERIFIED WITH: J,HEARN RN @0504  04/26/20 EB    Streptococcus agalactiae NOT DETECTED NOT DETECTED Final   Streptococcus pneumoniae DETECTED (A) NOT DETECTED Final    Comment: CRITICAL RESULT CALLED TO, READ BACK BY AND VERIFIED WITH: J,HEARN RN @0504  04/26/20 EB    Streptococcus pyogenes NOT DETECTED NOT DETECTED Final   A.calcoaceticus-baumannii NOT DETECTED NOT DETECTED Final   Bacteroides fragilis NOT DETECTED NOT DETECTED Final   Enterobacterales NOT DETECTED NOT DETECTED Final   Enterobacter cloacae complex NOT DETECTED NOT DETECTED Final   Escherichia coli NOT DETECTED NOT DETECTED Final   Klebsiella aerogenes NOT DETECTED NOT DETECTED Final   Klebsiella oxytoca NOT DETECTED NOT DETECTED Final   Klebsiella pneumoniae NOT DETECTED NOT DETECTED Final   Proteus species NOT DETECTED NOT DETECTED Final   Salmonella species NOT DETECTED NOT DETECTED Final   Serratia marcescens NOT DETECTED NOT DETECTED Final   Haemophilus influenzae NOT DETECTED NOT DETECTED Final   Neisseria meningitidis NOT DETECTED NOT DETECTED Final   Pseudomonas aeruginosa NOT DETECTED NOT DETECTED Final   Stenotrophomonas maltophilia NOT DETECTED NOT DETECTED Final   Candida albicans NOT DETECTED NOT DETECTED Final   Candida auris NOT DETECTED NOT DETECTED Final   Candida glabrata NOT DETECTED NOT DETECTED Final   Candida krusei NOT DETECTED NOT DETECTED Final   Candida parapsilosis NOT DETECTED NOT DETECTED Final   Candida tropicalis NOT DETECTED NOT DETECTED Final   Cryptococcus neoformans/gattii NOT DETECTED NOT DETECTED Final    Comment: Performed at [facility], [address] [place], [place] [address]  MRSA PCR Screening      Status: None   Collection Time: 04/25/20 10:00 PM   Specimen: Nasal Mucosa; Nasopharyngeal  Result Value Ref Range Status   MRSA by PCR NEGATIVE NEGATIVE Final    Comment:        The GeneXpert MRSA Assay (FDA approved for NASAL specimens only), is one component of a comprehensive MRSA colonization surveillance program. It is not intended to diagnose MRSA infection nor to guide or monitor treatment for MRSA infections. Performed at [facility], [address], [place], [place] [address]   Gastrointestinal Panel by PCR , Stool     Status: None   Collection Time: 04/28/20  7:30 AM   Specimen: Rectum; Stool  Result Value Ref Range Status   Campylobacter species NOT DETECTED NOT DETECTED Final   Plesimonas shigelloides NOT DETECTED NOT DETECTED Final   Salmonella species NOT DETECTED NOT DETECTED Final   Yersinia enterocolitica NOT DETECTED NOT DETECTED Final   Vibrio species NOT DETECTED NOT DETECTED Final   Vibrio cholerae NOT DETECTED NOT DETECTED Final   Enteroaggregative E coli (EAEC) NOT DETECTED NOT DETECTED Final   Enteropathogenic E coli (EPEC) NOT DETECTED NOT DETECTED Final   Enterotoxigenic E coli (ETEC) NOT DETECTED NOT DETECTED Final   Shiga like toxin producing E coli (STEC) NOT DETECTED NOT DETECTED Final   Shigella/Enteroinvasive E coli (EIEC) NOT DETECTED NOT DETECTED Final   Cryptosporidium NOT DETECTED NOT DETECTED  Final   Cyclospora cayetanensis NOT DETECTED NOT DETECTED Final   Entamoeba histolytica NOT DETECTED NOT DETECTED Final   Giardia lamblia NOT DETECTED NOT DETECTED Final   Adenovirus F40/41 NOT DETECTED NOT DETECTED Final   Astrovirus NOT DETECTED NOT DETECTED Final   Norovirus GI/GII NOT DETECTED NOT DETECTED Final   Rotavirus A NOT DETECTED NOT DETECTED Final   Sapovirus (I, II, IV, and V) NOT DETECTED NOT DETECTED Final    Comment: Performed at [facility], [place], [place] [address]  C Difficile Quick Screen (NO  PCR Reflex)     Status: None   Collection Time: 04/28/20  7:30 AM   Specimen: Rectum; Stool  Result Value Ref Range Status   C Diff antigen NEGATIVE NEGATIVE Final   C Diff toxin NEGATIVE NEGATIVE Final   C Diff interpretation No C. difficile detected.  Final    Comment: Performed at [facility], [address], [place], [place] [address]         Radiology Studies: No results found.      Scheduled Meds: . allopurinol  300 mg Oral Daily  . amiodarone  200 mg Oral Daily  . vitamin C  500 mg Oral Daily  . aspirin EC  81 mg Oral Daily  . atorvastatin  80 mg Oral Daily  . benzonatate  200 mg Oral TID  . Chlorhexidine Gluconate Cloth  6 each Topical Daily  . enoxaparin (LOVENOX) injection  70 mg Subcutaneous Q24H  . famotidine  20 mg Oral BID  . insulin aspart  0-5 Units Subcutaneous QHS  . insulin aspart  0-9 Units Subcutaneous TID WC  . insulin detemir  15 Units Subcutaneous BID  . Ipratropium-Albuterol  1 puff Inhalation BID  . lactulose  20 g Oral TID  . methylPREDNISolone (SOLU-MEDROL) injection  125 mg Intravenous Q12H  . metoprolol tartrate  12.5 mg Oral BID  . multivitamin with minerals  1 tablet Oral Daily  . tamsulosin  0.4 mg Oral QPC supper  . trimethoprim-polymyxin b  1 drop Both Eyes Q6H  . zinc sulfate  220 mg Oral Daily   Continuous Infusions: . sodium chloride Stopped (04/25/20 1659)  . cefTRIAXone (ROCEPHIN)  IV 2 g (04/29/20 1145)  . lactated ringers 75 mL/hr at 04/28/20 2301     LOS: 9 days    Time spent: 35 minutes    Lilyannah Zuelke Darleen Crocker, DO [facility]  If 7PM-7AM, please contact night-coverage [email or web address] 04/29/2020, 1:49 PM

## 2020-04-29 NOTE — Progress Notes (Signed)
Patient pulling off oxygen, mitts applied

## 2020-04-30 ENCOUNTER — Inpatient Hospital Stay (HOSPITAL_COMMUNITY): Payer: Medicare (Managed Care)

## 2020-04-30 DIAGNOSIS — J9621 Acute and chronic respiratory failure with hypoxia: Secondary | ICD-10-CM | POA: Diagnosis not present

## 2020-04-30 LAB — CBC WITH DIFFERENTIAL/PLATELET
Basophils Absolute: 0 10*3/uL (ref 0.0–0.1)
Basophils Relative: 0 %
Blasts: 22 %
Eosinophils Absolute: 0 10*3/uL (ref 0.0–0.5)
Eosinophils Relative: 0 %
HCT: 28.6 % — ABNORMAL LOW (ref 39.0–52.0)
Hemoglobin: 8 g/dL — ABNORMAL LOW (ref 13.0–17.0)
Lymphocytes Relative: 70 %
Lymphs Abs: 75.7 10*3/uL — ABNORMAL HIGH (ref 0.7–4.0)
MCH: 30.2 pg (ref 26.0–34.0)
MCHC: 28 g/dL — ABNORMAL LOW (ref 30.0–36.0)
MCV: 107.9 fL — ABNORMAL HIGH (ref 80.0–100.0)
Monocytes Absolute: 0 10*3/uL — ABNORMAL LOW (ref 0.1–1.0)
Monocytes Relative: 0 %
Neutro Abs: 8.6 10*3/uL — ABNORMAL HIGH (ref 1.7–7.7)
Neutrophils Relative %: 8 %
Platelets: 120 10*3/uL — ABNORMAL LOW (ref 150–400)
RBC: 2.65 MIL/uL — ABNORMAL LOW (ref 4.22–5.81)
RDW: 16.7 % — ABNORMAL HIGH (ref 11.5–15.5)
WBC: 108.1 10*3/uL (ref 4.0–10.5)
nRBC: 0 % (ref 0.0–0.2)

## 2020-04-30 LAB — COMPREHENSIVE METABOLIC PANEL
ALT: 29 U/L (ref 0–44)
AST: 38 U/L (ref 15–41)
Albumin: 1.9 g/dL — ABNORMAL LOW (ref 3.5–5.0)
Alkaline Phosphatase: 55 U/L (ref 38–126)
Anion gap: 8 (ref 5–15)
BUN: 52 mg/dL — ABNORMAL HIGH (ref 8–23)
CO2: 26 mmol/L (ref 22–32)
Calcium: 7.7 mg/dL — ABNORMAL LOW (ref 8.9–10.3)
Chloride: 108 mmol/L (ref 98–111)
Creatinine, Ser: 1.59 mg/dL — ABNORMAL HIGH (ref 0.61–1.24)
GFR, Estimated: 47 mL/min — ABNORMAL LOW (ref >60)
Glucose, Bld: 329 mg/dL — ABNORMAL HIGH (ref 70–99)
Potassium: 4.5 mmol/L (ref 3.5–5.1)
Sodium: 142 mmol/L (ref 135–145)
Total Bilirubin: 0.8 mg/dL (ref 0.3–1.2)
Total Protein: 5.7 g/dL — ABNORMAL LOW (ref 6.5–8.1)

## 2020-04-30 LAB — BRAIN NATRIURETIC PEPTIDE: B Natriuretic Peptide: 420 pg/mL — ABNORMAL HIGH (ref 0.0–100.0)

## 2020-04-30 LAB — PROCALCITONIN: Procalcitonin: 1.08 ng/mL

## 2020-04-30 LAB — GLUCOSE, CAPILLARY
Glucose-Capillary: 304 mg/dL — ABNORMAL HIGH (ref 70–99)
Glucose-Capillary: 295 mg/dL — ABNORMAL HIGH (ref 70–99)
Glucose-Capillary: 246 mg/dL — ABNORMAL HIGH (ref 70–99)
Glucose-Capillary: 241 mg/dL — ABNORMAL HIGH (ref 70–99)

## 2020-04-30 LAB — AMMONIA: Ammonia: 35 umol/L (ref 9–35)

## 2020-04-30 LAB — C-REACTIVE PROTEIN: CRP: 11 mg/dL — ABNORMAL HIGH (ref <1.0)

## 2020-04-30 LAB — MAGNESIUM: Magnesium: 3.5 mg/dL — ABNORMAL HIGH (ref 1.7–2.4)

## 2020-04-30 LAB — FERRITIN: Ferritin: 1762 ng/mL — ABNORMAL HIGH (ref 24–336)

## 2020-04-30 MED ORDER — INSULIN ASPART 100 UNIT/ML ~~LOC~~ SOLN
0.0000 [IU] | Freq: Three times a day (TID) | SUBCUTANEOUS | Status: DC
  Administered 2020-04-30 – 2020-05-01 (×2): 7 [IU] via SUBCUTANEOUS
  Administered 2020-05-01 (×2): 11 [IU] via SUBCUTANEOUS
  Administered 2020-05-02: 7 [IU] via SUBCUTANEOUS
  Administered 2020-05-02: 11 [IU] via SUBCUTANEOUS
  Administered 2020-05-02: 7 [IU] via SUBCUTANEOUS

## 2020-04-30 MED ORDER — SALINE SPRAY 0.65 % NA SOLN
1.0000 | NASAL | Status: DC | PRN
  Administered 2020-04-30 (×2): 1 via NASAL
  Filled 2020-04-30: qty 44

## 2020-04-30 MED ORDER — INSULIN ASPART 100 UNIT/ML ~~LOC~~ SOLN
0.0000 [IU] | Freq: Every day | SUBCUTANEOUS | Status: DC
  Administered 2020-04-30 – 2020-05-01 (×2): 2 [IU] via SUBCUTANEOUS

## 2020-04-30 NOTE — Progress Notes (Signed)
PROGRESS NOTE    Tim Duncan  WUJ:[id number] DOB: 08/09/1950 DOA: 04/29/2020 PCP: Benita StabileHall, John Z, MD   Brief Narrative:  Tim AlmaRonald Duncan Moyeris a 70 y.o.malewith medical history significant ofhypertension, BPH, atrial fibrillation (not chronically on anticoagulation), chronic lymphocytic leukemia (actively follow by [facility]ology [facility] at [facility]), hyperlipidemia, morbid obesity and diabetes; who presented to the emergency department secondary to general malaise, shortness of breath and intermittent coughing spells. Patient symptoms have been present for the last 2 days or so and was tested and found to be positive for COVID. He is a skilled nursing facility oxygen saturation on room air was around 75%. Patient reports chronic diarrhea, no nausea or vomiting. Expressed having chills. Nonproductive coughing spells and shortness of breath at rest on exertion. No hemoptysis. Of note, patient expressed to be vaccinated with Laural BenesJohnson & Laural BenesJohnson vaccine in the past.   ED Course:Inflammatory markers corroborating acute COVID infection; bilateral infiltrates appreciated on all his lungs characteristic for COVID. IV steroids and remdesivir's initiated. [facility] has been contacted to place patient in the hospital for acute respiratory failure with hypoxia in the setting of COVID-pneumonia. Patient was requiring 4 L nasal cannula supplementation.  Assessment & Plan:   Active Problems:   Acute on chronic respiratory failure with hypoxia (HCC)  Acute on chronic hypoxemic respiratory failure -Currently on 15 L nasal cannula oxygen with NRB mask -Continue to follow inflammatory markers -Completedremdesivir infusion 04/24/20. -Baricitinib discontinued 1/27 -Resumethe use of IV steroids, restarted 1/27 -Continue to follow vitamin C and zinc -Will continue as needed bronchodilator, proning position, incentive spirometer and flutter valve -Follow clinical response and continue supportive  care. -Continue as needed antitussive medications. -Patient willremain in the stepdown unit; incase he endedrequiringheated high flow oxygen supplementation.  Acute encephalopathy-multifactorial, improving -Appears to be related to hepatic encephalopathy with some mild elevation in ammonia levels for which lactulose started, follow trend -TSH within normal limits -Likely related to critical illness  Strep pneumonia bacteremia with ongoing fevers -Currently on full dose Rocephin for treatment -Procalcitonin downtrending  E. coli and Proteus UTI -Sensitive to Rocephin which will be continued for now  Ongoing diarrhea-improving -C. difficile negative and GI panel negative  History of paroxysmal atrial fibrillation -Sinus rhythm and rate controlled -Continue amiodarone and beta-blocker.  CLL -WBCs in the100s range currently -Continue to follow trendand continue to monitor procalcitonin for better guidance which has been downtrending -Case discussed with oncology service (Dr. Ellin SabaKatragadda) and recommendations given toholdvenetoclax while acutely ill; may resume on dc  Type 2 diabetes -Continue sliding scale insulin and Levemir -Follow CBGs and adjust hypoglycemic regimen as needed.  Gastroesophageal reflux disease/GI prophylaxis -Change PPI to H2 blocker  Essential hypertension -Currently stable and well-controlled -Followed by -Continue adjusted dose of antihypertensive regimen.  Acute kidney injury -Urine output 580, continue LR at highter rate -In the setting of dehydration and prerenal azotemia -UA not demonstrating acute infection. -Continue to minimize the use of nephrotoxic agents and avoid hypotension -Maintain adequate hydration -Renal functionstable  Hyperlipidemia -Continue statins.  History of BPH -Continue Flomax.  Morbid obesity -Body mass index is 42.01 kg/m. -Low-calorie diet, portion control increase physical activity discussed  with patient.   DVT prophylaxis:Lovenox Code Status:Full code. Family Communication:Discussed with friend who is the power of attorney on phone 1/30 Disposition:  Status is: Inpatient  Dispo: The patient is from: Skilled nursing facility Anticipated d/c is to: Skilled nursing facilityvs home hospice/facility Anticipated d/c date is: To be determined Patient currentlynotmedically stable for discharge; still requiring  oxygen supplementation. Complaining of feeling short winded and receiving IV therapy. Requiring 10L nasal cannula supplementation and nonrebreather currently. Now experiencing high-grade temperature and is having next rate with newright upper lobe consolidation.   Consultants:  Oncology service curbside: Dr. Delton Coombes.  Palliative care   Procedures: See below for x-ray reports  Antimicrobials:  Anti-infectives (From admission, onward)   Start     Dose/Rate Route Frequency Ordered Stop   04/26/20 1200  cefTRIAXone (ROCEPHIN) 2 g in sodium chloride 0.9 % 100 mL IVPB        2 g 200 mL/hr over 30 Minutes Intravenous Every 24 hours 04/26/20 0848     04/25/20 1630  piperacillin-tazobactam (ZOSYN) IVPB 3.375 g  Status:  Discontinued        3.375 g 12.5 mL/hr over 240 Minutes Intravenous Every 8 hours 04/25/20 1551 04/26/20 0848   04/21/20 1000  remdesivir 100 mg in sodium chloride 0.9 % 100 mL IVPB        100 mg 200 mL/hr over 30 Minutes Intravenous Daily 04/16/2020 1108 04/24/20 1835   04/21/20 1000  remdesivir 100 mg in sodium chloride 0.9 % 100 mL IVPB  Status:  Discontinued       "Followed by" Linked Group Details   100 mg 200 mL/hr over 30 Minutes Intravenous Daily 04/22/2020 1139 04/27/2020 1147   04/28/2020 1200  remdesivir 100 mg in sodium chloride 0.9 % 100 mL IVPB        100 mg 200 mL/hr over 30 Minutes Intravenous Every 1 hr x 2 04/19/2020 1108 04/06/2020 1315   04/26/2020 1145  remdesivir 200 mg in sodium  chloride 0.9% 250 mL IVPB  Status:  Discontinued       "Followed by" Linked Group Details   200 mg 580 mL/hr over 30 Minutes Intravenous Once 04/12/2020 1139 05/01/2020 1147   04/17/2020 1141  valACYclovir (VALTREX) tablet 1,000 mg        1,000 mg Oral Daily PRN 04/03/2020 1142        Subjective: Patient seen and evaluated today with no new acute complaints or concerns. No acute concerns or events noted overnight. He continues to have a cough.  Objective: Vitals:   04/30/20 0803 04/30/20 0855 04/30/20 0930 04/30/20 1153  BP:      Pulse: 66 79  73  Resp: (!) 25 (!) 24  (!) 33  Temp: 98.4 F (36.9 C)   97.7 F (36.5 C)  TempSrc: Axillary   Oral  SpO2: 95% (!) 85% (!) 85% (!) 82%  Weight:      Height:        Intake/Output Summary (Last 24 hours) at 04/30/2020 1400 Last data filed at 04/30/2020 1330 Gross per 24 hour  Intake 480 ml  Output 2050 ml  Net -1570 ml   Filed Weights   04/06/2020 0656 04/27/20 0400  Weight: (!) 140.5 kg 135.8 kg    Examination:  General exam: Appears calm and comfortable, obese Respiratory system: Clear to auscultation. Respiratory effort normal. 15L [place] oxygen. Cardiovascular system: S1 & S2 heard, RRR.  Gastrointestinal system: Abdomen is soft Central nervous system: Alert and awake Extremities: No edema Skin: No significant lesions noted Psychiatry: Flat affect.    Data Reviewed: I have personally reviewed following labs and imaging studies  CBC: Recent Labs  Lab 04/25/20 0352 04/27/20 0430 04/28/20 0621 04/29/20 0639 04/30/20 0500  WBC 67.4* 98.9* 110.8* 119.4* 108.1*  NEUTROABS 4.8 14.8* 12.2* 10.7* 8.6*  HGB 9.3* 8.3* 9.6* 8.4* 8.0*  HCT 31.0* 28.6* 32.9* 29.2* 28.6*  MCV 103.3* 104.8* 107.5* 107.0* 107.9*  PLT 139* 152 143* 134* [id number]*   Basic Metabolic Panel: Recent Labs  Lab 04/24/20 0454 04/25/20 0352 04/27/20 0430 04/28/20 0621 04/29/20 0639 04/30/20 0500  NA 140 142 141 143 141 142  K 4.2 4.2 3.2* 4.0 3.9 4.5  CL 106  106 106 106 107 108  CO2 24 27 26 24 24 26   GLUCOSE 143* 147* 54* 323* 173* 329*  BUN 20 16 19  29* 47* 52*  CREATININE 1.09 1.04 1.15 1.40* 1.66* 1.59*  CALCIUM 7.3* 7.1* 7.1* 7.7* 7.6* 7.7*  MG 2.6* 2.5* 2.9*  --  3.3* 3.5*  PHOS 2.8 3.4  --   --   --   --    GFR: Estimated Creatinine Clearance: 63.5 mL/min (A) (by C-G formula based on SCr of 1.59 mg/dL (H)). Liver Function Tests: Recent Labs  Lab 04/25/20 0352 04/27/20 0430 04/28/20 0621 04/29/20 0639 04/30/20 0500  AST 28 45* 44* 38 38  ALT 21 23 29 29 29   ALKPHOS 45 65 72 60 55  BILITOT 0.9 0.6 0.7 0.8 0.8  PROT 5.5* 5.9* 6.9 6.2* 5.7*  ALBUMIN 2.2* 2.1* 2.4* 2.1* 1.9*   No results for input(s): LIPASE, AMYLASE in the last 168 hours. Recent Labs  Lab 04/26/20 1450 04/27/20 0430 04/29/20 0639 04/30/20 0500  AMMONIA 47* 54* 39* 35   Coagulation Profile: No results for input(s): INR, PROTIME in the last 168 hours. Cardiac Enzymes: No results for input(s): CKTOTAL, CKMB, CKMBINDEX, TROPONINI in the last 168 hours. BNP (last 3 results) No results for input(s): PROBNP in the last 8760 hours. HbA1C: No results for input(s): HGBA1C in the last 72 hours. CBG: Recent Labs  Lab 04/29/20 1100 04/29/20 1559 04/29/20 2132 04/30/20 0801 04/30/20 1152  GLUCAP 180* 215* 256* 304* 295*   Lipid Profile: No results for input(s): CHOL, HDL, LDLCALC, TRIG, CHOLHDL, LDLDIRECT in the last 72 hours. Thyroid Function Tests: No results for input(s): TSH, T4TOTAL, FREET4, T3FREE, THYROIDAB in the last 72 hours. Anemia Panel: Recent Labs    04/29/20 0639 04/30/20 0500  FERRITIN 2,056* 1,762*   Sepsis Labs: Recent Labs  Lab 04/27/20 0430 04/28/20 0621 04/29/20 0639 04/30/20 0500  PROCALCITON 6.86 4.54 2.83 1.08    Recent Results (from the past 240 hour(s))  Urine Culture     Status: Abnormal   Collection Time: 04/25/20 11:40 AM   Specimen: Urine, Clean Catch  Result Value Ref Range Status   Specimen Description    Final    URINE, CLEAN CATCH Performed at [facility], [address], [place], [place] [address]    Special Requests   Final    NONE Performed at [facility], [address], [place], [place] [address]    Culture (A)  Final    >=100,000 COLONIES/mL ESCHERICHIA COLI 50,000 COLONIES/mL PROTEUS MIRABILIS    Report Status 04/28/2020 FINAL  Final   Organism ID, Bacteria ESCHERICHIA COLI (A)  Final   Organism ID, Bacteria PROTEUS MIRABILIS (A)  Final      Susceptibility   Escherichia coli - MIC*    AMPICILLIN 4 SENSITIVE Sensitive     CEFAZOLIN <=4 SENSITIVE Sensitive     CEFEPIME <=0.12 SENSITIVE Sensitive     CEFTRIAXONE <=0.25 SENSITIVE Sensitive     CIPROFLOXACIN <=0.25 SENSITIVE Sensitive     GENTAMICIN <=1 SENSITIVE Sensitive     IMIPENEM <=0.25 SENSITIVE Sensitive     NITROFURANTOIN <=16 SENSITIVE Sensitive  TRIMETH/SULFA <=20 SENSITIVE Sensitive     AMPICILLIN/SULBACTAM <=2 SENSITIVE Sensitive     PIP/TAZO <=4 SENSITIVE Sensitive     * >=100,000 COLONIES/mL ESCHERICHIA COLI   Proteus mirabilis - MIC*    AMPICILLIN <=2 SENSITIVE Sensitive     CEFAZOLIN <=4 SENSITIVE Sensitive     CEFEPIME <=0.12 SENSITIVE Sensitive     CEFTRIAXONE <=0.25 SENSITIVE Sensitive     CIPROFLOXACIN <=0.25 SENSITIVE Sensitive     GENTAMICIN <=1 SENSITIVE Sensitive     IMIPENEM 2 SENSITIVE Sensitive     NITROFURANTOIN 128 RESISTANT Resistant     TRIMETH/SULFA <=20 SENSITIVE Sensitive     AMPICILLIN/SULBACTAM <=2 SENSITIVE Sensitive     PIP/TAZO <=4 SENSITIVE Sensitive     * 50,000 COLONIES/mL PROTEUS MIRABILIS  Culture, blood (Routine X 2) w Reflex to ID Panel     Status: Abnormal   Collection Time: 04/25/20 12:46 PM   Specimen: BLOOD  Result Value Ref Range Status   Specimen Description   Final    BLOOD LEFT ANTECUBITAL Performed at [facility], [address] [place], [place] [address]    Special Requests   Final    Blood Culture adequate volume BOTTLES DRAWN AEROBIC AND  ANAEROBIC Performed at [facility], [address] [place], [place] [address]    Culture  Setup Time   Final    IN BOTH AEROBIC AND ANAEROBIC BOTTLES GRAM POSITIVE COCCI IN PAIRS Gram Stain Report Called to,Read Back By and Verified With: Duncan HEARN,RN@0016  04/26/20 MKELLY CRITICAL VALUE NOTED.  VALUE IS CONSISTENT WITH PREVIOUSLY REPORTED AND CALLED VALUE.    Culture (A)  Final    STREPTOCOCCUS PNEUMONIAE SUSCEPTIBILITIES PERFORMED ON PREVIOUS CULTURE WITHIN THE LAST 5 DAYS. Performed at [facility], [place] [address] [place], [place] [address]    Report Status 04/28/2020 FINAL  Final  Culture, blood (Routine X 2) w Reflex to ID Panel     Status: Abnormal   Collection Time: 04/25/20 12:47 PM   Specimen: BLOOD  Result Value Ref Range Status   Specimen Description   Final    BLOOD LEFT ANTECUBITAL Performed at [facility], [address], [place], [place] [address]    Special Requests   Final    BOTTLES DRAWN AEROBIC AND ANAEROBIC Blood Culture results may not be optimal due to an inadequate volume of blood received in culture bottles Performed at [facility], [address] [place], [place] [address]    Culture  Setup Time   Final    AEROBIC BOTTLE ONLY GRAM POSITIVE COCCI IN PAIRS Gram Stain Report Called to,Read Back By and Verified With: Duncan HEARN,RN 0016 04/26/20 MKELLY CRITICAL RESULT CALLED TO, READ BACK BY AND VERIFIED WITH: Duncan,HEARN RN @0504  04/26/20 EB Performed at [facility], [place] [address], [place], [place] [address]    Culture STREPTOCOCCUS PNEUMONIAE (A)  Final   Report Status 04/28/2020 FINAL  Final   Organism ID, Bacteria STREPTOCOCCUS PNEUMONIAE  Final      Susceptibility   Streptococcus pneumoniae - MIC*    ERYTHROMYCIN <=0.12 SENSITIVE Sensitive     LEVOFLOXACIN 0.5 SENSITIVE Sensitive     VANCOMYCIN 0.5 SENSITIVE Sensitive     PENO - penicillin <=0.06      PENICILLIN (non-meningitis) <=0.06 SENSITIVE Sensitive     PENICILLIN (oral) <=0.06  SENSITIVE Sensitive     CEFTRIAXONE (non-meningitis) <=0.12 SENSITIVE Sensitive     * STREPTOCOCCUS PNEUMONIAE  Blood Culture ID Panel (Reflexed)     Status: Abnormal  Collection Time: 04/25/20 12:47 PM  Result Value Ref Range Status   Enterococcus faecalis NOT DETECTED NOT DETECTED Final   Enterococcus Faecium NOT DETECTED NOT DETECTED Final   Listeria monocytogenes NOT DETECTED NOT DETECTED Final   Staphylococcus species NOT DETECTED NOT DETECTED Final   Staphylococcus aureus (BCID) NOT DETECTED NOT DETECTED Final   Staphylococcus epidermidis NOT DETECTED NOT DETECTED Final   Staphylococcus lugdunensis NOT DETECTED NOT DETECTED Final   Streptococcus species DETECTED (A) NOT DETECTED Final    Comment: CRITICAL RESULT CALLED TO, READ BACK BY AND VERIFIED WITH: Duncan,HEARN RN @0504  04/26/20 EB    Streptococcus agalactiae NOT DETECTED NOT DETECTED Final   Streptococcus pneumoniae DETECTED (A) NOT DETECTED Final    Comment: CRITICAL RESULT CALLED TO, READ BACK BY AND VERIFIED WITH: Duncan,HEARN RN @0504  04/26/20 EB    Streptococcus pyogenes NOT DETECTED NOT DETECTED Final   A.calcoaceticus-baumannii NOT DETECTED NOT DETECTED Final   Bacteroides fragilis NOT DETECTED NOT DETECTED Final   Enterobacterales NOT DETECTED NOT DETECTED Final   Enterobacter cloacae complex NOT DETECTED NOT DETECTED Final   Escherichia coli NOT DETECTED NOT DETECTED Final   Klebsiella aerogenes NOT DETECTED NOT DETECTED Final   Klebsiella oxytoca NOT DETECTED NOT DETECTED Final   Klebsiella pneumoniae NOT DETECTED NOT DETECTED Final   Proteus species NOT DETECTED NOT DETECTED Final   Salmonella species NOT DETECTED NOT DETECTED Final   Serratia marcescens NOT DETECTED NOT DETECTED Final   Haemophilus influenzae NOT DETECTED NOT DETECTED Final   Neisseria meningitidis NOT DETECTED NOT DETECTED Final   Pseudomonas aeruginosa NOT DETECTED NOT DETECTED Final   Stenotrophomonas maltophilia NOT DETECTED NOT DETECTED Final    Candida albicans NOT DETECTED NOT DETECTED Final   Candida auris NOT DETECTED NOT DETECTED Final   Candida glabrata NOT DETECTED NOT DETECTED Final   Candida krusei NOT DETECTED NOT DETECTED Final   Candida parapsilosis NOT DETECTED NOT DETECTED Final   Candida tropicalis NOT DETECTED NOT DETECTED Final   Cryptococcus neoformans/gattii NOT DETECTED NOT DETECTED Final    Comment: Performed at [facility], [address], [place], [place] [address]  MRSA PCR Screening     Status: None   Collection Time: 04/25/20 10:00 PM   Specimen: Nasal Mucosa; Nasopharyngeal  Result Value Ref Range Status   MRSA by PCR NEGATIVE NEGATIVE Final    Comment:        The GeneXpert MRSA Assay (FDA approved for NASAL specimens only), is one component of a comprehensive MRSA colonization surveillance program. It is not intended to diagnose MRSA infection nor to guide or monitor treatment for MRSA infections. Performed at [facility], [address], [place], [place] [address]   Gastrointestinal Panel by PCR , Stool     Status: None   Collection Time: 04/28/20  7:30 AM   Specimen: Rectum; Stool  Result Value Ref Range Status   Campylobacter species NOT DETECTED NOT DETECTED Final   Plesimonas shigelloides NOT DETECTED NOT DETECTED Final   Salmonella species NOT DETECTED NOT DETECTED Final   Yersinia enterocolitica NOT DETECTED NOT DETECTED Final   Vibrio species NOT DETECTED NOT DETECTED Final   Vibrio cholerae NOT DETECTED NOT DETECTED Final   Enteroaggregative E coli (EAEC) NOT DETECTED NOT DETECTED Final   Enteropathogenic E coli (EPEC) NOT DETECTED NOT DETECTED Final   Enterotoxigenic E coli (ETEC) NOT DETECTED NOT DETECTED Final   Shiga like toxin producing E coli (STEC) NOT DETECTED NOT DETECTED Final   Shigella/Enteroinvasive E  coli (EIEC) NOT DETECTED NOT DETECTED Final   Cryptosporidium NOT DETECTED NOT DETECTED Final   Cyclospora cayetanensis NOT DETECTED NOT DETECTED Final    Entamoeba histolytica NOT DETECTED NOT DETECTED Final   Giardia lamblia NOT DETECTED NOT DETECTED Final   Adenovirus F40/41 NOT DETECTED NOT DETECTED Final   Astrovirus NOT DETECTED NOT DETECTED Final   Norovirus GI/GII NOT DETECTED NOT DETECTED Final   Rotavirus A NOT DETECTED NOT DETECTED Final   Sapovirus (I, II, IV, and V) NOT DETECTED NOT DETECTED Final    Comment: Performed at [facility], [address], [place], [place] [address]  C Difficile Quick Screen (NO PCR Reflex)     Status: None   Collection Time: 04/28/20  7:30 AM   Specimen: Rectum; Stool  Result Value Ref Range Status   C Diff antigen NEGATIVE NEGATIVE Final   C Diff toxin NEGATIVE NEGATIVE Final   C Diff interpretation No C. difficile detected.  Final    Comment: Performed at [facility], [address], [place], [place] [address]         Radiology Studies: No results found.      Scheduled Meds: . allopurinol  300 mg Oral Daily  . amiodarone  200 mg Oral Daily  . vitamin C  500 mg Oral Daily  . aspirin EC  81 mg Oral Daily  . atorvastatin  80 mg Oral Daily  . benzonatate  200 mg Oral TID  . Chlorhexidine Gluconate Cloth  6 each Topical Daily  . enoxaparin (LOVENOX) injection  70 mg Subcutaneous Q24H  . famotidine  20 mg Oral BID  . insulin aspart  0-20 Units Subcutaneous TID WC  . insulin aspart  0-5 Units Subcutaneous QHS  . insulin detemir  15 Units Subcutaneous BID  . Ipratropium-Albuterol  1 puff Inhalation BID  . lactulose  20 g Oral TID  . methylPREDNISolone (SOLU-MEDROL) injection  125 mg Intravenous Q12H  . metoprolol tartrate  12.5 mg Oral BID  . multivitamin with minerals  1 tablet Oral Daily  . tamsulosin  0.4 mg Oral QPC supper  . trimethoprim-polymyxin b  1 drop Both Eyes Q6H  . zinc sulfate  220 mg Oral Daily   Continuous Infusions: . sodium chloride Stopped (04/25/20 1659)  . cefTRIAXone (ROCEPHIN)  IV 2 g (04/30/20 1321)  . lactated ringers 100 mL/hr at 04/30/20  0835     LOS: 10 days    Time spent: 35 minutes    Ardie Mclennan D Manuella Ghazi, DO [facility]  If 7PM-7AM, please contact night-coverage [email or web address] 04/30/2020, 2:00 PM

## 2020-05-01 DIAGNOSIS — J9621 Acute and chronic respiratory failure with hypoxia: Secondary | ICD-10-CM | POA: Diagnosis not present

## 2020-05-01 DIAGNOSIS — Z7189 Other specified counseling: Secondary | ICD-10-CM | POA: Diagnosis not present

## 2020-05-01 DIAGNOSIS — U071 COVID-19: Secondary | ICD-10-CM | POA: Diagnosis not present

## 2020-05-01 DIAGNOSIS — Z515 Encounter for palliative care: Secondary | ICD-10-CM | POA: Diagnosis not present

## 2020-05-01 LAB — MAGNESIUM: Magnesium: 3.1 mg/dL — ABNORMAL HIGH (ref 1.7–2.4)

## 2020-05-01 LAB — CBC WITH DIFFERENTIAL/PLATELET
Abs Immature Granulocytes: 0.48 10*3/uL — ABNORMAL HIGH (ref 0.00–0.07)
Basophils Absolute: 0 10*3/uL (ref 0.0–0.1)
Basophils Relative: 0 %
Blasts: 25 %
Eosinophils Absolute: 0 10*3/uL (ref 0.0–0.5)
Eosinophils Relative: 0 %
HCT: 29 % — ABNORMAL LOW (ref 39.0–52.0)
Hemoglobin: 8.2 g/dL — ABNORMAL LOW (ref 13.0–17.0)
Lymphocytes Relative: 67 %
Lymphs Abs: 90.6 10*3/uL — ABNORMAL HIGH (ref 0.7–4.0)
MCH: 30.5 pg (ref 26.0–34.0)
MCHC: 28.3 g/dL — ABNORMAL LOW (ref 30.0–36.0)
MCV: 107.8 fL — ABNORMAL HIGH (ref 80.0–100.0)
Monocytes Absolute: 1.4 10*3/uL — ABNORMAL HIGH (ref 0.1–1.0)
Monocytes Relative: 1 %
Neutro Abs: 9.5 10*3/uL — ABNORMAL HIGH (ref 1.7–7.7)
Neutrophils Relative %: 7 %
Platelets: 129 10*3/uL — ABNORMAL LOW (ref 150–400)
RBC: 2.69 MIL/uL — ABNORMAL LOW (ref 4.22–5.81)
RDW: 16.8 % — ABNORMAL HIGH (ref 11.5–15.5)
WBC: 135.2 10*3/uL (ref 4.0–10.5)
nRBC: 0 % (ref 0.0–0.2)

## 2020-05-01 LAB — COMPREHENSIVE METABOLIC PANEL
ALT: 31 U/L (ref 0–44)
AST: 41 U/L (ref 15–41)
Albumin: 2.1 g/dL — ABNORMAL LOW (ref 3.5–5.0)
Alkaline Phosphatase: 61 U/L (ref 38–126)
Anion gap: 10 (ref 5–15)
BUN: 43 mg/dL — ABNORMAL HIGH (ref 8–23)
CO2: 24 mmol/L (ref 22–32)
Calcium: 7.7 mg/dL — ABNORMAL LOW (ref 8.9–10.3)
Chloride: 106 mmol/L (ref 98–111)
Creatinine, Ser: 1.31 mg/dL — ABNORMAL HIGH (ref 0.61–1.24)
GFR, Estimated: 59 mL/min — ABNORMAL LOW (ref >60)
Glucose, Bld: 292 mg/dL — ABNORMAL HIGH (ref 70–99)
Potassium: 3.9 mmol/L (ref 3.5–5.1)
Sodium: 140 mmol/L (ref 135–145)
Total Bilirubin: 0.7 mg/dL (ref 0.3–1.2)
Total Protein: 5.8 g/dL — ABNORMAL LOW (ref 6.5–8.1)

## 2020-05-01 LAB — AMMONIA: Ammonia: 36 umol/L — ABNORMAL HIGH (ref 9–35)

## 2020-05-01 LAB — GLUCOSE, CAPILLARY
Glucose-Capillary: 288 mg/dL — ABNORMAL HIGH (ref 70–99)
Glucose-Capillary: 281 mg/dL — ABNORMAL HIGH (ref 70–99)
Glucose-Capillary: 238 mg/dL — ABNORMAL HIGH (ref 70–99)
Glucose-Capillary: 220 mg/dL — ABNORMAL HIGH (ref 70–99)

## 2020-05-01 LAB — FERRITIN: Ferritin: 1931 ng/mL — ABNORMAL HIGH (ref 24–336)

## 2020-05-01 LAB — C-REACTIVE PROTEIN: CRP: 9.2 mg/dL — ABNORMAL HIGH (ref <1.0)

## 2020-05-01 MED ORDER — INSULIN DETEMIR 100 UNIT/ML ~~LOC~~ SOLN
18.0000 [IU] | Freq: Two times a day (BID) | SUBCUTANEOUS | Status: DC
  Administered 2020-05-01 – 2020-05-02 (×2): 18 [IU] via SUBCUTANEOUS
  Filled 2020-05-01 (×4): qty 0.18

## 2020-05-01 MED ORDER — ENSURE ENLIVE PO LIQD
237.0000 mL | Freq: Three times a day (TID) | ORAL | Status: DC
  Administered 2020-05-01: 237 mL via ORAL

## 2020-05-01 MED ORDER — INSULIN ASPART 100 UNIT/ML ~~LOC~~ SOLN
4.0000 [IU] | Freq: Three times a day (TID) | SUBCUTANEOUS | Status: DC
  Administered 2020-05-01 – 2020-05-02 (×4): 4 [IU] via SUBCUTANEOUS

## 2020-05-01 MED ORDER — LORAZEPAM 2 MG/ML IJ SOLN
1.0000 mg | INTRAMUSCULAR | Status: DC | PRN
  Administered 2020-05-01 – 2020-05-02 (×3): 1 mg via INTRAVENOUS
  Filled 2020-05-01 (×3): qty 1

## 2020-05-01 NOTE — Progress Notes (Signed)
Pt continuing to pull off both oxygen masks. Mitts re-applied. Talked to MD about something for anxiety for the patient as he continuously yells "help me, help me!" Pt states he has took something in the past that has helped. MD ordered medication. Will continue to monitor.

## 2020-05-01 NOTE — Progress Notes (Signed)
Palliative: Preparing to enter room, donning PPE, Tim Duncan is waving at me stating, "help me".  I enter the room to find that he has removed his high flow nasal cannula and his nonrebreather.  I replaced the nasal cannula, and his saturations rise to the mid 80s.  He is alert, oriented to self and place, but cannot tell me the month.  There is no family at bedside at this time due to Covid visitor restrictions.  Tim Duncan asks for help several times.  He states that he would like to return to [place].  We talked about his need for oxygen.  I asked what would happen if he went to [facility] and got worse, would it be okay for him to go and not come back.  He clearly states, "no".  I asked if he would like to focus on comfort or if he would like to keep trying to get better.  He clearly tells me that he would like to keep trying to get better.   Call to friend/HC POA, Tim Duncan.  We talked about my visit with Tim Duncan earlier today.  I share that although he told other staff he was tired, he tells me he would like to continue trying to get better.  Tim Duncan states that Tim Duncan has had a difficult time.  She states that she would like to let him know that she is thinking about him.  She request nursing staff set up a video chat.    Conference with attending, bedside nursing staff, transition of care team related to patient condition, needs, goals of care.  Plan: Continue to treat the treatable but no CPR or intubation.  Time for outcomes.  77 minutes Tim Axe, NP Palliative medicine team Team phone [phone] Greater than 50% of this time was spent counseling and coordinating care related to the above assessment and plan.

## 2020-05-01 NOTE — Evaluation (Signed)
Physical Therapy Evaluation Patient Details Name: Tim Duncan MRN: [id number] DOB: 1950-12-01 Today's Date: 05/01/2020   History of Present Illness  Tim Duncan is a 70 y.o. male with medical history significant of hypertension, BPH, atrial fibrillation (not chronically on anticoagulation), chronic lymphocytic leukemia (actively follow by oncology service at [facility]), hyperlipidemia, morbid obesity and diabetes; who presented to the emergency department secondary to general malaise, shortness of breath and intermittent coughing spells.  Patient symptoms have been present for the last 2 days or so and was tested and found to be positive for COVID.  He is a skilled nursing facility oxygen saturation on room air was around 75%.  Patient reports chronic diarrhea, no nausea or vomiting.  Expressed having chills.  Nonproductive coughing spells and shortness of breath at rest on exertion.  No hemoptysis.  Of note, patient expressed to be vaccinated with Wynetta Emery & Wynetta Emery vaccine in the past.    Clinical Impression  Patient demonstrates slow labored movement for sitting up at bedside requiring frequent rest breaks due to fatigue, multiple attempts of sit to stands, but patient unable due to generalized weakness.  Patient put back to bed with Max/total assist to reposition.  Patient will benefit from continued physical therapy in hospital and recommended venue below to increase strength, balance, endurance for safe ADLs and gait.     Follow Up Recommendations SNF    Equipment Recommendations  None recommended by PT    Recommendations for Other Services       Precautions / Restrictions Precautions Precautions: Fall Restrictions Weight Bearing Restrictions: No      Mobility  Bed Mobility Overal bed mobility: Needs Assistance Bed Mobility: Supine to Sit;Sit to Supine     Supine to sit: Max assist Sit to supine: Max assist   General bed mobility comments: slow labored movement     Transfers                    Ambulation/Gait                Stairs            Wheelchair Mobility    Modified Rankin (Stroke Patients Only)       Balance Overall balance assessment: Needs assistance Sitting-balance support: Feet supported;No upper extremity supported Sitting balance-Leahy Scale: Fair Sitting balance - Comments: seated at EOB                                     Pertinent Vitals/Pain Pain Assessment: Faces Faces Pain Scale: Hurts even more Pain Location: pressure to BLE Pain Descriptors / Indicators: Discomfort;Grimacing;Moaning Pain Intervention(s): Limited activity within patient's tolerance;Monitored during session;Repositioned    Home Living Family/patient expects to be discharged to:: Skilled nursing facility                      Prior Function Level of Independence: Needs assistance   Gait / Transfers Assistance Needed: Assisted short household distances using RW  ADL's / Homemaking Assistance Needed: assisted by SNF staff        Hand Dominance   Dominant Hand: Right    Extremity/Trunk Assessment   Upper Extremity Assessment Upper Extremity Assessment: Generalized weakness    Lower Extremity Assessment Lower Extremity Assessment: Generalized weakness    Cervical / Trunk Assessment Cervical / Trunk Assessment: Normal  Communication   Communication: No difficulties  Cognition  Arousal/Alertness: Awake/alert Behavior During Therapy: WFL for tasks assessed/performed Overall Cognitive Status: Within Functional Limits for tasks assessed                                        General Comments      Exercises     Assessment/Plan    PT Assessment Patient needs continued PT services  PT Problem List Decreased strength;Decreased activity tolerance;Decreased balance;Decreased mobility       PT Treatment Interventions DME instruction;Stair training;Functional mobility  training;Therapeutic activities;Therapeutic exercise;Gait training;Balance training;Patient/family education    PT Goals (Current goals can be found in the Care Plan section)  Acute Rehab PT Goals Patient Stated Goal: take steps and transfer to chair PT Goal Formulation: With patient Time For Goal Achievement: 05/15/20 Potential to Achieve Goals: Good    Frequency Min 3X/week   Barriers to discharge        Co-evaluation               AM-PAC PT "6 Clicks" Mobility  Outcome Measure Help needed turning from your back to your side while in a flat bed without using bedrails?: A Lot Help needed moving from lying on your back to sitting on the side of a flat bed without using bedrails?: A Lot Help needed moving to and from a bed to a chair (including a wheelchair)?: Total Help needed standing up from a chair using your arms (e.g., wheelchair or bedside chair)?: Total Help needed to walk in hospital room?: Total Help needed climbing 3-5 steps with a railing? : Total 6 Click Score: 8    End of Session Equipment Utilized During Treatment: Oxygen Activity Tolerance: Patient tolerated treatment well;Patient limited by fatigue Patient left: in bed;with call bell/phone within reach Nurse Communication: Mobility status PT Visit Diagnosis: Unsteadiness on feet (R26.81);Other abnormalities of gait and mobility (R26.89);Muscle weakness (generalized) (M62.81)    Time: 8242-3536 PT Time Calculation (min) (ACUTE ONLY): 28 min   Charges:   PT Evaluation $PT Eval Moderate Complexity: 1 Mod PT Treatments $Therapeutic Activity: 23-37 mins        3:26 PM, 05/01/20 Lonell Grandchild, MPT Physical Therapist with [facility] [phone] office [phone] mobile phone

## 2020-05-01 NOTE — Progress Notes (Signed)
Inpatient Diabetes Program Recommendations  AACE/ADA: New Consensus Statement on Inpatient Glycemic Control   Target Ranges:  Prepandial:   less than 140 mg/dL      Peak postprandial:   less than 180 mg/dL (1-2 hours)      Critically ill patients:  140 - 180 mg/dL   Results for Tim Duncan, DINOVO (MRN [id number]) as of 05/01/2020 12:54  Ref. Range 04/30/2020 08:01 04/30/2020 11:52 04/30/2020 15:59 04/30/2020 21:05 05/01/2020 07:56 05/01/2020 11:39  Glucose-Capillary Latest Ref Range: 70 - 99 mg/dL 304 (H) 295 (H) 246 (H) 241 (H) 288 (H) 281 (H)   Review of Glycemic Control  Diabetes history: DM2 Outpatient Diabetes medications: Lantus 30 units daily, Novolog 0-9 units TID Current orders for Inpatient glycemic control: Levemir 15 units BID, Novolog 0-20 units TID with meals, Novolog 0-5 units QHS; Solumedrol 125 mg Q12H  Inpatient Diabetes Program Recommendations:    Insulin: If steroids are continued as ordered, please consider increasing Levemir to 18 units BID and ordering Novolog 4 units TID with meals for meal coverage if patient eats at least 50% of meals.  Thanks, Barnie Alderman, RN, MSN, CDE Diabetes Coordinator Inpatient Diabetes Program [phone] (Team Pager from 8am to 5pm)

## 2020-05-01 NOTE — Progress Notes (Addendum)
Initial Nutrition Assessment  DOCUMENTATION CODES:      INTERVENTION:  Increase Ensure TID   Liberalize diet to Regular due to poor intake  Magic cup TID with meals, each supplement provides 290 kcal and 9 grams of protein   NUTRITION DIAGNOSIS:   Increased nutrient needs related to catabolic illness ([id number], acute on chronic respiratory failure) as evidenced by estimated needs,energy intake < or equal to 50% for > or equal to 5 days (based on doucmented meal intake).  GOAL:  Provide needs based on ASPEN/SCCM guidelines (if feasible given his current state of health)   MONITOR:  Supplement acceptance,I & O's,PO intake   REASON FOR ASSESSMENT:   Consult Assessment of nutrition requirement/status  ASSESSMENT: Patient is an obese 70 yo with acute on chronic respiratory failure, COVID-pneumonia, UTI, strep PNA-bacteremia, DM-2. Receiving 15 liters oxygen NRB mask.  Most recent meal intake documented on 1/21-at 50%. LOS day 11. Poor appetite/intake per discussion with nursing. Patient progression reviewed in rounds. Palliative is following. Expect patient is malnourished given his limited intake and increased needs.   Labs reviewed: WBC-135.2 (H), Hgb 8.2 (L), Glucose 292 (H), Ferritin 1931 (H).   Medications: Vitamin C, Lipitor, Novolog, Levemir, Pepcid, Chronulac, Solumedrol ,MVI, Zinc  Intake/Output Summary (Last 24 hours) at 05/01/2020 1058 Last data filed at 05/01/2020 0600 Gross per 24 hour  Intake 120 ml  Output 2450 ml  Net -2330 ml  Since admission: -10.392 liters Review of weight history range of 134-142 kg the past 6 months.    NUTRITION - FOCUSED PHYSICAL EXAM: Unable to complete Nutrition-Focused physical exam at this time.    Diet Order:   Diet Order            Diet Heart Room service appropriate? Yes; Fluid consistency: Thin  Diet effective now                 EDUCATION NEEDS:  Not appropriate for education at this time  Skin:  Skin Assessment:  Reviewed RN Assessment  Last BM:  1/28  Height:   Ht Readings from Last 1 Encounters:  04/28/20 6' (1.829 m)    Weight:   Wt Readings from Last 1 Encounters:  05/01/20 (!) 137.3 kg    Ideal Body Weight:   81 kg   Adjusted BW-84.92 kg  BMI:  Body mass index is 41.05 kg/m.  Estimated Nutritional Needs:   Kcal:  2200-2300  Protein:  127-136 gr  Fluid:  >2 liters daily   Colman Cater MS,RD,CSG,LDN Pager: Shea Evans

## 2020-05-01 NOTE — Progress Notes (Addendum)
PROGRESS NOTE    Tim Duncan  [id number] DOB: [birth year] DOA: 04/24/2020 PCP: Tim Squibb, MD   Brief Narrative:  Tim Duncan a 70 y.o.malewith medical history significant ofhypertension, BPH, atrial fibrillation (not chronically on anticoagulation), chronic lymphocytic leukemia (actively follow by oncology service at [facility]), hyperlipidemia, morbid obesity and diabetes; who presented to the emergency department secondary to general malaise, shortness of breath and intermittent coughing spells. Patient symptoms have been present for the last 2 days or so and was tested and found to be positive for COVID. He is a skilled nursing facility oxygen saturation on room air was around 75%. Patient reports chronic diarrhea, no nausea or vomiting. Expressed having chills. Nonproductive coughing spells and shortness of breath at rest on exertion. No hemoptysis. Of note, patient expressed to be vaccinated with Wynetta Emery & Wynetta Emery vaccine in the past.   ED Course:Inflammatory markers corroborating acute COVID infection; bilateral infiltrates appreciated on all his lungs characteristic for COVID. IV steroids and remdesivir's initiated. [facility] has been contacted to place patient in the hospital for acute respiratory failure with hypoxia in the setting of COVID-pneumonia. Patient was requiring 4 L nasal cannula supplementation.  Assessment & Plan:   Active Problems:   Acute on chronic respiratory failure with hypoxia (HCC)   Acute on chronic hypoxemic respiratory failure -Currently on 15 L nasal cannula oxygen with NRB mask -Continue to follow inflammatory markers -Completedremdesivir infusion 04/24/20. -Baricitinib discontinued 1/27 -Resumethe use of IV steroids, restarted 1/27 -Continue to follow vitamin C and zinc -Will continue as needed bronchodilator, proning position, incentive spirometer and flutter valve -Follow clinical response and continue supportive  care. -Continue as needed antitussive medications. -Patient willremain in the stepdown unit; incase he endedrequiringheated high flow oxygen supplementation.  Acute encephalopathy-multifactorial, improving -Appears to be related to hepatic encephalopathy with some mild elevation in ammonia levels for which lactulose started, follow trend -TSH within normal limits -Likely related to critical illness  Strep pneumonia bacteremia with ongoing fevers -Currently on full dose Rocephin for treatment day 6/7 -Procalcitonin downtrending  E. coli and Proteus UTI -Sensitive to Rocephin which will be continued for now  Ongoing diarrhea-improving -C. difficile negative and GI panel negative  History of paroxysmal atrial fibrillation -Sinus rhythm and rate controlled -Continue amiodarone and beta-blocker.  CLL -WBCs in the100s range currently -Continue to follow trendand continue to monitor procalcitonin for better guidance which has been downtrending -Case discussed with oncology service (Dr. Delton Coombes) and recommendations given toholdvenetoclax while acutely ill; may resume on dc  Type 2 diabetes -Continue sliding scale insulin and Levemir -Follow CBGs and adjust hypoglycemic regimen as needed.  Gastroesophageal reflux disease/GI prophylaxis -Change PPI to H2 blocker  Essential hypertension -Currently stable and well-controlled -Followed by -Continue adjusted dose of antihypertensive regimen.  Acute kidney injury-improving -Urine outputadequate -In the setting of dehydration and prerenal azotemia -UA not demonstrating acute infection. -Continue to minimize the use of nephrotoxic agents and avoid hypotension -Maintain adequate hydration -Renal functionstable  Hyperlipidemia -Continue statins.  History of BPH -Continue Flomax.  Morbid obesity -Body mass index is 42.01 kg/m. -Low-calorie diet, portion control increase physical activity discussed with  patient.   DVT prophylaxis:Lovenox Code Status:Full code. Family Communication:Discussed with friend who is the power of attorney on phone 1/30 Disposition:  Status is: Inpatient  Dispo: The patient is from: Skilled nursing facility Anticipated d/c is to: Skilled nursing facilityvs home hospice/facility Anticipated d/c date is: To be determined Patient currentlynotmedically stable for discharge; still requiring oxygen supplementation. Complaining  of feeling short winded and receiving IV therapy. Requiring 10L nasal cannula supplementation and nonrebreather currently. Now experiencing high-grade temperature and is having next rate with newright upper lobe consolidation.   Consultants:  Oncology service curbside: Dr. Delton Coombes.  Palliative care   Procedures: See below for x-ray reports  Antimicrobials:  Anti-infectives (From admission, onward)   Start     Dose/Rate Route Frequency Ordered Stop   04/26/20 1200  cefTRIAXone (ROCEPHIN) 2 g in sodium chloride 0.9 % 100 mL IVPB        2 g 200 mL/hr over 30 Minutes Intravenous Every 24 hours 04/26/20 0848     04/25/20 1630  piperacillin-tazobactam (ZOSYN) IVPB 3.375 g  Status:  Discontinued        3.375 g 12.5 mL/hr over 240 Minutes Intravenous Every 8 hours 04/25/20 1551 04/26/20 0848   04/21/20 1000  remdesivir 100 mg in sodium chloride 0.9 % 100 mL IVPB        100 mg 200 mL/hr over 30 Minutes Intravenous Daily 04/25/2020 1108 04/24/20 1835   04/21/20 1000  remdesivir 100 mg in sodium chloride 0.9 % 100 mL IVPB  Status:  Discontinued       "Followed by" Linked Group Details   100 mg 200 mL/hr over 30 Minutes Intravenous Daily 04/17/2020 1139 04/12/2020 1147   04/08/2020 1200  remdesivir 100 mg in sodium chloride 0.9 % 100 mL IVPB        100 mg 200 mL/hr over 30 Minutes Intravenous Every 1 hr x 2 04/17/2020 1108 04/10/2020 1315   04/18/2020 1145  remdesivir 200 mg in sodium chloride  0.9% 250 mL IVPB  Status:  Discontinued       "Followed by" Linked Group Details   200 mg 580 mL/hr over 30 Minutes Intravenous Once 04/11/2020 1139 04/06/2020 1147   04/05/2020 1141  valACYclovir (VALTREX) tablet 1,000 mg        1,000 mg Oral Daily PRN 05/01/2020 1142            Subjective: Patient seen and evaluated today with no new acute complaints or concerns. No acute concerns or events noted overnight. He states he is getting tired of treatment and would like to leave the hospital.  Objective: Vitals:   05/01/20 0800 05/01/20 1000 05/01/20 1137 05/01/20 1200  BP: 135/66 (!) 148/54  (!) 160/71  Pulse: 84 82  85  Resp: (!) 30 (!) 28  (!) 23  Temp:   98.1 F (36.7 C)   TempSrc:   Axillary   SpO2: 92% 93%  90%  Weight:      Height:        Intake/Output Summary (Last 24 hours) at 05/01/2020 1317 Last data filed at 05/01/2020 0935 Gross per 24 hour  Intake 320 ml  Output 2450 ml  Net -2130 ml   Filed Weights   04/14/2020 0656 04/27/20 0400 05/01/20 0500  Weight: (!) 140.5 kg 135.8 kg (!) 137.3 kg    Examination:  General exam: Appears calm and comfortable, obese Respiratory system: Clear to auscultation. Respiratory effort normal.  Currently on 15 L nasal cannula as well as nonrebreather mask Cardiovascular system: S1 & S2 heard, RRR.  Gastrointestinal system: Abdomen is soft Central nervous system: Alert and awake Extremities: No edema Skin: No significant lesions noted Psychiatry: Flat affect.    Data Reviewed: I have personally reviewed following labs and imaging studies  CBC: Recent Labs  Lab 04/27/20 0430 04/28/20 0621 04/29/20 0639 04/30/20 0500 05/01/20 0552  WBC  98.9* 110.8* 119.4* 108.1* 135.2*  NEUTROABS 14.8* 12.2* 10.7* 8.6* 9.5*  HGB 8.3* 9.6* 8.4* 8.0* 8.2*  HCT 28.6* 32.9* 29.2* 28.6* 29.0*  MCV 104.8* 107.5* 107.0* 107.9* 107.8*  PLT 152 143* 134* 120* 619*   Basic Metabolic Panel: Recent Labs  Lab 04/25/20 0352 04/27/20 0430  04/28/20 0621 04/29/20 0639 04/30/20 0500 05/01/20 0552  NA 142 141 143 141 142 140  K 4.2 3.2* 4.0 3.9 4.5 3.9  CL 106 106 106 107 108 106  CO2 27 26 24 24 26 24   GLUCOSE 147* 54* 323* 173* 329* 292*  BUN 16 19 29* 47* 52* 43*  CREATININE 1.04 1.15 1.40* 1.66* 1.59* 1.31*  CALCIUM 7.1* 7.1* 7.7* 7.6* 7.7* 7.7*  MG 2.5* 2.9*  --  3.3* 3.5* 3.1*  PHOS 3.4  --   --   --   --   --    GFR: Estimated Creatinine Clearance: 77.5 mL/min (A) (by C-G formula based on SCr of 1.31 mg/dL (H)). Liver Function Tests: Recent Labs  Lab 04/27/20 0430 04/28/20 0621 04/29/20 0639 04/30/20 0500 05/01/20 0552  AST 45* 44* 38 38 41  ALT 23 29 29 29 31   ALKPHOS 65 72 60 55 61  BILITOT 0.6 0.7 0.8 0.8 0.7  PROT 5.9* 6.9 6.2* 5.7* 5.8*  ALBUMIN 2.1* 2.4* 2.1* 1.9* 2.1*   No results for input(s): LIPASE, AMYLASE in the last 168 hours. Recent Labs  Lab 04/26/20 1450 04/27/20 0430 04/29/20 0639 04/30/20 0500 05/01/20 0552  AMMONIA 47* 54* 39* 35 36*   Coagulation Profile: No results for input(s): INR, PROTIME in the last 168 hours. Cardiac Enzymes: No results for input(s): CKTOTAL, CKMB, CKMBINDEX, TROPONINI in the last 168 hours. BNP (last 3 results) No results for input(s): PROBNP in the last 8760 hours. HbA1C: No results for input(s): HGBA1C in the last 72 hours. CBG: Recent Labs  Lab 04/30/20 1152 04/30/20 1559 04/30/20 2105 05/01/20 0756 05/01/20 1139  GLUCAP 295* 246* 241* 288* 281*   Lipid Profile: No results for input(s): CHOL, HDL, LDLCALC, TRIG, CHOLHDL, LDLDIRECT in the last 72 hours. Thyroid Function Tests: No results for input(s): TSH, T4TOTAL, FREET4, T3FREE, THYROIDAB in the last 72 hours. Anemia Panel: Recent Labs    04/30/20 0500 05/01/20 0552  FERRITIN 1,762* 1,931*   Sepsis Labs: Recent Labs  Lab 04/27/20 0430 04/28/20 0621 04/29/20 0639 04/30/20 0500  PROCALCITON 6.86 4.54 2.83 1.08    Recent Results (from the past 240 hour(s))  Urine Culture      Status: Abnormal   Collection Time: 04/25/20 11:40 AM   Specimen: Urine, Clean Catch  Result Value Ref Range Status   Specimen Description   Final    URINE, CLEAN CATCH Performed at [facility], [address], [place], [place] [address]    Special Requests   Final    NONE Performed at [facility], [address], [place], [place] [address]    Culture (A)  Final    >=100,000 COLONIES/mL ESCHERICHIA COLI 50,000 COLONIES/mL PROTEUS MIRABILIS    Report Status 04/28/2020 FINAL  Final   Organism ID, Bacteria ESCHERICHIA COLI (A)  Final   Organism ID, Bacteria PROTEUS MIRABILIS (A)  Final      Susceptibility   Escherichia coli - MIC*    AMPICILLIN 4 SENSITIVE Sensitive     CEFAZOLIN <=4 SENSITIVE Sensitive     CEFEPIME <=0.12 SENSITIVE Sensitive     CEFTRIAXONE <=0.25 SENSITIVE Sensitive     CIPROFLOXACIN <=0.25 SENSITIVE Sensitive  GENTAMICIN <=1 SENSITIVE Sensitive     IMIPENEM <=0.25 SENSITIVE Sensitive     NITROFURANTOIN <=16 SENSITIVE Sensitive     TRIMETH/SULFA <=20 SENSITIVE Sensitive     AMPICILLIN/SULBACTAM <=2 SENSITIVE Sensitive     PIP/TAZO <=4 SENSITIVE Sensitive     * >=100,000 COLONIES/mL ESCHERICHIA COLI   Proteus mirabilis - MIC*    AMPICILLIN <=2 SENSITIVE Sensitive     CEFAZOLIN <=4 SENSITIVE Sensitive     CEFEPIME <=0.12 SENSITIVE Sensitive     CEFTRIAXONE <=0.25 SENSITIVE Sensitive     CIPROFLOXACIN <=0.25 SENSITIVE Sensitive     GENTAMICIN <=1 SENSITIVE Sensitive     IMIPENEM 2 SENSITIVE Sensitive     NITROFURANTOIN 128 RESISTANT Resistant     TRIMETH/SULFA <=20 SENSITIVE Sensitive     AMPICILLIN/SULBACTAM <=2 SENSITIVE Sensitive     PIP/TAZO <=4 SENSITIVE Sensitive     * 50,000 COLONIES/mL PROTEUS MIRABILIS  Culture, blood (Routine X 2) w Reflex to ID Panel     Status: Abnormal   Collection Time: 04/25/20 12:46 PM   Specimen: BLOOD  Result Value Ref Range Status   Specimen Description   Final    BLOOD LEFT ANTECUBITAL Performed at [facility], [address] [place], [place] [address]    Special Requests   Final    Blood Culture adequate volume BOTTLES DRAWN AEROBIC AND ANAEROBIC Performed at [facility], [address], [place], [place] [address]    Culture  Setup Time   Final    IN BOTH AEROBIC AND ANAEROBIC BOTTLES GRAM POSITIVE COCCI IN PAIRS Gram Stain Report Called to,Read Back By and Verified With: J HEARN,RN@0016  04/26/20 MKELLY CRITICAL VALUE NOTED.  VALUE IS CONSISTENT WITH PREVIOUSLY REPORTED AND CALLED VALUE.    Culture (A)  Final    STREPTOCOCCUS PNEUMONIAE SUSCEPTIBILITIES PERFORMED ON PREVIOUS CULTURE WITHIN THE LAST 5 DAYS. Performed at [facility], [place] [address], [place], [place] [address]    Report Status 04/28/2020 FINAL  Final  Culture, blood (Routine X 2) w Reflex to ID Panel     Status: Abnormal   Collection Time: 04/25/20 12:47 PM   Specimen: BLOOD  Result Value Ref Range Status   Specimen Description   Final    BLOOD LEFT ANTECUBITAL Performed at [facility], [address], [place], [place] [address]    Special Requests   Final    BOTTLES DRAWN AEROBIC AND ANAEROBIC Blood Culture results may not be optimal due to an inadequate volume of blood received in culture bottles Performed at [facility], [address] [place], [place] [address]    Culture  Setup Time   Final    AEROBIC BOTTLE ONLY GRAM POSITIVE COCCI IN PAIRS Gram Stain Report Called to,Read Back By and Verified With: J HEARN,RN 0016 04/26/20 MKELLY CRITICAL RESULT CALLED TO, READ BACK BY AND VERIFIED WITH: J,HEARN RN @0504  04/26/20 EB Performed at [facility], [place] [address] [place], [place] [address]    Culture STREPTOCOCCUS PNEUMONIAE (A)  Final   Report Status 04/28/2020 FINAL  Final   Organism ID, Bacteria STREPTOCOCCUS PNEUMONIAE  Final      Susceptibility   Streptococcus pneumoniae - MIC*    ERYTHROMYCIN <=0.12 SENSITIVE Sensitive     LEVOFLOXACIN 0.5 SENSITIVE Sensitive     VANCOMYCIN 0.5 SENSITIVE  Sensitive     PENO - penicillin <=0.06      PENICILLIN (non-meningitis) <=0.06 SENSITIVE Sensitive     PENICILLIN (oral) <=0.06 SENSITIVE Sensitive  CEFTRIAXONE (non-meningitis) <=0.12 SENSITIVE Sensitive     * STREPTOCOCCUS PNEUMONIAE  Blood Culture ID Panel (Reflexed)     Status: Abnormal   Collection Time: 04/25/20 12:47 PM  Result Value Ref Range Status   Enterococcus faecalis NOT DETECTED NOT DETECTED Final   Enterococcus Faecium NOT DETECTED NOT DETECTED Final   Listeria monocytogenes NOT DETECTED NOT DETECTED Final   Staphylococcus species NOT DETECTED NOT DETECTED Final   Staphylococcus aureus (BCID) NOT DETECTED NOT DETECTED Final   Staphylococcus epidermidis NOT DETECTED NOT DETECTED Final   Staphylococcus lugdunensis NOT DETECTED NOT DETECTED Final   Streptococcus species DETECTED (A) NOT DETECTED Final    Comment: CRITICAL RESULT CALLED TO, READ BACK BY AND VERIFIED WITH: J,HEARN RN @0504  04/26/20 EB    Streptococcus agalactiae NOT DETECTED NOT DETECTED Final   Streptococcus pneumoniae DETECTED (A) NOT DETECTED Final    Comment: CRITICAL RESULT CALLED TO, READ BACK BY AND VERIFIED WITH: J,HEARN RN @0504  04/26/20 EB    Streptococcus pyogenes NOT DETECTED NOT DETECTED Final   A.calcoaceticus-baumannii NOT DETECTED NOT DETECTED Final   Bacteroides fragilis NOT DETECTED NOT DETECTED Final   Enterobacterales NOT DETECTED NOT DETECTED Final   Enterobacter cloacae complex NOT DETECTED NOT DETECTED Final   Escherichia coli NOT DETECTED NOT DETECTED Final   Klebsiella aerogenes NOT DETECTED NOT DETECTED Final   Klebsiella oxytoca NOT DETECTED NOT DETECTED Final   Klebsiella pneumoniae NOT DETECTED NOT DETECTED Final   Proteus species NOT DETECTED NOT DETECTED Final   Salmonella species NOT DETECTED NOT DETECTED Final   Serratia marcescens NOT DETECTED NOT DETECTED Final   Haemophilus influenzae NOT DETECTED NOT DETECTED Final   Neisseria meningitidis NOT DETECTED NOT  DETECTED Final   Pseudomonas aeruginosa NOT DETECTED NOT DETECTED Final   Stenotrophomonas maltophilia NOT DETECTED NOT DETECTED Final   Candida albicans NOT DETECTED NOT DETECTED Final   Candida auris NOT DETECTED NOT DETECTED Final   Candida glabrata NOT DETECTED NOT DETECTED Final   Candida krusei NOT DETECTED NOT DETECTED Final   Candida parapsilosis NOT DETECTED NOT DETECTED Final   Candida tropicalis NOT DETECTED NOT DETECTED Final   Cryptococcus neoformans/gattii NOT DETECTED NOT DETECTED Final    Comment: Performed at [facility], [address], [place], [place] [address]  MRSA PCR Screening     Status: None   Collection Time: 04/25/20 10:00 PM   Specimen: Nasal Mucosa; Nasopharyngeal  Result Value Ref Range Status   MRSA by PCR NEGATIVE NEGATIVE Final    Comment:        The GeneXpert MRSA Assay (FDA approved for NASAL specimens only), is one component of a comprehensive MRSA colonization surveillance program. It is not intended to diagnose MRSA infection nor to guide or monitor treatment for MRSA infections. Performed at [facility], [address], [place], [place] [address]   Gastrointestinal Panel by PCR , Stool     Status: None   Collection Time: 04/28/20  7:30 AM   Specimen: Rectum; Stool  Result Value Ref Range Status   Campylobacter species NOT DETECTED NOT DETECTED Final   Plesimonas shigelloides NOT DETECTED NOT DETECTED Final   Salmonella species NOT DETECTED NOT DETECTED Final   Yersinia enterocolitica NOT DETECTED NOT DETECTED Final   Vibrio species NOT DETECTED NOT DETECTED Final   Vibrio cholerae NOT DETECTED NOT DETECTED Final   Enteroaggregative E coli (EAEC) NOT DETECTED NOT DETECTED Final   Enteropathogenic E coli (EPEC) NOT DETECTED NOT DETECTED Final   Enterotoxigenic  E coli (ETEC) NOT DETECTED NOT DETECTED Final   Shiga like toxin producing E coli (STEC) NOT DETECTED NOT DETECTED Final   Shigella/Enteroinvasive E coli (EIEC) NOT DETECTED  NOT DETECTED Final   Cryptosporidium NOT DETECTED NOT DETECTED Final   Cyclospora cayetanensis NOT DETECTED NOT DETECTED Final   Entamoeba histolytica NOT DETECTED NOT DETECTED Final   Giardia lamblia NOT DETECTED NOT DETECTED Final   Adenovirus F40/41 NOT DETECTED NOT DETECTED Final   Astrovirus NOT DETECTED NOT DETECTED Final   Norovirus GI/GII NOT DETECTED NOT DETECTED Final   Rotavirus A NOT DETECTED NOT DETECTED Final   Sapovirus (I, II, IV, and V) NOT DETECTED NOT DETECTED Final    Comment: Performed at [facility], [address] [place], [place] [address]  C Difficile Quick Screen (NO PCR Reflex)     Status: None   Collection Time: 04/28/20  7:30 AM   Specimen: Rectum; Stool  Result Value Ref Range Status   C Diff antigen NEGATIVE NEGATIVE Final   C Diff toxin NEGATIVE NEGATIVE Final   C Diff interpretation No C. difficile detected.  Final    Comment: Performed at [facility], [address] [place], [place] [address]         Radiology Studies: DG Chest 1 View  Result Date: 04/30/2020 CLINICAL DATA:  COVID-19 positive, follow-up EXAM: CHEST  1 VIEW COMPARISON:  Portable exam 1209 hours compared to 04/25/2020 FINDINGS: Enlargement of cardiac silhouette. Stable mediastinal contours. Patchy BILATERAL pulmonary infiltrates consistent with multifocal pneumonia and COVID-19, greatest in RIGHT upper lobe. Infiltrates appear mildly increased since previous exam. No pleural effusion or pneumothorax. Bones demineralized. IMPRESSION: Patchy BILATERAL pulmonary infiltrates mildly increased since previous exam consistent with multifocal pneumonia and history of COVID-19. Electronically Signed   By: Lavonia Dana M.D.   On: 04/30/2020 14:08        Scheduled Meds: . allopurinol  300 mg Oral Daily  . amiodarone  200 mg Oral Daily  . vitamin C  500 mg Oral Daily  . aspirin EC  81 mg Oral Daily  . atorvastatin  80 mg Oral Daily  . benzonatate  200 mg Oral TID  .  Chlorhexidine Gluconate Cloth  6 each Topical Daily  . enoxaparin (LOVENOX) injection  70 mg Subcutaneous Q24H  . famotidine  20 mg Oral BID  . feeding supplement  237 mL Oral TID BM  . insulin aspart  0-20 Units Subcutaneous TID WC  . insulin aspart  0-5 Units Subcutaneous QHS  . insulin aspart  4 Units Subcutaneous TID WC  . insulin detemir  18 Units Subcutaneous BID  . Ipratropium-Albuterol  1 puff Inhalation BID  . lactulose  20 g Oral TID  . methylPREDNISolone (SOLU-MEDROL) injection  125 mg Intravenous Q12H  . metoprolol tartrate  12.5 mg Oral BID  . multivitamin with minerals  1 tablet Oral Daily  . tamsulosin  0.4 mg Oral QPC supper  . trimethoprim-polymyxin b  1 drop Both Eyes Q6H  . zinc sulfate  220 mg Oral Daily   Continuous Infusions: . sodium chloride Stopped (04/25/20 1659)  . cefTRIAXone (ROCEPHIN)  IV 2 g (05/01/20 1244)  . lactated ringers 75 mL/hr at 05/01/20 1242     LOS: 11 days    Time spent: 35 minutes    Jacere Pangborn Darleen Crocker, DO [facility]  If 7PM-7AM, please contact night-coverage [email or web address] 05/01/2020, 1:17 PM

## 2020-05-01 NOTE — Plan of Care (Signed)
  Problem: Acute Rehab PT Goals(only PT should resolve) Goal: Pt Will Go Supine/Side To Sit Outcome: Progressing Flowsheets (Taken 05/01/2020 1527) Pt will go Supine/Side to Sit:  with minimal assist  with moderate assist Goal: Patient Will Transfer Sit To/From Stand Outcome: Progressing Flowsheets (Taken 05/01/2020 1527) Patient will transfer sit to/from stand: with moderate assist Goal: Pt Will Transfer Bed To Chair/Chair To Bed Outcome: Progressing Flowsheets (Taken 05/01/2020 1527) Pt will Transfer Bed to Chair/Chair to Bed: with mod assist Goal: Pt Will Ambulate Outcome: Progressing Flowsheets (Taken 05/01/2020 1527) Pt will Ambulate:  10 feet  with moderate assist  with rolling walker   3:28 PM, 05/01/20 Lonell Grandchild, MPT Physical Therapist with [facility] [phone] office [phone] mobile phone

## 2020-05-02 ENCOUNTER — Inpatient Hospital Stay (HOSPITAL_COMMUNITY): Payer: Medicare (Managed Care)

## 2020-05-02 LAB — CBC
HCT: 28.1 % — ABNORMAL LOW (ref 39.0–52.0)
Hemoglobin: 7.8 g/dL — ABNORMAL LOW (ref 13.0–17.0)
MCH: 29.8 pg (ref 26.0–34.0)
MCHC: 27.8 g/dL — ABNORMAL LOW (ref 30.0–36.0)
MCV: 107.3 fL — ABNORMAL HIGH (ref 80.0–100.0)
Platelets: 117 10*3/uL — ABNORMAL LOW (ref 150–400)
RBC: 2.62 MIL/uL — ABNORMAL LOW (ref 4.22–5.81)
RDW: 16.4 % — ABNORMAL HIGH (ref 11.5–15.5)
WBC: 134.6 10*3/uL (ref 4.0–10.5)
nRBC: 0 % (ref 0.0–0.2)

## 2020-05-02 LAB — GLUCOSE, CAPILLARY
Glucose-Capillary: 234 mg/dL — ABNORMAL HIGH (ref 70–99)
Glucose-Capillary: 264 mg/dL — ABNORMAL HIGH (ref 70–99)
Glucose-Capillary: 218 mg/dL — ABNORMAL HIGH (ref 70–99)
Glucose-Capillary: 137 mg/dL — ABNORMAL HIGH (ref 70–99)

## 2020-05-02 LAB — COMPREHENSIVE METABOLIC PANEL
ALT: 36 U/L (ref 0–44)
AST: 47 U/L — ABNORMAL HIGH (ref 15–41)
Albumin: 1.9 g/dL — ABNORMAL LOW (ref 3.5–5.0)
Alkaline Phosphatase: 64 U/L (ref 38–126)
Anion gap: 9 (ref 5–15)
BUN: 38 mg/dL — ABNORMAL HIGH (ref 8–23)
CO2: 26 mmol/L (ref 22–32)
Calcium: 7.8 mg/dL — ABNORMAL LOW (ref 8.9–10.3)
Chloride: 110 mmol/L (ref 98–111)
Creatinine, Ser: 1.12 mg/dL (ref 0.61–1.24)
GFR, Estimated: >60 mL/min (ref >60)
Glucose, Bld: 248 mg/dL — ABNORMAL HIGH (ref 70–99)
Potassium: 4.1 mmol/L (ref 3.5–5.1)
Sodium: 145 mmol/L (ref 135–145)
Total Bilirubin: 0.9 mg/dL (ref 0.3–1.2)
Total Protein: 5.8 g/dL — ABNORMAL LOW (ref 6.5–8.1)

## 2020-05-02 LAB — AMMONIA: Ammonia: 39 umol/L — ABNORMAL HIGH (ref 9–35)

## 2020-05-02 LAB — MAGNESIUM: Magnesium: 2.8 mg/dL — ABNORMAL HIGH (ref 1.7–2.4)

## 2020-05-02 IMAGING — DX DG CHEST 1V PORT
2 series · 2 of 2 positions shown · non-contrast
Comparison: [DATE].

CLINICAL DATA: Shortness of breath.

EXAM:
PORTABLE CHEST 1 VIEW

[chest ap (1 of 2)]
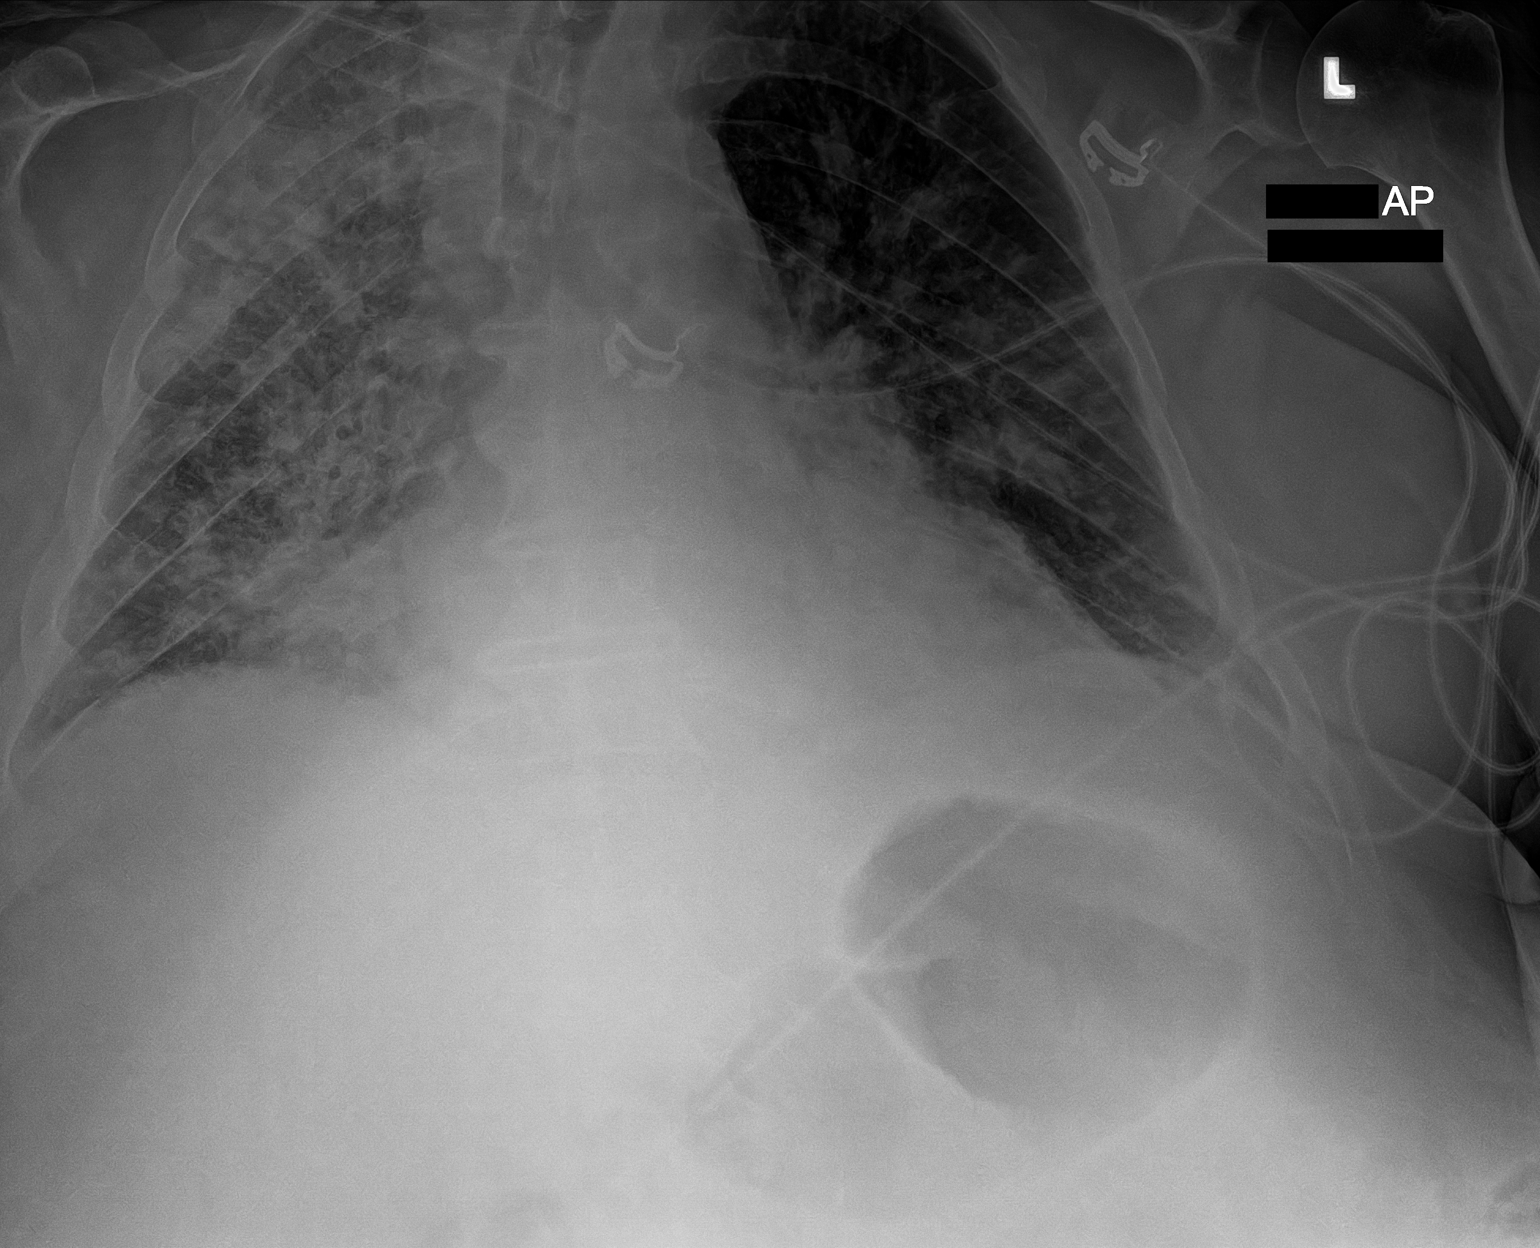

[chest ap (2 of 2)]
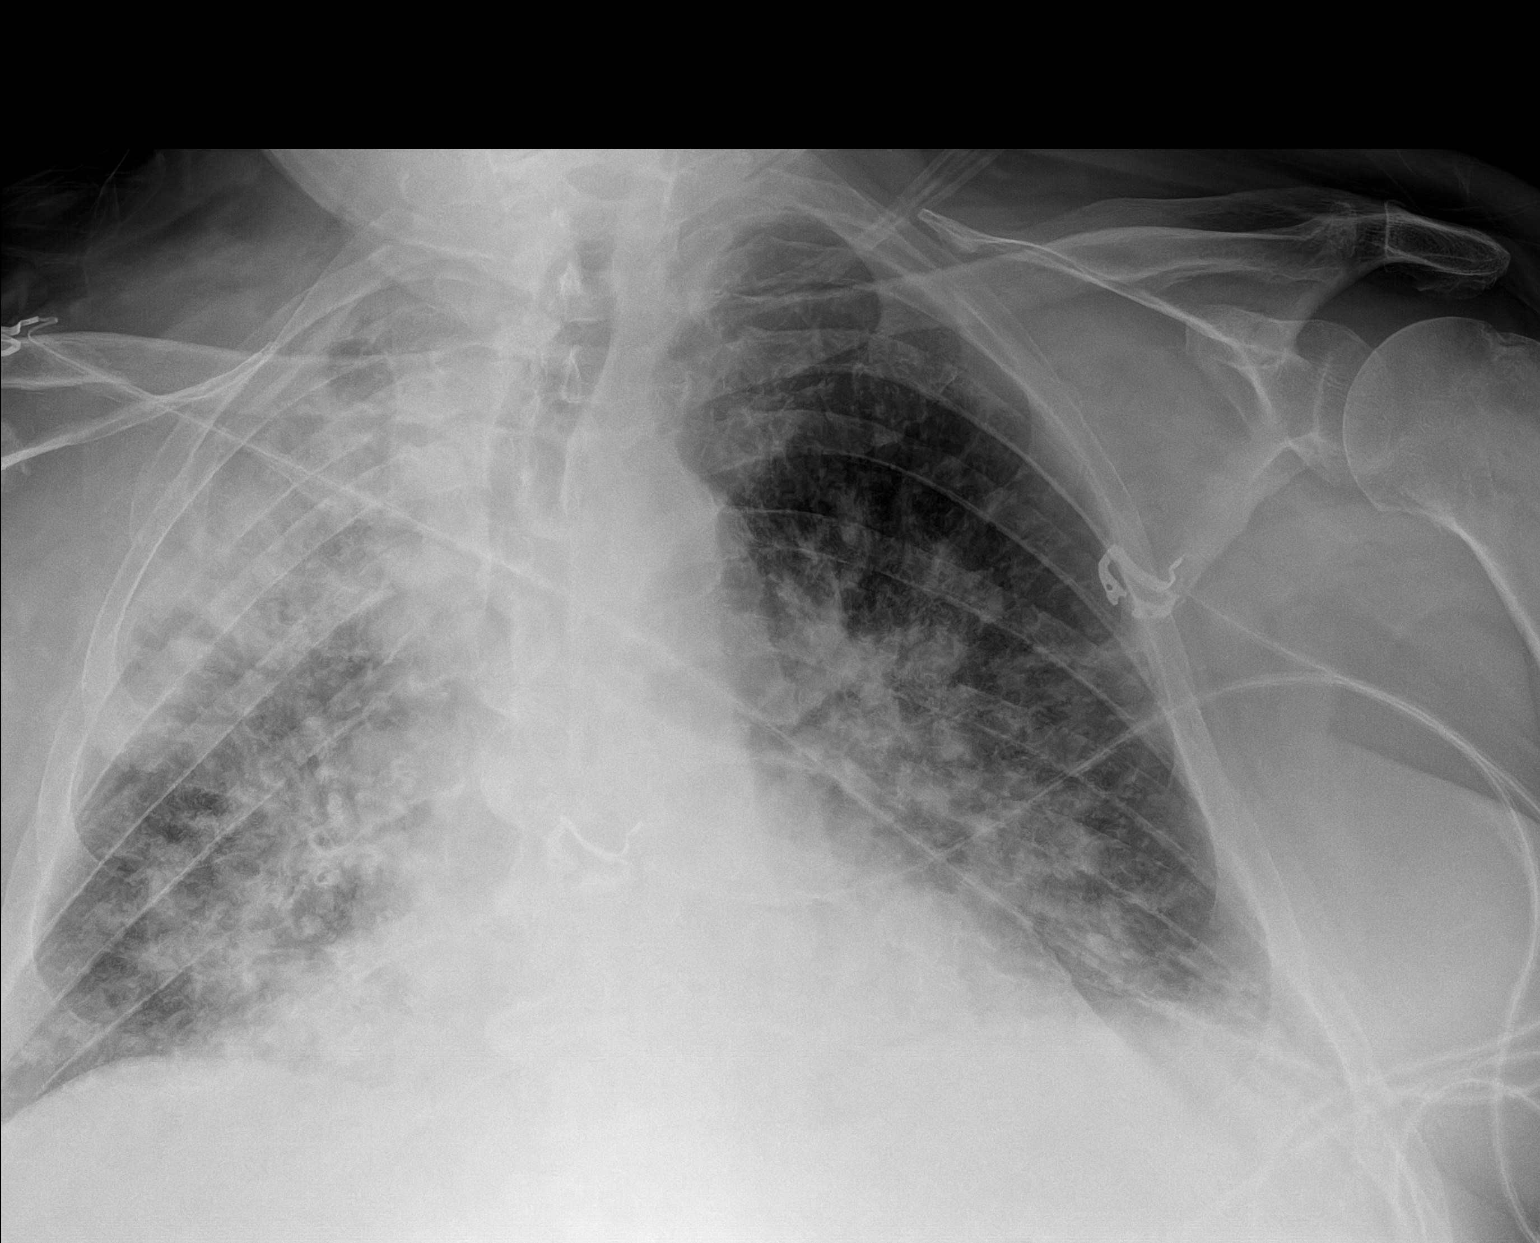

[2 of 2 positions shown; findings below may reference images not displayed]

FINDINGS: Stable cardiomediastinal silhouette. Stable bilateral lung opacities
are noted, right greater than left, concerning for multifocal
pneumonia. No pneumothorax or significant pleural effusion is noted.
Bony thorax is unremarkable.
IMPRESSION: Stable bilateral lung opacities, right greater than left, concerning
for multifocal pneumonia.

## 2020-05-02 MED ORDER — INSULIN DETEMIR 100 UNIT/ML ~~LOC~~ SOLN
23.0000 [IU] | Freq: Two times a day (BID) | SUBCUTANEOUS | Status: DC
  Administered 2020-05-02: 23 [IU] via SUBCUTANEOUS
  Filled 2020-05-02 (×5): qty 0.23

## 2020-05-02 MED ORDER — FUROSEMIDE 10 MG/ML IJ SOLN
40.0000 mg | Freq: Two times a day (BID) | INTRAMUSCULAR | Status: AC
  Administered 2020-05-02 (×2): 40 mg via INTRAVENOUS
  Filled 2020-05-02 (×2): qty 4

## 2020-05-02 NOTE — Progress Notes (Signed)
Looked at patient's CXR, which looks bad on right side.  Placed patient on his left side and have put in a call to MD to try and possibly get some CPT started on patient to see if it helps.  Informed RN to let patient stay on side if sats hold for around 30 minutes and we will try CPT after that.  Will continue to monitor.

## 2020-05-02 NOTE — Progress Notes (Signed)
Had trouble keeping patient's sats up tonight.  Had to place patient on Heated HFNC with NRB to get patient's sats up above 85%.  Patient is currently on 40L at 100% FIO2 with a NRB and current sat is back up to 88 to 89%.  Will continue to monitor.

## 2020-05-02 NOTE — Progress Notes (Signed)
Palliative: Tim Duncan is lying quietly in bed.  He appears acutely/chronically ill and very frail.  He is respiratory status is tenuous, requiring 100% oxygen with saturations in the mid 80s.  He is oriented to person and place, able to make his basic needs known.  There is no family at bedside at this time due to visitor restrictions.  At times Tim Duncan is resting comfortably.  Other times he wakes up and states, "help me, help me".  Although he shares that he is tired, he tells the palliative team and bedside nursing staff that he wants to continue to try to improve.    Call to friend/HC POA, Sherrilee Gilles. We talk about Tim Duncan's respiratory status, his 100% FiO2 with saturations in the mid 80s.  We talked about no meaningful improvement so far, but Tim Duncan continues to wax and wane, stating that he is tired, asking for help, but still wanting to try to improve.  I shared that the next few days may be an indicator of his ability to recover, if at all.  I share that he could suddenly pass.  Roselyn Reef states that she has been waiting for a call stating that he has passed.  We talk about time for outcomes, and Tim Duncan goals.  I shared that if he were to say "enough is enough", we would focus on comfort and dignity, let nature take its course.  Roselyn Reef asks appropriate questions, and we talked about comfort measures only.  Roselyn Reef states that she would want to be present, if at all possible.  Conference with attending, bedside nursing staff, transition of care team related to patient condition, needs, goals of care. PMT to continue to follow.  Plan: At this point continue to treat the treatable but no CPR or intubation.  Tim Duncan is not ready for comfort measures only at this time.  17 minutes [name], NP Palliative medicine team Team phone [phone] Greater than 50% of this time was spent counseling and coordinating care related to the above assessment and plan.

## 2020-05-02 NOTE — Progress Notes (Addendum)
Did CPT and patient laid his arm up on mine.  Patient felt warm.  Checked last temp which was 99.1.  Made some ice packs and placed under patient's arms to help with fever.  Patient sat has been dropping some.  Patient now at sat of 81% on [place] 15L and NRB.

## 2020-05-02 NOTE — Progress Notes (Signed)
Inpatient Diabetes Program Recommendations  AACE/ADA: New Consensus Statement on Inpatient Glycemic Control   Target Ranges:  Prepandial:   less than 140 mg/dL      Peak postprandial:   less than 180 mg/dL (1-2 hours)      Critically ill patients:  140 - 180 mg/dL  Results for TRINTEN, BOUDOIN (MRN [id number]) as of 05/02/2020 08:08  Ref. Range 05/02/2020 05:22  Glucose Latest Ref Range: 70 - 99 mg/dL 248 (H)   Results for ELUTERIO, SEYMOUR (MRN [id number]) as of 05/02/2020 08:08  Ref. Range 05/01/2020 07:56 05/01/2020 11:39 05/01/2020 16:14 05/01/2020 20:07  Glucose-Capillary Latest Ref Range: 70 - 99 mg/dL 288 (H) 281 (H) 238 (H) 220 (H)   Review of Glycemic Control  Diabetes history:DM2 Outpatient Diabetes medications:Lantus 30 units daily, Novolog 0-9 units TID Current orders for Inpatient glycemic control:Levemir 18 units BID, Novolog 0-20 units TID with meals, Novolog 0-5 units QHS, Novolog 4 units TID with meals for meal coverage; Solumedrol 125 mg Q12H  Inpatient Diabetes Program Recommendations:  Insulin:If steroids are continued as ordered, please considerincreasing Levemir to 23 units BID .  Thanks, Barnie Alderman, RN, MSN, CDE Diabetes Coordinator Inpatient Diabetes Program [phone] (Team Pager from 8am to 5pm)

## 2020-05-02 NOTE — Evaluation (Signed)
Clinical/Bedside Swallow Evaluation Patient Details  Name: Tim Duncan MRN: [id number] Date of Birth: May 24, 1950  Today's Date: 05/02/2020 Time: SLP Start Time (ACUTE ONLY): 1330 SLP Stop Time (ACUTE ONLY): 1353 SLP Time Calculation (min) (ACUTE ONLY): 23 min  Past Medical History:  Past Medical History:  Diagnosis Date  . Anemia 08/26/2012  . BPH (benign prostatic hyperplasia)   . Cancer ([place])    cll  . CLL (chronic lymphocytic leukemia) ([place])   . Hypertension    off meds  . Lymphadenopathy   . Lymphocytosis   . Thrombocytosis   . Tremors of nervous system   . Wears dentures    top   Past Surgical History:  Past Surgical History:  Procedure Laterality Date  . CATARACT EXTRACTION W/PHACO Right 06/21/2014   Procedure: CATARACT EXTRACTION PHACO AND INTRAOCULAR LENS PLACEMENT; CDE:  11.10;  Surgeon: Rutherford Guys, MD;  Location: [facility];  Service: Ophthalmology;  Laterality: Right;  . CATARACT EXTRACTION W/PHACO Left 07/12/2014   Procedure: CATARACT EXTRACTION PHACO AND INTRAOCULAR LENS PLACEMENT (IOC);  Surgeon: Rutherford Guys, MD;  Location: [facility];  Service: Ophthalmology;  Laterality: Left;  CDE:7.52  . ENDOVENOUS ABLATION SAPHENOUS VEIN W/ LASER Right 12-08-2014   endovenous laser ablation (right greater saphenous vein) by Curt Jews MD   . FRACTURE SURGERY  1990   rt ring finger  . HERNIA REPAIR  1992   umb  . ORIF ANKLE FRACTURE  1995   right  . PORTACATH PLACEMENT  3/14  . SYNOVECTOMY Right 12/15/2013   Procedure: RIGHT RING FINGER FLEXOR SYNOVECTOMY;  Surgeon: Charlotte Crumb, MD;  Location: [facility];  Service: Orthopedics;  Laterality: Right;  . TONSILLECTOMY     HPI:  Tim Duncan is a 70 y.o. male with medical history significant of hypertension, BPH, atrial fibrillation (not chronically on anticoagulation), chronic lymphocytic leukemia (actively follow by oncology service at [facility]), hyperlipidemia, morbid obesity and diabetes; who presented to  the emergency department secondary to general malaise, shortness of breath and intermittent coughing spells.  Patient symptoms have been present for the last 2 days or so and was tested and found to be positive for COVID.  He is a skilled nursing facility oxygen saturation on room air was around 75%.  Patient reports chronic diarrhea, no nausea or vomiting.  Expressed having chills.  Nonproductive coughing spells and shortness of breath at rest on exertion.  No hemoptysis.   Assessment / Plan / Recommendation Clinical Impression  Pt presents with respiratory compromise which negatively impacts safe po intake. Pt on HFNC and NRBM FiO2 100% and O2 sats vary from 86-90. Oral cavity is dry and Pt repeatedly yells for "water". He requires verbal cues to close lips around spoon and while manipulating ice chips and teaspoons of water. Pt with variable strong cough response immediately after trials of water and less so with NTL. Pt consumed several bites of puree and this was left in his room for medication administration if needed. Pt is at high risk for aspiration due to respiratory compromise and seeming difficulty coordinating swallow with breathing. Pt is not on comfort measures at this time, so would pursue NPO except for medication whole via puree and ice chips after oral care for comfort and SLP to reasses tomorrow. Consider allowing free liquids if comfort measures pursued as this is what Pt is asking for. Above to RN. SLP will follow. SLP Visit Diagnosis: Dysphagia, unspecified (R13.10)    Aspiration Risk  Moderate aspiration risk;Risk  for inadequate nutrition/hydration    Diet Recommendation Ice chips PRN after oral care;NPO except meds (meds in puree)   Medication Administration: Whole meds with puree Supervision: Staff to assist with self feeding Postural Changes: Remain upright for at least 30 minutes after po intake;Seated upright at 90 degrees    Other  Recommendations Oral Care  Recommendations: Oral care prior to ice chip/H20;Staff/trained caregiver to provide oral care   Follow up Recommendations Skilled Nursing facility      Frequency and Duration min 2x/week  1 week       Prognosis Prognosis for Safe Diet Advancement: Guarded Barriers to Reach Goals:  (respiratory status)      Swallow Study   General Date of Onset: 04/09/2020 HPI: Tim Duncan is a 70 y.o. male with medical history significant of hypertension, BPH, atrial fibrillation (not chronically on anticoagulation), chronic lymphocytic leukemia (actively follow by oncology service at [facility]), hyperlipidemia, morbid obesity and diabetes; who presented to the emergency department secondary to general malaise, shortness of breath and intermittent coughing spells.  Patient symptoms have been present for the last 2 days or so and was tested and found to be positive for COVID.  He is a skilled nursing facility oxygen saturation on room air was around 75%.  Patient reports chronic diarrhea, no nausea or vomiting.  Expressed having chills.  Nonproductive coughing spells and shortness of breath at rest on exertion.  No hemoptysis. Type of Study: Bedside Swallow Evaluation Diet Prior to this Study: Regular;Thin liquids Temperature Spikes Noted: Yes Respiratory Status: Nasal cannula;Non-rebreather (Fi02 100, sats 86-90) History of Recent Intubation: No Behavior/Cognition: Alert;Cooperative Oral Cavity Assessment: Within Functional Limits;Dry Oral Care Completed by SLP: Yes Vision: Impaired for self-feeding Self-Feeding Abilities: Total assist Patient Positioning: Upright in bed Baseline Vocal Quality: Normal Volitional Cough: Strong Volitional Swallow: Able to elicit    Oral/Motor/Sensory Function Overall Oral Motor/Sensory Function: Generalized oral weakness (verbal cues to close lips together with po)   Ice Chips Ice chips: Impaired Presentation: Spoon Oral Phase Impairments: Reduced labial  seal Pharyngeal Phase Impairments: Cough - Immediate;Multiple swallows   Thin Liquid Thin Liquid: Impaired Presentation: Spoon;Straw Pharyngeal  Phase Impairments: Multiple swallows;Cough - Immediate;Cough - Delayed    Nectar Thick Nectar Thick Liquid: Impaired Presentation: Straw Pharyngeal Phase Impairments: Cough - Delayed   Honey Thick Honey Thick Liquid: Not tested   Puree Puree: Within functional limits Presentation: Spoon   Solid     Solid: Not tested (due to respiratory compromise)     Thank you,  Genene Churn, [place]  PORTER,DABNEY 05/02/2020,5:35 PM

## 2020-05-02 NOTE — Progress Notes (Signed)
PROGRESS NOTE    Tim Duncan  [id number] DOB: 1950-11-19 DOA: 04/19/2020 PCP: Celene Squibb, MD   Brief Narrative:  Tim Duncan a 70 Duncan medical history significant ofhypertension, BPH, atrial fibrillation (not chronically on anticoagulation), chronic lymphocytic leukemia (actively follow by oncology service at [facility]), hyperlipidemia, morbid obesity and diabetes; who presented to the emergency department secondary to general malaise, shortness of breath and intermittent coughing spells. Patient symptoms have been present for the last 2 days or so and was tested and found to be positive for COVID. He is a skilled nursing facility oxygen saturation on room air was around 75%. Patient reports chronic diarrhea, no nausea or vomiting. Expressed having chills. Nonproductive coughing spells and shortness of breath at rest on exertion. No hemoptysis. Of note, patient expressed to be vaccinated with Wynetta Emery & Wynetta Emery vaccine in the past.   ED Course:Inflammatory markers corroborating acute COVID infection; bilateral infiltrates appreciated on all his lungs characteristic for COVID. IV steroids and remdesivir's initiated. [facility] has been contacted to place patient in the hospital for acute respiratory failure with hypoxia in the setting of COVID-pneumonia. Patientwasrequiring 4 L nasal cannula supplementation.   Assessment & Plan:   Active Problems:   Acute on chronic respiratory failure with hypoxia (HCC)  Acute on chronic hypoxemic respiratory failure ongoing -Currently on 15L nasal cannula oxygen with NRB mask -Continue to follow inflammatory markers -Completedremdesivir infusion 04/24/20. -Baricitinib discontinued 1/27 -Resumethe use of IV steroids, restarted 1/27 -Continue to follow vitamin C and zinc -Will continue as needed bronchodilator, proning position, incentive spirometer and flutter valve -Follow clinical response and continue supportive  care. -Continue as needed antitussive medications. -Patient willremain in the stepdown unit; incase he endedrequiringheated high flow oxygen supplementation -Appears to have some volume overload on 2/1 for which IVF discontinued and Lasix for 2 doses given  Acute encephalopathy-multifactorial, improving -Appears to be related to hepatic encephalopathy with some mild elevation in ammonia levels for which lactulose started, follow trend -TSH within normal limits -Likely related to critical illness  Strep pneumonia bacteremia with ongoing fevers -Continue Rocephin for now day 7 due to ongoing fevers and elevated WBC count -Repeat Blood cultures today -Procalcitonin downtrending  E. coli and Proteus UTI -Rocephin to continue for now as noted above  Ongoing diarrhea-improving -C. difficile negative and GI panelnegative  History of paroxysmal atrial fibrillation -Sinus rhythm and rate controlled -Continue amiodarone and beta-blocker.  CLL -WBCs in the130s range currently -Continue to follow trendand continue to monitor procalcitonin for better guidance which has been downtrending -Case discussed with oncology service (Dr. Delton Coombes) and recommendations given toholdvenetoclax while acutely ill; may resume on dc  Type 2 diabetes-with hyperglycemia -Continue sliding scale insulin and Levemir -Follow CBGs and adjust hypoglycemic regimen as needed.  Gastroesophageal reflux disease/GI prophylaxis -Change PPI to H2 blocker  Essential hypertension -Currently stable and well-controlled -Followed by -Continue adjusted dose of antihypertensive regimen.  Acute kidney injury-improving -Urine outputadequate -In the setting of dehydration and prerenal azotemia -UA not demonstrating acute infection. -Continue to minimize the use of nephrotoxic agents and avoid hypotension -Maintain adequate hydration -Renal functionstable  Hyperlipidemia -Continue  statins.  History of BPH -Continue Flomax.  Morbid obesity -Body mass index is 42.01 kg/m. -Low-calorie diet, portion control increase physical activity discussed with patient.   DVT prophylaxis:Lovenox Code Status:Full code. Family Communication:Discussed with friend who is the power of attorney on phone 2/1 Disposition:  Status is: Inpatient  Dispo: The patient is from: Skilled nursing facility Anticipated d/c  is to: Skilled nursing facilityvs home hospice/facility Anticipated d/c date is: To be determined Patient currentlynotmedically stable for discharge; still requiring oxygen supplementation. Complaining of feeling short winded and receiving IV therapy. Requiring 15L nasal cannula supplementation and nonrebreather currently.    Consultants:  Oncology service curbside: Dr. Delton Coombes.  Palliative care   Procedures: See below for x-ray reports  Antimicrobials:  Anti-infectives (From admission, onward)   Start     Dose/Rate Route Frequency Ordered Stop   04/26/20 1200  cefTRIAXone (ROCEPHIN) 2 g in sodium chloride 0.9 % 100 mL IVPB        2 g 200 mL/hr over 30 Minutes Intravenous Every 24 hours 04/26/20 0848     04/25/20 1630  piperacillin-tazobactam (ZOSYN) IVPB 3.375 g  Status:  Discontinued        3.375 g 12.5 mL/hr over 240 Minutes Intravenous Every 8 hours 04/25/20 1551 04/26/20 0848   04/21/20 1000  remdesivir 100 mg in sodium chloride 0.9 % 100 mL IVPB        100 mg 200 mL/hr over 30 Minutes Intravenous Daily May 06, 2020 1108 04/24/20 1835   04/21/20 1000  remdesivir 100 mg in sodium chloride 0.9 % 100 mL IVPB  Status:  Discontinued       "Followed by" Linked Group Details   100 mg 200 mL/hr over 30 Minutes Intravenous Daily 05/06/2020 1139 05-06-20 1147   05/06/2020 1200  remdesivir 100 mg in sodium chloride 0.9 % 100 mL IVPB        100 mg 200 mL/hr over 30 Minutes Intravenous Every 1 hr x 2 May 06, 2020  1108 May 06, 2020 1315   2020/05/06 1145  remdesivir 200 mg in sodium chloride 0.9% 250 mL IVPB  Status:  Discontinued       "Followed by" Linked Group Details   200 mg 580 mL/hr over 30 Minutes Intravenous Once May 06, 2020 1139 May 06, 2020 1147   05-06-20 1141  valACYclovir (VALTREX) tablet 1,000 mg        1,000 mg Oral Daily PRN May 06, 2020 1142         Subjective: Patient seen and evaluated today with ongoing dyspnea noted. Continues to have low oxygen saturations.  Objective: Vitals:   05/02/20 0800 05/02/20 0817 05/02/20 0900 05/02/20 1245  BP: (!) 161/64     Pulse: 84     Resp: (!) 33     Temp:   99.5 F (37.5 C) (!) 100.5 F (38.1 C)  TempSrc:   Axillary Axillary  SpO2: (!) 87% (!) 85%    Weight:      Height:        Intake/Output Summary (Last 24 hours) at 05/02/2020 1601 Last data filed at 05/02/2020 0430 Gross per 24 hour  Intake --  Output 1350 ml  Net -1350 ml   Filed Weights   04/27/20 0400 05/01/20 0500 05/02/20 0430  Weight: 135.8 kg (!) 137.3 kg (!) 137.5 kg    Examination:  General exam: Appears in minimal distress Respiratory system: Clear to auscultation. Respiratory effort increased. On 15L [place] and NRB. Cardiovascular system: S1 & S2 heard, RRR.  Gastrointestinal system: Abdomen is obese Central nervous system: Alert and awake Extremities: 1+ bilateral LE edema. Skin: No significant lesions noted Psychiatry: Flat affect.    Data Reviewed: I have personally reviewed following labs and imaging studies  CBC: Recent Labs  Lab 04/27/20 0430 04/28/20 0621 04/29/20 0639 04/30/20 0500 05/01/20 0552 05/02/20 0522  WBC 98.9* 110.8* 119.4* 108.1* 135.2* 134.6*  NEUTROABS 14.8* 12.2* 10.7* 8.6* 9.5*  --  HGB 8.3* 9.6* 8.4* 8.0* 8.2* 7.8*  HCT 28.6* 32.9* 29.2* 28.6* 29.0* 28.1*  MCV 104.8* 107.5* 107.0* 107.9* 107.8* 107.3*  PLT 152 143* 134* 120* 129* 123XX123*   Basic Metabolic Panel: Recent Labs  Lab 04/27/20 0430 04/28/20 0621 04/29/20 0639  04/30/20 0500 05/01/20 0552 05/02/20 0522  NA 141 143 141 142 140 145  K 3.2* 4.0 3.9 4.5 3.9 4.1  CL 106 106 107 108 106 110  CO2 26 24 24 26 24 26   GLUCOSE 54* 323* 173* 329* 292* 248*  BUN 19 29* 47* 52* 43* 38*  CREATININE 1.15 1.40* 1.66* 1.59* 1.31* 1.12  CALCIUM 7.1* 7.7* 7.6* 7.7* 7.7* 7.8*  MG 2.9*  --  3.3* 3.5* 3.1* 2.8*   GFR: Estimated Creatinine Clearance: 90.7 mL/min (by C-G formula based on SCr of 1.12 mg/dL). Liver Function Tests: Recent Labs  Lab 04/28/20 0621 04/29/20 0639 04/30/20 0500 05/01/20 0552 05/02/20 0522  AST 44* 38 38 41 47*  ALT 29 29 29 31  36  ALKPHOS 72 60 55 61 64  BILITOT 0.7 0.8 0.8 0.7 0.9  PROT 6.9 6.2* 5.7* 5.8* 5.8*  ALBUMIN 2.4* 2.1* 1.9* 2.1* 1.9*   No results for input(s): LIPASE, AMYLASE in the last 168 hours. Recent Labs  Lab 04/27/20 0430 04/29/20 0639 04/30/20 0500 05/01/20 0552 05/02/20 0522  AMMONIA 54* 39* 35 36* 39*   Coagulation Profile: No results for input(s): INR, PROTIME in the last 168 hours. Cardiac Enzymes: No results for input(s): CKTOTAL, CKMB, CKMBINDEX, TROPONINI in the last 168 hours. BNP (last 3 results) No results for input(s): PROBNP in the last 8760 hours. HbA1C: No results for input(s): HGBA1C in the last 72 hours. CBG: Recent Labs  Lab 05/01/20 1139 05/01/20 1614 05/01/20 2007 05/02/20 0807 05/02/20 1218  GLUCAP 281* 238* 220* 234* 264*   Lipid Profile: No results for input(s): CHOL, HDL, LDLCALC, TRIG, CHOLHDL, LDLDIRECT in the last 72 hours. Thyroid Function Tests: No results for input(s): TSH, T4TOTAL, FREET4, T3FREE, THYROIDAB in the last 72 hours. Anemia Panel: Recent Labs    04/30/20 0500 05/01/20 0552  FERRITIN 1,762* 1,931*   Sepsis Labs: Recent Labs  Lab 04/27/20 0430 04/28/20 0621 04/29/20 0639 04/30/20 0500  PROCALCITON 6.86 4.54 2.83 1.08    Recent Results (from the past 240 hour(s))  Urine Culture     Status: Abnormal   Collection Time: 04/25/20 11:40 AM    Specimen: Urine, Clean Catch  Result Value Ref Range Status   Specimen Description   Final    URINE, CLEAN CATCH Performed at [facility], [address], [place], [place] [address]    Special Requests   Final    NONE Performed at [facility], [address], [place], [place] [address]    Culture (A)  Final    >=100,000 COLONIES/mL ESCHERICHIA COLI 50,000 COLONIES/mL PROTEUS MIRABILIS    Report Status 04/28/2020 FINAL  Final   Organism ID, Bacteria ESCHERICHIA COLI (A)  Final   Organism ID, Bacteria PROTEUS MIRABILIS (A)  Final      Susceptibility   Escherichia coli - MIC*    AMPICILLIN 4 SENSITIVE Sensitive     CEFAZOLIN <=4 SENSITIVE Sensitive     CEFEPIME <=0.12 SENSITIVE Sensitive     CEFTRIAXONE <=0.25 SENSITIVE Sensitive     CIPROFLOXACIN <=0.25 SENSITIVE Sensitive     GENTAMICIN <=1 SENSITIVE Sensitive     IMIPENEM <=0.25 SENSITIVE Sensitive     NITROFURANTOIN <=16 SENSITIVE Sensitive     TRIMETH/SULFA <=  20 SENSITIVE Sensitive     AMPICILLIN/SULBACTAM <=2 SENSITIVE Sensitive     PIP/TAZO <=4 SENSITIVE Sensitive     * >=100,000 COLONIES/mL ESCHERICHIA COLI   Proteus mirabilis - MIC*    AMPICILLIN <=2 SENSITIVE Sensitive     CEFAZOLIN <=4 SENSITIVE Sensitive     CEFEPIME <=0.12 SENSITIVE Sensitive     CEFTRIAXONE <=0.25 SENSITIVE Sensitive     CIPROFLOXACIN <=0.25 SENSITIVE Sensitive     GENTAMICIN <=1 SENSITIVE Sensitive     IMIPENEM 2 SENSITIVE Sensitive     NITROFURANTOIN 128 RESISTANT Resistant     TRIMETH/SULFA <=20 SENSITIVE Sensitive     AMPICILLIN/SULBACTAM <=2 SENSITIVE Sensitive     PIP/TAZO <=4 SENSITIVE Sensitive     * 50,000 COLONIES/mL PROTEUS MIRABILIS  Culture, blood (Routine X 2) w Reflex to ID Panel     Status: Abnormal   Collection Time: 04/25/20 12:46 PM   Specimen: BLOOD  Result Value Ref Range Status   Specimen Description   Final    BLOOD LEFT ANTECUBITAL Performed at [facility], [address] [place], [place] [address]    Special  Requests   Final    Blood Culture adequate volume BOTTLES DRAWN AEROBIC AND ANAEROBIC Performed at [facility], [address], [place], [place] [address]    Culture  Setup Time   Final    IN BOTH AEROBIC AND ANAEROBIC BOTTLES GRAM POSITIVE COCCI IN PAIRS Gram Stain Report Called to,Read Back By and Verified With: J HEARN,RN@0016  04/26/20 MKELLY CRITICAL VALUE NOTED.  VALUE IS CONSISTENT WITH PREVIOUSLY REPORTED AND CALLED VALUE.    Culture (A)  Final    STREPTOCOCCUS PNEUMONIAE SUSCEPTIBILITIES PERFORMED ON PREVIOUS CULTURE WITHIN THE LAST 5 DAYS. Performed at [facility], [place] [address], [place], [place] [address]    Report Status 04/28/2020 FINAL  Final  Culture, blood (Routine X 2) w Reflex to ID Panel     Status: Abnormal   Collection Time: 04/25/20 12:47 PM   Specimen: BLOOD  Result Value Ref Range Status   Specimen Description   Final    BLOOD LEFT ANTECUBITAL Performed at [facility], [address] [place], [place] [address]    Special Requests   Final    BOTTLES DRAWN AEROBIC AND ANAEROBIC Blood Culture results may not be optimal due to an inadequate volume of blood received in culture bottles Performed at [facility], [address], [place], [place] [address]    Culture  Setup Time   Final    AEROBIC BOTTLE ONLY GRAM POSITIVE COCCI IN PAIRS Gram Stain Report Called to,Read Back By and Verified With: J HEARN,RN 0016 04/26/20 MKELLY CRITICAL RESULT CALLED TO, READ BACK BY AND VERIFIED WITH: J,HEARN RN @0504  04/26/20 EB Performed at [facility], [place] [address] [place], [place] [address]    Culture STREPTOCOCCUS PNEUMONIAE (A)  Final   Report Status 04/28/2020 FINAL  Final   Organism ID, Bacteria STREPTOCOCCUS PNEUMONIAE  Final      Susceptibility   Streptococcus pneumoniae - MIC*    ERYTHROMYCIN <=0.12 SENSITIVE Sensitive     LEVOFLOXACIN 0.5 SENSITIVE Sensitive     VANCOMYCIN 0.5 SENSITIVE Sensitive     PENO - penicillin <=0.06      PENICILLIN  (non-meningitis) <=0.06 SENSITIVE Sensitive     PENICILLIN (oral) <=0.06 SENSITIVE Sensitive     CEFTRIAXONE (non-meningitis) <=0.12 SENSITIVE Sensitive     * STREPTOCOCCUS PNEUMONIAE  Blood Culture ID Panel (Reflexed)     Status: Abnormal  Collection Time: 04/25/20 12:47 PM  Result Value Ref Range Status   Enterococcus faecalis NOT DETECTED NOT DETECTED Final   Enterococcus Faecium NOT DETECTED NOT DETECTED Final   Listeria monocytogenes NOT DETECTED NOT DETECTED Final   Staphylococcus species NOT DETECTED NOT DETECTED Final   Staphylococcus aureus (BCID) NOT DETECTED NOT DETECTED Final   Staphylococcus epidermidis NOT DETECTED NOT DETECTED Final   Staphylococcus lugdunensis NOT DETECTED NOT DETECTED Final   Streptococcus species DETECTED (A) NOT DETECTED Final    Comment: CRITICAL RESULT CALLED TO, READ BACK BY AND VERIFIED WITH: J,HEARN RN @0504  04/26/20 EB    Streptococcus agalactiae NOT DETECTED NOT DETECTED Final   Streptococcus pneumoniae DETECTED (A) NOT DETECTED Final    Comment: CRITICAL RESULT CALLED TO, READ BACK BY AND VERIFIED WITH: J,HEARN RN @0504  04/26/20 EB    Streptococcus pyogenes NOT DETECTED NOT DETECTED Final   A.calcoaceticus-baumannii NOT DETECTED NOT DETECTED Final   Bacteroides fragilis NOT DETECTED NOT DETECTED Final   Enterobacterales NOT DETECTED NOT DETECTED Final   Enterobacter cloacae complex NOT DETECTED NOT DETECTED Final   Escherichia coli NOT DETECTED NOT DETECTED Final   Klebsiella aerogenes NOT DETECTED NOT DETECTED Final   Klebsiella oxytoca NOT DETECTED NOT DETECTED Final   Klebsiella pneumoniae NOT DETECTED NOT DETECTED Final   Proteus species NOT DETECTED NOT DETECTED Final   Salmonella species NOT DETECTED NOT DETECTED Final   Serratia marcescens NOT DETECTED NOT DETECTED Final   Haemophilus influenzae NOT DETECTED NOT DETECTED Final   Neisseria meningitidis NOT DETECTED NOT DETECTED Final   Pseudomonas aeruginosa NOT DETECTED NOT  DETECTED Final   Stenotrophomonas maltophilia NOT DETECTED NOT DETECTED Final   Candida albicans NOT DETECTED NOT DETECTED Final   Candida auris NOT DETECTED NOT DETECTED Final   Candida glabrata NOT DETECTED NOT DETECTED Final   Candida krusei NOT DETECTED NOT DETECTED Final   Candida parapsilosis NOT DETECTED NOT DETECTED Final   Candida tropicalis NOT DETECTED NOT DETECTED Final   Cryptococcus neoformans/gattii NOT DETECTED NOT DETECTED Final    Comment: Performed at [facility], [address] [place], [place] [address]  MRSA PCR Screening     Status: None   Collection Time: 04/25/20 10:00 PM   Specimen: Nasal Mucosa; Nasopharyngeal  Result Value Ref Range Status   MRSA by PCR NEGATIVE NEGATIVE Final    Comment:        The GeneXpert MRSA Assay (FDA approved for NASAL specimens only), is one component of a comprehensive MRSA colonization surveillance program. It is not intended to diagnose MRSA infection nor to guide or monitor treatment for MRSA infections. Performed at [facility], [address], [place], [place] [address]   Gastrointestinal Panel by PCR , Stool     Status: None   Collection Time: 04/28/20  7:30 AM   Specimen: Rectum; Stool  Result Value Ref Range Status   Campylobacter species NOT DETECTED NOT DETECTED Final   Plesimonas shigelloides NOT DETECTED NOT DETECTED Final   Salmonella species NOT DETECTED NOT DETECTED Final   Yersinia enterocolitica NOT DETECTED NOT DETECTED Final   Vibrio species NOT DETECTED NOT DETECTED Final   Vibrio cholerae NOT DETECTED NOT DETECTED Final   Enteroaggregative E coli (EAEC) NOT DETECTED NOT DETECTED Final   Enteropathogenic E coli (EPEC) NOT DETECTED NOT DETECTED Final   Enterotoxigenic E coli (ETEC) NOT DETECTED NOT DETECTED Final   Shiga like toxin producing E coli (STEC) NOT DETECTED NOT DETECTED Final   Shigella/Enteroinvasive E  coli (EIEC) NOT DETECTED NOT DETECTED Final   Cryptosporidium NOT DETECTED NOT  DETECTED Final   Cyclospora cayetanensis NOT DETECTED NOT DETECTED Final   Entamoeba histolytica NOT DETECTED NOT DETECTED Final   Giardia lamblia NOT DETECTED NOT DETECTED Final   Adenovirus F40/41 NOT DETECTED NOT DETECTED Final   Astrovirus NOT DETECTED NOT DETECTED Final   Norovirus GI/GII NOT DETECTED NOT DETECTED Final   Rotavirus A NOT DETECTED NOT DETECTED Final   Sapovirus (I, II, IV, and V) NOT DETECTED NOT DETECTED Final    Comment: Performed at [facility], [address] [place], [place] [address]  C Difficile Quick Screen (NO PCR Reflex)     Status: None   Collection Time: 04/28/20  7:30 AM   Specimen: Rectum; Stool  Result Value Ref Range Status   C Diff antigen NEGATIVE NEGATIVE Final   C Diff toxin NEGATIVE NEGATIVE Final   C Diff interpretation No C. difficile detected.  Final    Comment: Performed at [facility], [address], [place], [place] [address]         Radiology Studies: [facility] Chest [facility] 1 View  Result Date: 05/02/2020 CLINICAL DATA:  Shortness of breath. EXAM: PORTABLE CHEST 1 VIEW COMPARISON:  April 30, 2020. FINDINGS: Stable cardiomediastinal silhouette. Stable bilateral lung opacities are noted, right greater than left, concerning for multifocal pneumonia. No pneumothorax or significant pleural effusion is noted. Bony thorax is unremarkable. IMPRESSION: Stable bilateral lung opacities, right greater than left, concerning for multifocal pneumonia. Electronically Signed   By: Marijo Conception M.D.   On: 05/02/2020 11:12        Scheduled Meds: . allopurinol  300 mg Oral Daily  . amiodarone  200 mg Oral Daily  . vitamin C  500 mg Oral Daily  . aspirin EC  81 mg Oral Daily  . atorvastatin  80 mg Oral Daily  . benzonatate  200 mg Oral TID  . Chlorhexidine Gluconate Cloth  6 each Topical Daily  . enoxaparin (LOVENOX) injection  70 mg Subcutaneous Q24H  . famotidine  20 mg Oral BID  . feeding supplement  237 mL Oral TID BM  . furosemide   40 mg Intravenous Q12H  . insulin aspart  0-20 Units Subcutaneous TID WC  . insulin aspart  0-5 Units Subcutaneous QHS  . insulin aspart  4 Units Subcutaneous TID WC  . insulin detemir  23 Units Subcutaneous BID  . Ipratropium-Albuterol  1 puff Inhalation BID  . lactulose  20 g Oral TID  . methylPREDNISolone (SOLU-MEDROL) injection  125 mg Intravenous Q12H  . metoprolol tartrate  12.5 mg Oral BID  . multivitamin with minerals  1 tablet Oral Daily  . tamsulosin  0.4 mg Oral QPC supper  . trimethoprim-polymyxin b  1 drop Both Eyes Q6H  . zinc sulfate  220 mg Oral Daily   Continuous Infusions: . sodium chloride Stopped (04/25/20 1659)  . cefTRIAXone (ROCEPHIN)  IV 2 g (05/02/20 1224)     LOS: 12 days    Time spent: 35 minutes    Yomaris Palecek Darleen Crocker, DO [facility]  If 7PM-7AM, please contact night-coverage [email or web address] 05/02/2020, 4:01 PM

## 2020-05-02 DEATH — deceased

## 2020-05-03 DIAGNOSIS — R0902 Hypoxemia: Secondary | ICD-10-CM

## 2020-05-03 DIAGNOSIS — R0602 Shortness of breath: Secondary | ICD-10-CM

## 2020-05-03 LAB — COMPREHENSIVE METABOLIC PANEL
ALT: 43 U/L (ref 0–44)
AST: 58 U/L — ABNORMAL HIGH (ref 15–41)
Albumin: 2.1 g/dL — ABNORMAL LOW (ref 3.5–5.0)
Alkaline Phosphatase: 69 U/L (ref 38–126)
Anion gap: 12 (ref 5–15)
BUN: 46 mg/dL — ABNORMAL HIGH (ref 8–23)
CO2: 30 mmol/L (ref 22–32)
Calcium: 7.8 mg/dL — ABNORMAL LOW (ref 8.9–10.3)
Chloride: 106 mmol/L (ref 98–111)
Creatinine, Ser: 1.3 mg/dL — ABNORMAL HIGH (ref 0.61–1.24)
GFR, Estimated: 60 mL/min — ABNORMAL LOW (ref >60)
Glucose, Bld: 193 mg/dL — ABNORMAL HIGH (ref 70–99)
Potassium: 3.6 mmol/L (ref 3.5–5.1)
Sodium: 148 mmol/L — ABNORMAL HIGH (ref 135–145)
Total Bilirubin: 1.2 mg/dL (ref 0.3–1.2)
Total Protein: 6.1 g/dL — ABNORMAL LOW (ref 6.5–8.1)

## 2020-05-03 LAB — CBC
HCT: 29.9 % — ABNORMAL LOW (ref 39.0–52.0)
Hemoglobin: 8.6 g/dL — ABNORMAL LOW (ref 13.0–17.0)
MCH: 30.4 pg (ref 26.0–34.0)
MCHC: 28.8 g/dL — ABNORMAL LOW (ref 30.0–36.0)
MCV: 105.7 fL — ABNORMAL HIGH (ref 80.0–100.0)
Platelets: 132 10*3/uL — ABNORMAL LOW (ref 150–400)
RBC: 2.83 MIL/uL — ABNORMAL LOW (ref 4.22–5.81)
RDW: 16.5 % — ABNORMAL HIGH (ref 11.5–15.5)
WBC: 154.4 10*3/uL (ref 4.0–10.5)
nRBC: 0 % (ref 0.0–0.2)

## 2020-05-03 LAB — GLUCOSE, CAPILLARY: Glucose-Capillary: 184 mg/dL — ABNORMAL HIGH (ref 70–99)

## 2020-05-03 LAB — AMMONIA: Ammonia: 29 umol/L (ref 9–35)

## 2020-05-03 LAB — MAGNESIUM: Magnesium: 2.7 mg/dL — ABNORMAL HIGH (ref 1.7–2.4)

## 2020-05-03 MED ORDER — GLYCOPYRROLATE 0.2 MG/ML IJ SOLN
0.2000 mg | INTRAMUSCULAR | Status: DC | PRN
Start: 2020-05-03 — End: 2020-05-04

## 2020-05-03 MED ORDER — ACETAMINOPHEN 325 MG PO TABS: 650.0000 mg | ORAL_TABLET | Freq: Four times a day (QID) | ORAL | Status: DC | PRN

## 2020-05-03 MED ORDER — BIOTENE DRY MOUTH MT LIQD: 15.0000 mL | OROMUCOSAL | Status: DC | PRN

## 2020-05-03 MED ORDER — GLYCOPYRROLATE 0.2 MG/ML IJ SOLN
0.2000 mg | INTRAMUSCULAR | Status: DC | PRN
  Administered 2020-05-03: 0.2 mg via INTRAVENOUS
  Filled 2020-05-03: qty 1

## 2020-05-03 MED ORDER — POLYVINYL ALCOHOL 1.4 % OP SOLN: 1.0000 [drp] | Freq: Four times a day (QID) | OPHTHALMIC | Status: DC | PRN

## 2020-05-03 MED ORDER — MORPHINE 100MG IN NS 100ML (1MG/ML) PREMIX INFUSION
1.0000 mg/h | INTRAVENOUS | Status: DC
  Administered 2020-05-03: 2 mg/h via INTRAVENOUS
  Filled 2020-05-03: qty 100

## 2020-05-03 MED ORDER — ACETAMINOPHEN 650 MG RE SUPP: 650.0000 mg | Freq: Four times a day (QID) | RECTAL | Status: DC | PRN

## 2020-05-03 MED ORDER — GLYCOPYRROLATE 1 MG PO TABS: 1.0000 mg | ORAL_TABLET | ORAL | Status: DC | PRN

## 2020-05-05 LAB — CULTURE, BLOOD (ROUTINE X 2): Special Requests: ADEQUATE

## 2020-05-07 LAB — CULTURE, BLOOD (ROUTINE X 2)
Culture: NO GROWTH
Special Requests: ADEQUATE

## 2020-05-30 NOTE — Discharge Summary (Signed)
Death Summary  Tim Duncan JOA:[id number] DOB: [birth year] DOA: May 03, 2020  PCP: Celene Squibb, MD PCP/Office notified: contacted through [place] date: 05/24/2020 Date of Death: 16-May-2020  Final Diagnoses:  Acute hypoxemic respiratory failure secondary to COVID-19 pneumonia Streptococcus pneumoniae bacteremia E. coli and Proteus mirabilis UTI Acute encephalopathy Acute kidney injury Hypertension History of BPH Morbid obesity Type 2 diabetes mellitus Paroxysmal atrial fibrillation Hyperlipidemia Gastroesophageal reflux disease History of chronic lymphocytic leukemia   History of present illness:    Hospital Course:  Acute hypoxemic respiratory failure ongoing -patient in the requiring up to 15L high flow nasal cannula oxygen supplementation along with with NRB mask. -Oxygen saturation remains in the mid 80s -Patient completed treatment with remdesivir. -Was on a steroids and baricitinib. -He failed to improved despite aggressive intervention. Following discussion of GOC and advance directives, decision was made to transition to full comfort care. -Patient prognosis was very poor and meaningful recovery practically non existent  -all meds were stopped and patient started on morphine drip, Ativan, Robinul and other symptomatic management intervention. -hospital death was anticipated. -he was DNR/DNI -Time of death 4:25 PM.  Acute encephalopathy -multifactorial, was improving -Appears to be related to hypoxia and also with some mild elevation of ammonia -TSH within normal limits -Likely related to critical illness -Plan was to transition to full comfort care.  Strep pneumonia bacteremia with ongoing fevers -Treated with Rocephin on transition to comfort care management. -Patient continued spiking fever and requiring high amount of oxygen supplementation.  E. coli and Proteus UTI -patient was receiving Rocephin. -Stopping antibiotics now as plan otherwise was to  transition into comfort care.  Ongoing diarrhea -was improving -C. difficile negative and GI panelnegative -Continue symptomatic management and comfort care  History of paroxysmal atrial fibrillation -Sinus rhythm and rate controlled -Continue b-blocker to avoid rebound tachycardia. -Comfort care and symptomatic management as main goal of therapy.  CLL -WBCs in the150s range currently -Continue to follow trendand continue to monitor procalcitonin for better guidance which has been downtrending -Case discussed with oncology service (Dr. Delton Coombes) and recommendations given toholdvenetoclax while acutely ill; may resume on dc. -plan is for full comfort care at this point -no further blood work anticipated. -symptomatic management and comfort care only.  Type 2 diabetes-with hyperglycemia -planning transition to full comfort care -no further CBG's monitoring   Gastroesophageal reflux disease/GI prophylaxis -Continue H2 blocker  Essential hypertension -Currently stable and well-controlled -Patient transitioned into comfort care.  Acute kidney injury -was improving with IVF's. -Urine outputadequate -In the setting of dehydration and prerenal azotemia -Urine culture isolated > 100,000 colonies of E. Coli -also > 50,000 colonies of proteus mirabilis -no dysuria -Transitioned to comfort care and symptomatic management only   Hyperlipidemia -patient's statins discontinue as plan of care has been transitioned to symptomatic management only; and he was having difficulty taking oral medication.  History of BPH -Patient was kept on Flomax; now having difficulties tolerating oral medications. -Focusing on comfort care.  Morbid obesity -Body mass index is 42.01 kg/m. -Low-calorie diet, portion control increase physical activity discussed with patient.    Time: 25 minutes  Signed:  Barton Dubois  [facility] 2020/05/16, 4:31 PM

## 2020-05-30 NOTE — Progress Notes (Addendum)
PROGRESS NOTE    Tim Duncan  YNW:[id number] DOB: [birth year] DOA: 2020-04-29 PCP: Celene Squibb, MD   Brief Narrative:  Tim Grassia Moyeris a 70 y.o.malewith medical history significant ofhypertension, BPH, atrial fibrillation (not chronically on anticoagulation), chronic lymphocytic leukemia (actively follow by oncology service at [facility]), hyperlipidemia, morbid obesity and diabetes; who presented to the emergency department secondary to general malaise, shortness of breath and intermittent coughing spells. Patient symptoms have been present for the last 2 days or so and was tested and found to be positive for COVID. He is a skilled nursing facility oxygen saturation on room air was around 75%. Patient reports chronic diarrhea, no nausea or vomiting. Expressed having chills. Nonproductive coughing spells and shortness of breath at rest on exertion. No hemoptysis. Of note, patient expressed to be vaccinated with Wynetta Emery & Wynetta Emery vaccine in the past.   ED Course:Inflammatory markers corroborating acute COVID infection; bilateral infiltrates appreciated on all his lungs characteristic for COVID. IV steroids and remdesivir's initiated. [facility] has been contacted to place patient in the hospital for acute respiratory failure with hypoxia in the setting of COVID-pneumonia. Patientwasrequiring 4 L nasal cannula supplementation.   Assessment & Plan:  Acute hypoxemic respiratory failure ongoing -Currently on 15L nasal cannula oxygen with NRB mask -Patient completed treatment with remdesivir -Was on a steroids and baricitinib -Fail to improved; following discussion of GOC and advance directives decision made to transition to full comfort care -prognosis was very poor and meaningful recovery practically non existent  -all meds will be stopped and patient will be started on morphine drip -hospital death anticipated. -he was DNR/DNI  Acute encephalopathy -multifactorial, was  improving -Appears to be related to hypoxia and also with some mild elevation of ammonia -TSH within normal limits -Likely related to critical illness -Planning to transition to full comfort care.  Strep pneumonia bacteremia with ongoing fevers -Treated with Rocephin on transition to comfort care management. -Patient continued spiking fever requiring high amount of oxygen supplementation.  E. coli and Proteus UTI -patient was receiving Rocephin. -Stopping antibiotics now otherwise transitioning into comfort care.  Ongoing diarrhea -was improving -C. difficile negative and GI panelnegative  History of paroxysmal atrial fibrillation -Sinus rhythm and rate controlled -Continue b-blocker to avoid rebound tachycardia.  CLL -WBCs in the150s range currently -Continue to follow trendand continue to monitor procalcitonin for better guidance which has been downtrending -Case discussed with oncology service (Dr. Delton Coombes) and recommendations given toholdvenetoclax while acutely ill; may resume on dc. -plan is for full comfort care at this point -no further blood work anticipated. -symptomatic management only  Type 2 diabetes-with hyperglycemia -planning transition to full comfort care -no further CBG's monitoring   Gastroesophageal reflux disease/GI prophylaxis -Continue H2 blocker  Essential hypertension -Currently stable and well-controlled  Acute kidney injury -was improving with IVF's. -Urine outputadequate -In the setting of dehydration and prerenal azotemia -Urine culture isolated > 100,000 colonies of E. Coli -also > 50,000 colonies of proteus mirabilis -no dysuria -symptomatic management only  -transitioning to comfort care  Hyperlipidemia -patient's statins discontinue as plan of care has been transitioned to symptomatic management only; and he was having difficulty taking oral medication.  History of BPH -Patient was kept on Flomax; now having  difficulties tolerating oral medications. -We will focus on comfort care.  Morbid obesity -Body mass index is 42.01 kg/m. -Low-calorie diet, portion control increase physical activity discussed with patient.   DVT prophylaxis:Lovenox Code Status:DNR/DNI Family Communication:HCPOA updated in full details by palliative care  service; physically present during transition to symptomatic management and initiation of morphine drip. Disposition:  Status is: Inpatient  Dispo: The patient is from: Skilled nursing facility Anticipated d/c is to: Anticipated hospital death. Anticipated d/c date is: Anticipated hospital death. Patient currentlynotmedically stable for discharge; still requiring significant amount of oxygen supplementation. Complaining of feeling short winded and receiving IV therapy. Patient has failed to improve; after further discussing with her palliative care [place] and advance directive decision has been made to pursued symptomatic management and transition to full comfort.   Consultants:  Oncology service curbside: Dr. Delton Coombes.  Palliative care   Procedures: See below for x-ray reports  Antimicrobials:  Anti-infectives (From admission, onward)   Start     Dose/Rate Route Frequency Ordered Stop   04/26/20 1200  cefTRIAXone (ROCEPHIN) 2 g in sodium chloride 0.9 % 100 mL IVPB  Status:  Discontinued        2 g 200 mL/hr over 30 Minutes Intravenous Every 24 hours 04/26/20 0848 05/26/20 1146   04/25/20 1630  piperacillin-tazobactam (ZOSYN) IVPB 3.375 g  Status:  Discontinued        3.375 g 12.5 mL/hr over 240 Minutes Intravenous Every 8 hours 04/25/20 1551 04/26/20 0848   04/21/20 1000  remdesivir 100 mg in sodium chloride 0.9 % 100 mL IVPB        100 mg 200 mL/hr over 30 Minutes Intravenous Daily 05/01/2020 1108 04/24/20 1835   04/21/20 1000  remdesivir 100 mg in sodium chloride 0.9 % 100 mL IVPB  Status:   Discontinued       "Followed by" Linked Group Details   100 mg 200 mL/hr over 30 Minutes Intravenous Daily 04/13/2020 1139 04/16/2020 1147   04/14/2020 1200  remdesivir 100 mg in sodium chloride 0.9 % 100 mL IVPB        100 mg 200 mL/hr over 30 Minutes Intravenous Every 1 hr x 2 04/19/2020 1108 04/10/2020 1315   04/09/2020 1145  remdesivir 200 mg in sodium chloride 0.9% 250 mL IVPB  Status:  Discontinued       "Followed by" Linked Group Details   200 mg 580 mL/hr over 30 Minutes Intravenous Once 04/28/2020 1139 04/28/2020 1147   04/11/2020 1141  valACYclovir (VALTREX) tablet 1,000 mg  Status:  Discontinued        1,000 mg Oral Daily PRN 04/13/2020 1142 05-26-2020 1146      Subjective: Continued spiking fever; requiring high amount of oxygen supplementation and in moderate respiratory distress.  Objective: Vitals:   2020-05-26 1200 26-May-2020 1300 05-26-2020 1400 May 26, 2020 1500  BP: (!) 180/72     Pulse: (!) 122 (!) 116 (!) 113 (!) 113  Resp: (!) 38 (!) 38 (!) 32 20  Temp:      TempSrc:      SpO2: (!) 84% (!) 87% (!) 87% (!) 33%  Weight:      Height:        Intake/Output Summary (Last 24 hours) at 05-26-2020 1525 Last data filed at May 26, 2020 0340 Gross per 24 hour  Intake -  Output 1775 ml  Net -1775 ml   Filed Weights   05/01/20 0500 05/02/20 0430 2020/05/26 0340  Weight: (!) 137.3 kg (!) 137.5 kg 132.8 kg    Examination: General exam: In moderate distress; requiring significant amount of oxygen supplementation, still spiking fever and failing to make progress. Respiratory system: Diffuse rhonchi bilaterally; no wheezing, positive tachypnea mild use of accessory muscles.  Patient saturation in the mid 80s despite the  use of 15 L nasal cannula along with nonrebreather. Cardiovascular system: Sinus tachycardia, no rubs, no gallops, unable to properly assess JVD. Gastrointestinal system: Abdomen is obese, nondistended, soft and nontender.  Positive bowel sounds; nonincarcerated ventral hernia  appreciated. Central nervous system: No focal neurological deficits. Extremities: No cyanosis or clubbing.  Trace edema bilaterally appreciated. Skin: No petechiae. Psychiatry: Judgement and insight appear normal.  Flat affect.   Data Reviewed: I have personally reviewed following labs and imaging studies  CBC: Recent Labs  Lab 04/27/20 0430 04/28/20 0621 04/29/20 0639 04/30/20 0500 05/01/20 0552 05/02/20 0522 05/10/2020 0455  WBC 98.9* 110.8* 119.4* 108.1* 135.2* 134.6* 154.4*  NEUTROABS 14.8* 12.2* 10.7* 8.6* 9.5*  --   --   HGB 8.3* 9.6* 8.4* 8.0* 8.2* 7.8* 8.6*  HCT 28.6* 32.9* 29.2* 28.6* 29.0* 28.1* 29.9*  MCV 104.8* 107.5* 107.0* 107.9* 107.8* 107.3* 105.7*  PLT 152 143* 134* 120* 129* 117* 106*   Basic Metabolic Panel: Recent Labs  Lab 04/29/20 0639 04/30/20 0500 05/01/20 0552 05/02/20 0522 05/28/2020 0455  NA 141 142 140 145 148*  K 3.9 4.5 3.9 4.1 3.6  CL 107 108 106 110 106  CO2 24 26 24 26 30   GLUCOSE 173* 329* 292* 248* 193*  BUN 47* 52* 43* 38* 46*  CREATININE 1.66* 1.59* 1.31* 1.12 1.30*  CALCIUM 7.6* 7.7* 7.7* 7.8* 7.8*  MG 3.3* 3.5* 3.1* 2.8* 2.7*   GFR: Estimated Creatinine Clearance: 76.7 mL/min (A) (by C-G formula based on SCr of 1.3 mg/dL (H)).   Liver Function Tests: Recent Labs  Lab 04/29/20 0639 04/30/20 0500 05/01/20 0552 05/02/20 0522 05/13/2020 0455  AST 38 38 41 47* 58*  ALT 29 29 31  36 43  ALKPHOS 60 55 61 64 69  BILITOT 0.8 0.8 0.7 0.9 1.2  PROT 6.2* 5.7* 5.8* 5.8* 6.1*  ALBUMIN 2.1* 1.9* 2.1* 1.9* 2.1*   No results for input(s): LIPASE, AMYLASE in the last 168 hours.   Recent Labs  Lab 04/29/20 0639 04/30/20 0500 05/01/20 0552 05/02/20 0522 05/07/2020 0455  AMMONIA 39* 35 36* 39* 29   CBG: Recent Labs  Lab 05/02/20 0807 05/02/20 1218 05/02/20 1643 05/02/20 1955 05/11/2020 1134  GLUCAP 234* 264* 218* 137* 184*   Anemia Panel: Recent Labs    05/01/20 0552  FERRITIN 1,931*   Sepsis Labs: Recent Labs  Lab  04/27/20 0430 04/28/20 0621 04/29/20 0639 04/30/20 0500  PROCALCITON 6.86 4.54 2.83 1.08    Recent Results (from the past 240 hour(s))  Urine Culture     Status: Abnormal   Collection Time: 04/25/20 11:40 AM   Specimen: Urine, Clean Catch  Result Value Ref Range Status   Specimen Description   Final    URINE, CLEAN CATCH Performed at [facility], [address], [place], [place] [address]    Special Requests   Final    NONE Performed at [facility], [address], [place], [place] [address]    Culture (A)  Final    >=100,000 COLONIES/mL ESCHERICHIA COLI 50,000 COLONIES/mL PROTEUS MIRABILIS    Report Status 04/28/2020 FINAL  Final   Organism ID, Bacteria ESCHERICHIA COLI (A)  Final   Organism ID, Bacteria PROTEUS MIRABILIS (A)  Final      Susceptibility   Escherichia coli - MIC*    AMPICILLIN 4 SENSITIVE Sensitive     CEFAZOLIN <=4 SENSITIVE Sensitive     CEFEPIME <=0.12 SENSITIVE Sensitive     CEFTRIAXONE <=0.25 SENSITIVE Sensitive     CIPROFLOXACIN <=0.25  SENSITIVE Sensitive     GENTAMICIN <=1 SENSITIVE Sensitive     IMIPENEM <=0.25 SENSITIVE Sensitive     NITROFURANTOIN <=16 SENSITIVE Sensitive     TRIMETH/SULFA <=20 SENSITIVE Sensitive     AMPICILLIN/SULBACTAM <=2 SENSITIVE Sensitive     PIP/TAZO <=4 SENSITIVE Sensitive     * >=100,000 COLONIES/mL ESCHERICHIA COLI   Proteus mirabilis - MIC*    AMPICILLIN <=2 SENSITIVE Sensitive     CEFAZOLIN <=4 SENSITIVE Sensitive     CEFEPIME <=0.12 SENSITIVE Sensitive     CEFTRIAXONE <=0.25 SENSITIVE Sensitive     CIPROFLOXACIN <=0.25 SENSITIVE Sensitive     GENTAMICIN <=1 SENSITIVE Sensitive     IMIPENEM 2 SENSITIVE Sensitive     NITROFURANTOIN 128 RESISTANT Resistant     TRIMETH/SULFA <=20 SENSITIVE Sensitive     AMPICILLIN/SULBACTAM <=2 SENSITIVE Sensitive     PIP/TAZO <=4 SENSITIVE Sensitive     * 50,000 COLONIES/mL PROTEUS MIRABILIS  Culture, blood (Routine X 2) w Reflex to ID Panel     Status: Abnormal   Collection  Time: 04/25/20 12:46 PM   Specimen: BLOOD  Result Value Ref Range Status   Specimen Description   Final    BLOOD LEFT ANTECUBITAL Performed at [facility], [address] [place], [place] [address]    Special Requests   Final    Blood Culture adequate volume BOTTLES DRAWN AEROBIC AND ANAEROBIC Performed at [facility], [address], [place], [place] [address]    Culture  Setup Time   Final    IN BOTH AEROBIC AND ANAEROBIC BOTTLES GRAM POSITIVE COCCI IN PAIRS Gram Stain Report Called to,Read Back By and Verified With: J HEARN,RN@0016  04/26/20 MKELLY CRITICAL VALUE NOTED.  VALUE IS CONSISTENT WITH PREVIOUSLY REPORTED AND CALLED VALUE.    Culture (A)  Final    STREPTOCOCCUS PNEUMONIAE SUSCEPTIBILITIES PERFORMED ON PREVIOUS CULTURE WITHIN THE LAST 5 DAYS. Performed at [facility], [place] [address] [place], [place] [address]    Report Status 04/28/2020 FINAL  Final  Culture, blood (Routine X 2) w Reflex to ID Panel     Status: Abnormal   Collection Time: 04/25/20 12:47 PM   Specimen: BLOOD  Result Value Ref Range Status   Specimen Description   Final    BLOOD LEFT ANTECUBITAL Performed at [facility], [address], [place], [place] [address]    Special Requests   Final    BOTTLES DRAWN AEROBIC AND ANAEROBIC Blood Culture results may not be optimal due to an inadequate volume of blood received in culture bottles Performed at [facility], [address], [place], [place] [address]    Culture  Setup Time   Final    AEROBIC BOTTLE ONLY GRAM POSITIVE COCCI IN PAIRS Gram Stain Report Called to,Read Back By and Verified With: J HEARN,RN 0016 04/26/20 MKELLY CRITICAL RESULT CALLED TO, READ BACK BY AND VERIFIED WITH: J,HEARN RN @0504  04/26/20 EB Performed at [facility], [place] [address] [place], [place] [address]    Culture STREPTOCOCCUS PNEUMONIAE (A)  Final   Report Status 04/28/2020 FINAL  Final   Organism ID, Bacteria STREPTOCOCCUS PNEUMONIAE  Final       Susceptibility   Streptococcus pneumoniae - MIC*    ERYTHROMYCIN <=0.12 SENSITIVE Sensitive     LEVOFLOXACIN 0.5 SENSITIVE Sensitive     VANCOMYCIN 0.5 SENSITIVE Sensitive     PENO - penicillin <=0.06      PENICILLIN (non-meningitis) <=0.06 SENSITIVE Sensitive     PENICILLIN (oral) <=0.06  SENSITIVE Sensitive     CEFTRIAXONE (non-meningitis) <=0.12 SENSITIVE Sensitive     * STREPTOCOCCUS PNEUMONIAE  Blood Culture ID Panel (Reflexed)     Status: Abnormal   Collection Time: 04/25/20 12:47 PM  Result Value Ref Range Status   Enterococcus faecalis NOT DETECTED NOT DETECTED Final   Enterococcus Faecium NOT DETECTED NOT DETECTED Final   Listeria monocytogenes NOT DETECTED NOT DETECTED Final   Staphylococcus species NOT DETECTED NOT DETECTED Final   Staphylococcus aureus (BCID) NOT DETECTED NOT DETECTED Final   Staphylococcus epidermidis NOT DETECTED NOT DETECTED Final   Staphylococcus lugdunensis NOT DETECTED NOT DETECTED Final   Streptococcus species DETECTED (A) NOT DETECTED Final    Comment: CRITICAL RESULT CALLED TO, READ BACK BY AND VERIFIED WITH: J,HEARN RN @0504  04/26/20 EB    Streptococcus agalactiae NOT DETECTED NOT DETECTED Final   Streptococcus pneumoniae DETECTED (A) NOT DETECTED Final    Comment: CRITICAL RESULT CALLED TO, READ BACK BY AND VERIFIED WITH: J,HEARN RN @0504  04/26/20 EB    Streptococcus pyogenes NOT DETECTED NOT DETECTED Final   A.calcoaceticus-baumannii NOT DETECTED NOT DETECTED Final   Bacteroides fragilis NOT DETECTED NOT DETECTED Final   Enterobacterales NOT DETECTED NOT DETECTED Final   Enterobacter cloacae complex NOT DETECTED NOT DETECTED Final   Escherichia coli NOT DETECTED NOT DETECTED Final   Klebsiella aerogenes NOT DETECTED NOT DETECTED Final   Klebsiella oxytoca NOT DETECTED NOT DETECTED Final   Klebsiella pneumoniae NOT DETECTED NOT DETECTED Final   Proteus species NOT DETECTED NOT DETECTED Final   Salmonella species NOT DETECTED NOT DETECTED  Final   Serratia marcescens NOT DETECTED NOT DETECTED Final   Haemophilus influenzae NOT DETECTED NOT DETECTED Final   Neisseria meningitidis NOT DETECTED NOT DETECTED Final   Pseudomonas aeruginosa NOT DETECTED NOT DETECTED Final   Stenotrophomonas maltophilia NOT DETECTED NOT DETECTED Final   Candida albicans NOT DETECTED NOT DETECTED Final   Candida auris NOT DETECTED NOT DETECTED Final   Candida glabrata NOT DETECTED NOT DETECTED Final   Candida krusei NOT DETECTED NOT DETECTED Final   Candida parapsilosis NOT DETECTED NOT DETECTED Final   Candida tropicalis NOT DETECTED NOT DETECTED Final   Cryptococcus neoformans/gattii NOT DETECTED NOT DETECTED Final    Comment: Performed at [facility], [address] [place], [place] [address]  MRSA PCR Screening     Status: None   Collection Time: 04/25/20 10:00 PM   Specimen: Nasal Mucosa; Nasopharyngeal  Result Value Ref Range Status   MRSA by PCR NEGATIVE NEGATIVE Final    Comment:        The GeneXpert MRSA Assay (FDA approved for NASAL specimens only), is one component of a comprehensive MRSA colonization surveillance program. It is not intended to diagnose MRSA infection nor to guide or monitor treatment for MRSA infections. Performed at [facility], [address], [place], [place] [address]   Gastrointestinal Panel by PCR , Stool     Status: None   Collection Time: 04/28/20  7:30 AM   Specimen: Rectum; Stool  Result Value Ref Range Status   Campylobacter species NOT DETECTED NOT DETECTED Final   Plesimonas shigelloides NOT DETECTED NOT DETECTED Final   Salmonella species NOT DETECTED NOT DETECTED Final   Yersinia enterocolitica NOT DETECTED NOT DETECTED Final   Vibrio species NOT DETECTED NOT DETECTED Final   Vibrio cholerae NOT DETECTED NOT DETECTED Final   Enteroaggregative E coli (EAEC) NOT DETECTED NOT DETECTED Final   Enteropathogenic E coli (EPEC) NOT DETECTED  NOT DETECTED Final   Enterotoxigenic E coli (ETEC)  NOT DETECTED NOT DETECTED Final   Shiga like toxin producing E coli (STEC) NOT DETECTED NOT DETECTED Final   Shigella/Enteroinvasive E coli (EIEC) NOT DETECTED NOT DETECTED Final   Cryptosporidium NOT DETECTED NOT DETECTED Final   Cyclospora cayetanensis NOT DETECTED NOT DETECTED Final   Entamoeba histolytica NOT DETECTED NOT DETECTED Final   Giardia lamblia NOT DETECTED NOT DETECTED Final   Adenovirus F40/41 NOT DETECTED NOT DETECTED Final   Astrovirus NOT DETECTED NOT DETECTED Final   Norovirus GI/GII NOT DETECTED NOT DETECTED Final   Rotavirus A NOT DETECTED NOT DETECTED Final   Sapovirus (I, II, IV, and V) NOT DETECTED NOT DETECTED Final    Comment: Performed at [facility], [address] [place], [place] [address]  C Difficile Quick Screen (NO PCR Reflex)     Status: None   Collection Time: 04/28/20  7:30 AM   Specimen: Rectum; Stool  Result Value Ref Range Status   C Diff antigen NEGATIVE NEGATIVE Final   C Diff toxin NEGATIVE NEGATIVE Final   C Diff interpretation No C. difficile detected.  Final    Comment: Performed at [facility], [address] [place], [place] [address]  Culture, blood (routine x 2)     Status: None (Preliminary result)   Collection Time: 05/02/20  5:00 PM   Specimen: BLOOD LEFT ARM  Result Value Ref Range Status   Specimen Description BLOOD LEFT ARM  Final   Special Requests   Final    BOTTLES DRAWN AEROBIC AND ANAEROBIC Blood Culture adequate volume   Culture   Final    NO GROWTH < 24 HOURS Performed at [facility], [address] [place], [place] [address]    Report Status PENDING  Incomplete  Culture, blood (routine x 2)     Status: None (Preliminary result)   Collection Time: 05/02/20  5:10 PM   Specimen: BLOOD RIGHT HAND  Result Value Ref Range Status   Specimen Description BLOOD RIGHT HAND  Final   Special Requests   Final    BOTTLES DRAWN AEROBIC AND ANAEROBIC Blood Culture adequate volume   Culture   Final    NO GROWTH < 24  HOURS Performed at [facility], [address], [place], [place] [address]    Report Status PENDING  Incomplete    Radiology Studies: DG Chest Port 1 View  Result Date: 05/02/2020 CLINICAL DATA:  Shortness of breath. EXAM: PORTABLE CHEST 1 VIEW COMPARISON:  April 30, 2020. FINDINGS: Stable cardiomediastinal silhouette. Stable bilateral lung opacities are noted, right greater than left, concerning for multifocal pneumonia. No pneumothorax or significant pleural effusion is noted. Bony thorax is unremarkable. IMPRESSION: Stable bilateral lung opacities, right greater than left, concerning for multifocal pneumonia. Electronically Signed   By: Marijo Conception M.D.   On: 05/02/2020 11:12    Scheduled Meds: . Ipratropium-Albuterol  1 puff Inhalation BID  . methylPREDNISolone (SOLU-MEDROL) injection  125 mg Intravenous Q12H  . metoprolol tartrate  12.5 mg Oral BID  . trimethoprim-polymyxin b  1 drop Both Eyes Q6H   Continuous Infusions: . sodium chloride Stopped (04/25/20 1659)  . morphine 4 mg/hr (31-May-2020 1410)     LOS: 13 days    Time spent: 35 minutes    Barton Dubois, MD [facility]  If 7PM-7AM, please contact night-coverage [email or web address] May 31, 2020, 3:25 PM

## 2020-05-30 NOTE — Progress Notes (Signed)
Emptied more rain-out from [place] line.  Put more ice in ice packs and placed under patient's arms to help with fever.  Swabbed patient's mouth to help with mouth dryness.

## 2020-05-30 NOTE — Progress Notes (Signed)
Patient had bloody secretions come out right nares.  Had to drain right much rain-out of [facility] line as well.  Changed temp on HHF.  Will continue to monitor.

## 2020-05-30 NOTE — Progress Notes (Signed)
Palliative:   Tim Duncan is lying quietly in bed.  He appears acutely/chronically ill and very frail.  He is having respiratory distress with nonrebreather and high flow nasal cannula at 100% FiO2, with saturations in the mid to upper 80s.  He will open his eyes, but not really make eye contact.  There is no family at bedside at this time due to visitor restriction.  We talk about his CLL and Covid pneumonia.  We talked about focusing on comfort and dignity, and burdening him from painful treatments that are changing what is happening.  He clearly agrees to comfort measures.  Tim Duncan asks for water.  I poor a few cc in a medicine cup, and give him water.  He clearly aspirates.  Call to friend/HC POA, Tim Duncan.  We talked about Tim Duncan's choice for comfort and dignity.  Tim Duncan states that she can be here in around 2-1/2 hours.  Conference with attending, bedside nursing staff, transition of care team related to patient condition, needs, goals of care.  Plan: Full comfort care once friend/HP COA, Tim Duncan arrives.  Anticipate need for morphine continuous infusion. Prognosis: Hours, anticipate hospital death.  77 minutes Tim Axe, NP Palliative medicine team Team phone [phone] Greater than 50% of this time was spent counseling and coordinating care related to the above assessment and plan.

## 2020-05-30 NOTE — Progress Notes (Signed)
SLP Cancellation Note  Patient Details Name: ANDRES VEST MRN: [id number] DOB: 09/25/1950   Cancelled treatment:       Reason Eval/Treat Not Completed: Patient not medically ready (RN reports not tolerating po meds in puree, clinically inappropriate); Recommend NPO at this time due to significant aspiration risks. SLP will continue to follow for appropriateness. Continue oral care and ice chips for comfort, medication via alternative means.  Thank you,  Genene Churn, [place]    [place] 05/28/2020, 11:40 AM

## 2020-05-30 DEATH — deceased

## 2020-06-01 ENCOUNTER — Ambulatory Visit: Payer: Medicare (Managed Care) | Admitting: Podiatry
